# Patient Record
Sex: Male | Born: 1935 | Race: White | Hispanic: No | Marital: Married | Smoking: Never smoker
Health system: Southern US, Community
[De-identification: ages and names within clinical notes are randomized; demographics above are authoritative.]

## PROBLEM LIST (undated history)

## (undated) ENCOUNTER — Inpatient Hospital Stay: Admission: EM | Payer: Self-pay | Source: Home / Self Care

## (undated) DIAGNOSIS — K219 Gastro-esophageal reflux disease without esophagitis: Secondary | ICD-10-CM

## (undated) DIAGNOSIS — H9190 Unspecified hearing loss, unspecified ear: Secondary | ICD-10-CM

## (undated) DIAGNOSIS — E785 Hyperlipidemia, unspecified: Secondary | ICD-10-CM

## (undated) DIAGNOSIS — I499 Cardiac arrhythmia, unspecified: Secondary | ICD-10-CM

## (undated) DIAGNOSIS — G250 Essential tremor: Secondary | ICD-10-CM

## (undated) DIAGNOSIS — M1990 Unspecified osteoarthritis, unspecified site: Secondary | ICD-10-CM

## (undated) DIAGNOSIS — J029 Acute pharyngitis, unspecified: Secondary | ICD-10-CM

## (undated) DIAGNOSIS — I6529 Occlusion and stenosis of unspecified carotid artery: Secondary | ICD-10-CM

## (undated) DIAGNOSIS — N400 Enlarged prostate without lower urinary tract symptoms: Secondary | ICD-10-CM

## (undated) HISTORY — PX: CATARACT EXTRACTION: SUR2

## (undated) HISTORY — DX: Hyperlipidemia, unspecified: E78.5

## (undated) HISTORY — PX: TOTAL KNEE ARTHROPLASTY: SHX125

## (undated) HISTORY — PX: TONSILLECTOMY: SUR1361

## (undated) HISTORY — DX: Unspecified osteoarthritis, unspecified site: M19.90

## (undated) HISTORY — DX: Occlusion and stenosis of unspecified carotid artery: I65.29

## (undated) HISTORY — DX: Acute pharyngitis, unspecified: J02.9

## (undated) HISTORY — DX: Benign prostatic hyperplasia without lower urinary tract symptoms: N40.0

## (undated) HISTORY — DX: Essential tremor: G25.0

## (undated) NOTE — *Deleted (*Deleted)
Stat EKG obtained

---

## 2000-11-26 ENCOUNTER — Encounter: Admission: RE | Admit: 2000-11-26 | Discharge: 2000-11-26 | Payer: Self-pay | Admitting: Internal Medicine

## 2002-11-14 ENCOUNTER — Encounter: Payer: Self-pay | Admitting: *Deleted

## 2003-09-15 ENCOUNTER — Ambulatory Visit (HOSPITAL_COMMUNITY): Admission: RE | Admit: 2003-09-15 | Discharge: 2003-09-15 | Payer: Self-pay | Admitting: Internal Medicine

## 2004-08-23 ENCOUNTER — Ambulatory Visit: Payer: Self-pay | Admitting: Cardiology

## 2007-02-26 ENCOUNTER — Ambulatory Visit: Payer: Self-pay | Admitting: Internal Medicine

## 2007-02-26 ENCOUNTER — Ambulatory Visit (HOSPITAL_COMMUNITY): Admission: RE | Admit: 2007-02-26 | Discharge: 2007-02-26 | Payer: Self-pay | Admitting: Internal Medicine

## 2011-02-21 NOTE — Op Note (Signed)
NAME:  Jeffery Smith, Jeffery Smith NO.:  [id number]   MEDICAL RECORD NO.:  [id number]          PATIENT TYPE:  AMB   LOCATION:  DAY                           FACILITY:  [facility]   PHYSICIAN:  Lionel December, M.D.    DATE OF BIRTH:  04-26-1925   DATE OF PROCEDURE:  02/26/2007  DATE OF DISCHARGE:  02/26/2007                               OPERATIVE REPORT   PROCEDURE:  Colonoscopy.   INDICATIONS:  Jeffery Smith is a 77 year old Caucasian male with a first  surveillance colonoscopy.  He had an exam in December, 2004 with removal  of large tubulovillous adenoma.  Family history is negative for  colorectal carcinoma.  The procedural risks were reviewed with the  patient, and informed consent was obtained.   MEDS FOR CONSCIOUS SEDATION:  Demerol 50 mg IV, Versed 3 mg IV.   FINDINGS:  Procedure performed in endoscopy suite.  Patient's vital  signs and O2 sat were monitored during the procedure and remained  stable.  Patient was placed in the left lateral decubitus position and  rectal examination performed.  No abnormality noted on external or  digital exam.  Pentax videoscope was placed in the rectum and advanced  under direct vision into the sigmoid colon and beyond.  Preparation was  excellent.  The scope was passed at the cecum.  The blind cecum was  carefully examined, the site where previous polypectomy had been  performed.  It was clean and free of any polyp.  Pictures were taken for  the record.  Pictures also taken of the appendiceal orifice and  ileocecal valve.  As the scope was withdrawn, colonic mucosa was  carefully examined and was normal throughout.  Rectal mucosa similarly  was normal.  The scope was retroflexed to examine anorectal junction.  There was some focal thickening to mucosa and the dentate line,  otherwise normal.  The colonic mucosa was carefully examined.  It was  normal throughout.  There were a few small diverticula noted in the  sigmoid colon.  Rectal mucosa was  normal.  The scope was retroflexed to  examine anorectal junction, and he had small hemorrhoids below the  dentate line.  The endoscope was straightened and withdrawn.  Patient  tolerated the procedure well.   FINAL DIAGNOSES:  1. No evidence of recurrent polyps.  2. A few diverticula at the sigmoid colon, small external hemorrhoids.   RECOMMENDATIONS:  Patient may consider next exam in five years from now.      Lionel December, M.D.  Electronically Signed     NR/MEDQ  D:  03/12/2007  T:  03/12/2007  Job:  [id number]

## 2011-02-24 NOTE — Op Note (Signed)
NAME:  Jeffery Smith, Jeffery Smith                    ACCOUNT NO.:  [id number]   MEDICAL RECORD NO.:  [id number]                   PATIENT TYPE:  AMB   LOCATION:  DAY                                  FACILITY:  [facility]   PHYSICIAN:  Lionel December, M.D.                 DATE OF BIRTH:  09-06-1925   DATE OF PROCEDURE:  09/15/2003  DATE OF DISCHARGE:                                 OPERATIVE REPORT   PROCEDURE:  Total colonoscopy with polypectomy.   INDICATIONS FOR PROCEDURE:  Merlyn Albert is a 77 year old Caucasian male who is  undergoing screening colonoscopy.  The procedure and risks were reviewed  with the patient, and informed consent was obtained.   PREOPERATIVE MEDICATIONS:  Demerol 15 mg IV, atropine 0.3 mg IV, and Versed  3 mg IV.   FINDINGS:  The procedure was performed in the endoscopy suite.  The  patient's vital signs and O2 saturations were monitored during the  procedure.  He had intermittent single PVCs.  He had a transient  asymptomatic drop in his blood pressure, and he was given a small dose of  atropine to prevent vasovagal phenomenon.  The patient was placed in the  left lateral recumbent position and rectal examination performed.  No  abnormality noted on external or digital exam.  The Olympus videoscope was  placed into the rectum and advanced into the region of the sigmoid colon.  Some difficulty was encountered in passing the scope from the sigmoid to the  descending colon.  Further intubation was easy.  The preparation was  satisfactory.  A few small diverticula were noted at the sigmoid colon.  The  scope was passed to the cecum which was identified by the ileocecal valve  and appendiceal stump.  There was a sessile polyp at blunt end of the cecum  about 6 x 12 mm.  This was raised with submucosal injection of normal saline  and snared.  There was another polyp above that which was small and was  coagulated using snare tip.  A third polyp was at the ascending colon and  was  coagulated using snare tip.  There was another polyp which appeared to  have two parts.  It was sessile, small, and it was raised with submucosal  injection of normal saline and snared.  The mucosa of the rest of the colon  was normal.  The rectal mucosa similarly was normal.  The scope was  retroflexed to examine the anorectal junction which was unremarkable.  The  endoscope was straightened and withdrawn.  The patient tolerated the  procedure well.   FINAL DIAGNOSES:  1. A few scattered diverticula at sigmoid colon.  2. Four polyps.  The largest one was as the cecum and was about 6 x 12 mm     which was snared after raising with a submucosal injection of normal     saline.  Another one from hepatic flexure was snared in  similar fashion.     Two smaller polyps were coagulated, one at the cecum and one at the     ascending colon.   RECOMMENDATIONS:  1. Standard instructions given.  2. High fiber diet.  3. I will be contacting the patient with the biopsy results and further     recommendations.      ___________________________________________                                            Lionel December, M.D.   NR/MEDQ  D:  09/15/2003  T:  09/15/2003  Job:  [id number]   cc:   Kingsley Callander. Ouida Sills, M.D.  [address]  [place]  [place] [address]  Fax: [phone]

## 2011-07-28 ENCOUNTER — Encounter: Payer: Self-pay | Admitting: Vascular Surgery

## 2011-08-07 ENCOUNTER — Encounter: Payer: Self-pay | Admitting: Vascular Surgery

## 2011-08-08 ENCOUNTER — Other Ambulatory Visit: Payer: Medicare HMO

## 2011-08-08 ENCOUNTER — Encounter: Payer: Self-pay | Admitting: Vascular Surgery

## 2011-08-08 ENCOUNTER — Ambulatory Visit (INDEPENDENT_AMBULATORY_CARE_PROVIDER_SITE_OTHER): Payer: Medicare HMO | Admitting: Vascular Surgery

## 2011-08-08 VITALS — BP 150/69 | HR 45 | Resp 16 | Ht 73.0 in | Wt 180.0 lb

## 2011-08-08 DIAGNOSIS — I6529 Occlusion and stenosis of unspecified carotid artery: Secondary | ICD-10-CM

## 2011-08-08 NOTE — Progress Notes (Signed)
The patient presents today for evaluation of severe asymptomatic right carotid stenosis. This has been followed at the [facility] hospital for several years with progression. I have his studies from the a hospital and this does suggest greater than 80% right internal carotid artery stenosis. The patient has had no symptoms referable to this. Specifically he has had no amaurosis fugax transient ischemic attack or stroke. He does have a history of knee replacement and is contemplating a right knee replacement. He is not have any major medical difficulties specifically no cardiac disease. He does not smoke and has 1 glass of 1 daily.  Past Medical History  Diagnosis Date  . Hypertension   . Arthritis   . Carotid artery occlusion   . Sore throat   . Hyperlipidemia   . Benign head tremor   . Enlarged prostate     History  Substance Use Topics  . Smoking status: Never Smoker   . Smokeless tobacco: Not on file  . Alcohol Use: Yes     1 glass of wine daily    History reviewed. No pertinent family history.  No Known Allergies  Current outpatient prescriptions:aspirin EC 81 MG tablet, Take 325 mg by mouth daily. , Disp: , Rfl: ;  finasteride (PROSCAR) 5 MG tablet, Take 5 mg by mouth daily.  , Disp: , Rfl: ;  GLUCOSAMINE HCL PO, Take by mouth.  , Disp: , Rfl: ;  ibuprofen (ADVIL,MOTRIN) 400 MG tablet, Take 400 mg by mouth 3 (three) times daily.  , Disp: , Rfl: ;  loratadine (CLARITIN) 10 MG tablet, Take 10 mg by mouth daily.  , Disp: , Rfl:  omeprazole (PRILOSEC) 20 MG capsule, Take 20 mg by mouth daily.  , Disp: , Rfl: ;  Simvastatin (ZOCOR PO), Take by mouth daily.  , Disp: , Rfl: ;  Tamsulosin HCl (FLOMAX) 0.4 MG CAPS, Take by mouth daily.  , Disp: , Rfl: ;  Saw Palmetto, Serenoa repens, (SAW PALMETTO PO), Take by mouth.  , Disp: , Rfl:   BP 150/69  Pulse 45  Resp 16  Ht 6\' 1"  (1.854 m)  Wt 180 lb (81.647 kg)  BMI 23.75 kg/m2  Body mass index is 23.75 kg/(m^2).       Review of systems:  Cardiac arrhythmia, occasional dizziness from neurologic standpoint, urinary frequency due to prostate disease, arthritis otherwise review of systems negative  Physical exam: Well-developed well-nourished white male appearing stated age in no acute distress. HEENT normal. Carotid arteries without bruits bilaterally. Heart regular rate and rhythm. Chest clear bilaterally. 2+ radial 2+ femoral and 2+ dorsalis pedis pulses bilaterally. Abdomen soft nontender no masses noted. Neurologically grossly intact. Psychiatric normal affect.  Impression and plan: Severe right internal carotid artery stenosis, asymptomatic. Discuss this at length with the patient and his wife present. I have recommended repeat carotid duplex since his most recent study was in June at the [facility]. I did explain that if indeed he does have a severe stenosis by our duplex I would recommend endarterectomy for reduction of stroke risk. I explained the procedure and 1-2% risk of stroke with surgery. We were unable to and or vascular lab schedule today so we will see him back in several weeks at his convenience for repeat carotid duplex and further discussion.

## 2011-08-14 ENCOUNTER — Encounter: Payer: Self-pay | Admitting: Vascular Surgery

## 2011-08-15 ENCOUNTER — Encounter: Payer: Self-pay | Admitting: Vascular Surgery

## 2011-08-15 ENCOUNTER — Ambulatory Visit (INDEPENDENT_AMBULATORY_CARE_PROVIDER_SITE_OTHER): Payer: Medicare HMO | Admitting: *Deleted

## 2011-08-15 ENCOUNTER — Ambulatory Visit (INDEPENDENT_AMBULATORY_CARE_PROVIDER_SITE_OTHER): Payer: Medicare HMO | Admitting: Vascular Surgery

## 2011-08-15 VITALS — BP 125/49 | HR 64 | Resp 20 | Ht 72.0 in | Wt 180.0 lb

## 2011-08-15 DIAGNOSIS — I6529 Occlusion and stenosis of unspecified carotid artery: Secondary | ICD-10-CM

## 2011-08-15 NOTE — Progress Notes (Signed)
The patient presents today for one week followup carotid duplex imaging. He had been seen for evaluation of asymptomatic right internal carotid artery stenosis. He had been followed with serial duplex examinations at the [facility]. He continues to be asymptomatic. Physical exam past history review of systems are all unchanged from one week ago. His carotid arteries are without bruits bilaterally.  Vascular lab: 80% right internal carotid artery stenosis no significant left internal carotid artery stenosis.  Impression and plan: I had a long discussion with the patient. I have recommended right carotid endarterectomy for reduction of stroke risk 1-2% risk of stroke with surgery. He understands and wishes to proceed. 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 He wishes to defer his surgery until 11/03/2011. I feel this is appropriate unless he develops symptoms in the meantime. He will notify us immediate should this occur.

## 2011-08-23 NOTE — Procedures (Unsigned)
CAROTID DUPLEX EXAM  INDICATION:  Carotid stenosis  HISTORY: Diabetes:  No Cardiac:  No Hypertension:  No Smoking:  No Previous Surgery:  No CV History:  Occasional dizziness Amaurosis Fugax No, Paresthesias No, Hemiparesis No                                      RIGHT             LEFT Brachial systolic pressure:         143               147 Brachial Doppler waveforms:         Normal            Normal Vertebral direction of flow:        Antegrade         Antegrade DUPLEX VELOCITIES (cm/sec) CCA peak systolic                   63                68 ECA peak systolic                   53                57 ICA peak systolic                   312               113 ICA end diastolic                   101               38 PLAQUE MORPHOLOGY:                  Mixed             Mixed PLAQUE AMOUNT:                      Moderate / severe Minimal PLAQUE LOCATION:                    ICA, ECA, bifurcation               ICA, bifurcation  IMPRESSION: 1. Right internal carotid artery velocity suggests 60%-79% stenosis     (high end of range). 2. Left internal carotid artery velocity suggests a 1%-39% stenosis     (high end of range). 3. Antegrade vertebral arteries bilaterally.  ___________________________________________ Larina Earthly, M.D.  EM/MEDQ  D:  08/15/2011  T:  08/15/2011  Job:  [id number]

## 2011-10-12 ENCOUNTER — Other Ambulatory Visit: Payer: Self-pay | Admitting: *Deleted

## 2011-10-12 ENCOUNTER — Encounter: Payer: Self-pay | Admitting: *Deleted

## 2011-10-25 ENCOUNTER — Encounter (HOSPITAL_COMMUNITY): Payer: Self-pay

## 2011-10-25 ENCOUNTER — Ambulatory Visit (HOSPITAL_COMMUNITY)
Admission: RE | Admit: 2011-10-25 | Discharge: 2011-10-25 | Disposition: A | Discharge status: Home / Self Care | Payer: Medicare HMO | Source: Ambulatory Visit | Attending: Anesthesiology | Admitting: Anesthesiology

## 2011-10-25 ENCOUNTER — Encounter (HOSPITAL_COMMUNITY)
Admission: RE | Admit: 2011-10-25 | Discharge: 2011-10-25 | Disposition: A | Discharge status: Home / Self Care | Payer: Medicare HMO | Source: Ambulatory Visit | Attending: Vascular Surgery | Admitting: Vascular Surgery

## 2011-10-25 ENCOUNTER — Ambulatory Visit (HOSPITAL_COMMUNITY): Admission: RE | Admit: 2011-10-25 | Payer: Medicare HMO | Source: Ambulatory Visit

## 2011-10-25 ENCOUNTER — Other Ambulatory Visit: Payer: Self-pay

## 2011-10-25 DIAGNOSIS — Z01810 Encounter for preprocedural cardiovascular examination: Secondary | ICD-10-CM | POA: Insufficient documentation

## 2011-10-25 DIAGNOSIS — Z01812 Encounter for preprocedural laboratory examination: Secondary | ICD-10-CM | POA: Insufficient documentation

## 2011-10-25 DIAGNOSIS — Z01818 Encounter for other preprocedural examination: Secondary | ICD-10-CM | POA: Insufficient documentation

## 2011-10-25 DIAGNOSIS — I517 Cardiomegaly: Secondary | ICD-10-CM | POA: Insufficient documentation

## 2011-10-25 HISTORY — DX: Gastro-esophageal reflux disease without esophagitis: K21.9

## 2011-10-25 LAB — TYPE AND SCREEN: ABO/RH(D): A POS

## 2011-10-25 LAB — DIFFERENTIAL
Basophils Absolute: 0 10*3/uL (ref 0.0–0.1)
Lymphocytes Relative: 22 % (ref 12–46)
Lymphs Abs: 1.6 10*3/uL (ref 0.7–4.0)
Monocytes Absolute: 0.7 10*3/uL (ref 0.1–1.0)
Neutro Abs: 4.7 10*3/uL (ref 1.7–7.7)

## 2011-10-25 LAB — URINALYSIS, ROUTINE W REFLEX MICROSCOPIC
Ketones, ur: NEGATIVE mg/dL
Leukocytes, UA: NEGATIVE
Nitrite: NEGATIVE
Protein, ur: NEGATIVE mg/dL
pH: 5.5 (ref 5.0–8.0)

## 2011-10-25 LAB — COMPREHENSIVE METABOLIC PANEL
ALT: 14 U/L (ref 0–53)
AST: 20 U/L (ref 0–37)
CO2: 25 mEq/L (ref 19–32)
Calcium: 9.2 mg/dL (ref 8.4–10.5)
Creatinine, Ser: 1.26 mg/dL (ref 0.50–1.35)
GFR calc non Af Amer: 51 mL/min — ABNORMAL LOW (ref >90)
Sodium: 140 mEq/L (ref 135–145)
Total Protein: 6.5 g/dL (ref 6.0–8.3)

## 2011-10-25 LAB — CBC
MCH: 31.2 pg (ref 26.0–34.0)
MCHC: 33.9 g/dL (ref 30.0–36.0)
MCV: 92.1 fL (ref 78.0–100.0)
Platelets: 160 10*3/uL (ref 150–400)
RDW: 13.3 % (ref 11.5–15.5)

## 2011-10-25 LAB — PROTIME-INR: Prothrombin Time: 13 seconds (ref 11.6–15.2)

## 2011-10-25 MED ORDER — DEXTROSE 5 % IV SOLN: 1.5000 g | INTRAVENOUS | Status: DC

## 2011-10-25 NOTE — Pre-Procedure Instructions (Signed)
20 Jeffery Smith  10/25/2011   Your procedure is scheduled on:  Nov 03, 2011 (Friday)  Report to [facility] at 0530 AM.  Call this number if you have problems the morning of surgery: [phone]   Remember:   Do not eat food:After Midnight.  May have clear liquids: up to 4 Hours before arrival.(1:30 AM)  Clear liquids include soda, tea, black coffee, apple or grape juice, broth.  Take these medicines the morning of surgery with A SIP OF WATER:proscar,claritin,prilosec, simvistatin,flomax, stop aspirin   Do not wear jewelry, make-up or nail polish.  Do not wear lotions, powders, or perfumes. You may wear deodorant.  Do not shave 48 hours prior to surgery.  Do not bring valuables to the hospital.  Contacts, dentures or bridgework may not be worn into surgery.  Leave suitcase in the car. After surgery it may be brought to your room.  For patients admitted to the hospital, checkout time is 11:00 AM the day of discharge.   Patients discharged the day of surgery will not be allowed to drive home.  Name and phone number of your driver: NA  Special Instructions: CHG Shower Use Special Wash: 1/2 bottle night before surgery and 1/2 bottle morning of surgery.   Please read over the following fact sheets that you were given: Pain Booklet, Blood Transfusion Information and Surgical Site Infection Prevention

## 2011-10-26 NOTE — Consult Note (Addendum)
Anesthesia:  Patient is a 77 year old male scheduled for a right CEA on 11/03/11.  His history includes BPH, HLD, GERD, benign tremor, rheumatic fever.    CXR shows borderline cardiac silhouette enlargement. Mild hyperinflation configuration suggests element of COPD. No pulmonary edema, pneumonia, or other acute abnormality is evident.  Labs acceptable.  EKG shows marked SB with frequent PVCs, right BBB, LAFB, bifascicular block.  I do not see that he currently sees a Cardiologist.  We did receive records from [facility] from 2005 when he was evaluated by [facility] for frequent multi-focal PVCs post-op following a TKR.  An echo was done, he was started on Toprol, and an out patient stress test was recommended.  The echo in 2005 showed good overall LV function with an EF "in the normal range".  There was question of mild right heart enlargement.  His EKG from 2005 also shows a right BBB and LAFB.  I called an spoke with Jeffery Smith.  He is primarily seen at the [facility] by Dr. Alexia Freestone.  He is only rarely seen by Dr. Carylon Perches.  He does not remember if he ever had a stress test performed.  He does not think that he has had another echo since 2005.  He denies CP/SOB/edema.  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  I will contact [facility] tomorrow (office is currently closed) to see if they have any other Cardiac records to send then review above with one of our Anesthesiologists.  Addendum: 10/27/11 1100  [facility] medical records does not have any additional records or test on Jeffery Smith.  I reviewed his cardiac history with Anesthesiologist Dr. Katrinka Blazing.  Since his EKG is stable, he's asymptomatic, and stays fairly active, plan to proceed.

## 2011-11-02 MED ORDER — DEXTROSE 5 % IV SOLN
1.5000 g | INTRAVENOUS | Status: AC
  Administered 2011-11-03: 1.5 g via INTRAVENOUS
  Filled 2011-11-02: qty 1.5

## 2011-11-02 MED ORDER — SODIUM CHLORIDE 0.9 % IV SOLN: INTRAVENOUS | Status: DC

## 2011-11-03 ENCOUNTER — Encounter (HOSPITAL_COMMUNITY)
Admission: RE | Disposition: A | Discharge status: Home / Self Care | Payer: Self-pay | Source: Ambulatory Visit | Attending: Vascular Surgery

## 2011-11-03 ENCOUNTER — Encounter (HOSPITAL_COMMUNITY): Payer: Self-pay | Admitting: Vascular Surgery

## 2011-11-03 ENCOUNTER — Encounter (HOSPITAL_COMMUNITY): Payer: Self-pay | Admitting: *Deleted

## 2011-11-03 ENCOUNTER — Other Ambulatory Visit: Payer: Self-pay | Admitting: Vascular Surgery

## 2011-11-03 ENCOUNTER — Inpatient Hospital Stay (HOSPITAL_COMMUNITY)
Admission: RE | Admit: 2011-11-03 | Discharge: 2011-11-04 | DRG: 039 | Disposition: A | Discharge status: Home / Self Care | Payer: Medicare HMO | Source: Ambulatory Visit | Attending: Vascular Surgery | Admitting: Vascular Surgery

## 2011-11-03 ENCOUNTER — Ambulatory Visit (HOSPITAL_COMMUNITY): Payer: Medicare HMO | Admitting: Vascular Surgery

## 2011-11-03 DIAGNOSIS — Z96659 Presence of unspecified artificial knee joint: Secondary | ICD-10-CM

## 2011-11-03 DIAGNOSIS — G252 Other specified forms of tremor: Secondary | ICD-10-CM | POA: Diagnosis present

## 2011-11-03 DIAGNOSIS — I6529 Occlusion and stenosis of unspecified carotid artery: Secondary | ICD-10-CM

## 2011-11-03 DIAGNOSIS — K219 Gastro-esophageal reflux disease without esophagitis: Secondary | ICD-10-CM | POA: Diagnosis present

## 2011-11-03 DIAGNOSIS — G250 Essential tremor: Secondary | ICD-10-CM | POA: Diagnosis present

## 2011-11-03 DIAGNOSIS — Z888 Allergy status to other drugs, medicaments and biological substances status: Secondary | ICD-10-CM

## 2011-11-03 DIAGNOSIS — I6521 Occlusion and stenosis of right carotid artery: Secondary | ICD-10-CM

## 2011-11-03 DIAGNOSIS — Z7982 Long term (current) use of aspirin: Secondary | ICD-10-CM

## 2011-11-03 DIAGNOSIS — E785 Hyperlipidemia, unspecified: Secondary | ICD-10-CM | POA: Diagnosis present

## 2011-11-03 HISTORY — PX: ENDARTERECTOMY: SHX5162

## 2011-11-03 SURGERY — ENDARTERECTOMY, CAROTID
Anesthesia: General | Site: Neck | Laterality: Right | Wound class: Clean

## 2011-11-03 MED ORDER — ROSUVASTATIN CALCIUM 20 MG PO TABS
20.0000 mg | ORAL_TABLET | Freq: Every day | ORAL | Status: DC
  Administered 2011-11-04: 20 mg via ORAL
  Filled 2011-11-03: qty 1

## 2011-11-03 MED ORDER — LORATADINE 10 MG PO TABS
10.0000 mg | ORAL_TABLET | Freq: Every day | ORAL | Status: DC
  Administered 2011-11-04: 10 mg via ORAL
  Filled 2011-11-03: qty 1

## 2011-11-03 MED ORDER — MORPHINE SULFATE 2 MG/ML IJ SOLN
INTRAMUSCULAR | Status: AC
  Filled 2011-11-03: qty 1

## 2011-11-03 MED ORDER — DOPAMINE-DEXTROSE 3.2-5 MG/ML-% IV SOLN
3.0000 ug/kg/min | INTRAVENOUS | Status: DC
Start: 2011-11-03 — End: 2011-11-03

## 2011-11-03 MED ORDER — EPHEDRINE SULFATE 50 MG/ML IJ SOLN
INTRAMUSCULAR | Status: DC | PRN
  Administered 2011-11-03: 10 mg via INTRAVENOUS

## 2011-11-03 MED ORDER — SENNOSIDES-DOCUSATE SODIUM 8.6-50 MG PO TABS
1.0000 | ORAL_TABLET | Freq: Every evening | ORAL | Status: DC | PRN
  Filled 2011-11-03: qty 1

## 2011-11-03 MED ORDER — LABETALOL HCL 5 MG/ML IV SOLN: 10.0000 mg | INTRAVENOUS | Status: DC | PRN

## 2011-11-03 MED ORDER — GUAIFENESIN-DM 100-10 MG/5ML PO SYRP: 15.0000 mL | ORAL_SOLUTION | ORAL | Status: DC | PRN

## 2011-11-03 MED ORDER — NEOSTIGMINE METHYLSULFATE 1 MG/ML IJ SOLN
INTRAMUSCULAR | Status: DC | PRN
  Administered 2011-11-03: 3 mg via INTRAVENOUS

## 2011-11-03 MED ORDER — OXYCODONE HCL 5 MG PO TABS: 5.0000 mg | ORAL_TABLET | ORAL | Status: DC | PRN

## 2011-11-03 MED ORDER — PANTOPRAZOLE SODIUM 40 MG PO TBEC: 40.0000 mg | DELAYED_RELEASE_TABLET | Freq: Every day | ORAL | Status: DC

## 2011-11-03 MED ORDER — MORPHINE SULFATE 2 MG/ML IJ SOLN: 2.0000 mg | INTRAMUSCULAR | Status: DC | PRN

## 2011-11-03 MED ORDER — HETASTARCH-ELECTROLYTES 6 % IV SOLN
500.0000 mL | Freq: Once | INTRAVENOUS | Status: AC
  Administered 2011-11-03: 500 mL via INTRAVENOUS

## 2011-11-03 MED ORDER — POTASSIUM CHLORIDE CRYS ER 20 MEQ PO TBCR: 20.0000 meq | EXTENDED_RELEASE_TABLET | Freq: Once | ORAL | Status: DC | PRN

## 2011-11-03 MED ORDER — HYDRALAZINE HCL 20 MG/ML IJ SOLN
10.0000 mg | INTRAMUSCULAR | Status: DC | PRN
  Filled 2011-11-03: qty 0.5

## 2011-11-03 MED ORDER — ACETAMINOPHEN 325 MG PO TABS: 325.0000 mg | ORAL_TABLET | ORAL | Status: DC | PRN

## 2011-11-03 MED ORDER — ONDANSETRON HCL 4 MG/2ML IJ SOLN: 4.0000 mg | Freq: Four times a day (QID) | INTRAMUSCULAR | Status: DC | PRN

## 2011-11-03 MED ORDER — FAMOTIDINE IN NACL 20-0.9 MG/50ML-% IV SOLN
20.0000 mg | Freq: Two times a day (BID) | INTRAVENOUS | Status: DC
  Administered 2011-11-03 – 2011-11-04 (×2): 20 mg via INTRAVENOUS
  Filled 2011-11-03 (×3): qty 50

## 2011-11-03 MED ORDER — SODIUM CHLORIDE 0.9 % IV SOLN: 500.0000 mL | Freq: Once | INTRAVENOUS | Status: AC | PRN

## 2011-11-03 MED ORDER — 0.9 % SODIUM CHLORIDE (POUR BTL) OPTIME
TOPICAL | Status: DC | PRN
  Administered 2011-11-03: 1000 mL

## 2011-11-03 MED ORDER — DEXTROSE 5 % IV SOLN: 1.5000 g | Freq: Two times a day (BID) | INTRAVENOUS | Status: DC

## 2011-11-03 MED ORDER — PROPOFOL 10 MG/ML IV EMUL
INTRAVENOUS | Status: DC | PRN
  Administered 2011-11-03: 110 mg via INTRAVENOUS

## 2011-11-03 MED ORDER — METOPROLOL TARTRATE 1 MG/ML IV SOLN: 2.0000 mg | INTRAVENOUS | Status: DC | PRN

## 2011-11-03 MED ORDER — ASPIRIN EC 325 MG PO TBEC
325.0000 mg | DELAYED_RELEASE_TABLET | Freq: Every day | ORAL | Status: DC
  Administered 2011-11-04: 325 mg via ORAL
  Filled 2011-11-03: qty 1

## 2011-11-03 MED ORDER — ROCURONIUM BROMIDE 100 MG/10ML IV SOLN
INTRAVENOUS | Status: DC | PRN
  Administered 2011-11-03: 50 mg via INTRAVENOUS

## 2011-11-03 MED ORDER — OXYCODONE HCL 5 MG PO TABS: 5.0000 mg | ORAL_TABLET | Freq: Four times a day (QID) | ORAL | Status: AC | PRN

## 2011-11-03 MED ORDER — PROTAMINE SULFATE 10 MG/ML IV SOLN
INTRAVENOUS | Status: DC | PRN
  Administered 2011-11-03: 50 mg via INTRAVENOUS

## 2011-11-03 MED ORDER — MAGNESIUM SULFATE 40 MG/ML IJ SOLN
2.0000 g | Freq: Once | INTRAMUSCULAR | Status: DC | PRN
  Filled 2011-11-03: qty 50

## 2011-11-03 MED ORDER — LACTATED RINGERS IV SOLN
INTRAVENOUS | Status: DC | PRN
  Administered 2011-11-03 (×2): via INTRAVENOUS

## 2011-11-03 MED ORDER — FENTANYL CITRATE 0.05 MG/ML IJ SOLN
INTRAMUSCULAR | Status: DC | PRN
  Administered 2011-11-03: 150 ug via INTRAVENOUS

## 2011-11-03 MED ORDER — SODIUM CHLORIDE 0.9 % IR SOLN
Status: DC | PRN
  Administered 2011-11-03: 09:00:00

## 2011-11-03 MED ORDER — SODIUM CHLORIDE 0.9 % IV SOLN: INTRAVENOUS | Status: DC

## 2011-11-03 MED ORDER — LIDOCAINE HCL 4 % MT SOLN
OROMUCOSAL | Status: DC | PRN
  Administered 2011-11-03: 4 mL via TOPICAL

## 2011-11-03 MED ORDER — PROMETHAZINE HCL 25 MG/ML IJ SOLN: 6.2500 mg | INTRAMUSCULAR | Status: DC | PRN

## 2011-11-03 MED ORDER — MEPERIDINE HCL 25 MG/ML IJ SOLN: 6.2500 mg | INTRAMUSCULAR | Status: DC | PRN

## 2011-11-03 MED ORDER — ASPIRIN 325 MG PO TABS: 325.0000 mg | ORAL_TABLET | Freq: Every day | ORAL | Status: DC

## 2011-11-03 MED ORDER — HETASTARCH-ELECTROLYTES 6 % IV SOLN
INTRAVENOUS | Status: AC
  Filled 2011-11-03: qty 500

## 2011-11-03 MED ORDER — ACETAMINOPHEN 325 MG PO TABS
325.0000 mg | ORAL_TABLET | ORAL | Status: DC | PRN
  Administered 2011-11-03: 650 mg via ORAL
  Filled 2011-11-03: qty 2

## 2011-11-03 MED ORDER — DOCUSATE SODIUM 100 MG PO CAPS: 100.0000 mg | ORAL_CAPSULE | Freq: Every day | ORAL | Status: DC

## 2011-11-03 MED ORDER — FLEET ENEMA 7-19 GM/118ML RE ENEM: 1.0000 | ENEMA | Freq: Once | RECTAL | Status: AC | PRN

## 2011-11-03 MED ORDER — BISACODYL 5 MG PO TBEC: 5.0000 mg | DELAYED_RELEASE_TABLET | Freq: Every day | ORAL | Status: DC | PRN

## 2011-11-03 MED ORDER — GLYCOPYRROLATE 0.2 MG/ML IJ SOLN
INTRAMUSCULAR | Status: DC | PRN
  Administered 2011-11-03: .4 mg via INTRAVENOUS

## 2011-11-03 MED ORDER — FAMOTIDINE IN NACL 20-0.9 MG/50ML-% IV SOLN
20.0000 mg | Freq: Two times a day (BID) | INTRAVENOUS | Status: DC
  Filled 2011-11-03: qty 50

## 2011-11-03 MED ORDER — ACETAMINOPHEN 650 MG RE SUPP: 325.0000 mg | RECTAL | Status: DC | PRN

## 2011-11-03 MED ORDER — HEPARIN SODIUM (PORCINE) 1000 UNIT/ML IJ SOLN
INTRAMUSCULAR | Status: DC | PRN
  Administered 2011-11-03: 8000 [IU] via INTRAVENOUS

## 2011-11-03 MED ORDER — DOPAMINE-DEXTROSE 3.2-5 MG/ML-% IV SOLN: 3.0000 ug/kg/min | INTRAVENOUS | Status: DC

## 2011-11-03 MED ORDER — MAGNESIUM SULFATE 40 MG/ML IJ SOLN
2.0000 g | Freq: Once | INTRAMUSCULAR | Status: AC | PRN
  Filled 2011-11-03: qty 50

## 2011-11-03 MED ORDER — DEXTROSE 5 % IV SOLN
1.5000 g | Freq: Two times a day (BID) | INTRAVENOUS | Status: AC
  Administered 2011-11-03 – 2011-11-04 (×2): 1.5 g via INTRAVENOUS
  Filled 2011-11-03 (×2): qty 1.5

## 2011-11-03 MED ORDER — HYDROMORPHONE HCL PF 1 MG/ML IJ SOLN
0.2500 mg | INTRAMUSCULAR | Status: DC | PRN
  Administered 2011-11-03: 0.25 mg via INTRAVENOUS

## 2011-11-03 MED ORDER — SODIUM CHLORIDE 0.9 % IV SOLN: 500.0000 mL | Freq: Once | INTRAVENOUS | Status: DC | PRN

## 2011-11-03 MED ORDER — DOPAMINE-DEXTROSE 3.2-5 MG/ML-% IV SOLN
3.0000 ug/kg/min | INTRAVENOUS | Status: DC
  Administered 2011-11-03: 3 ug/kg/min via INTRAVENOUS
  Filled 2011-11-03: qty 250

## 2011-11-03 MED ORDER — PHENOL 1.4 % MT LIQD: 1.0000 | OROMUCOSAL | Status: DC | PRN

## 2011-11-03 MED ORDER — ONDANSETRON HCL 4 MG/2ML IJ SOLN
INTRAMUSCULAR | Status: DC | PRN
  Administered 2011-11-03: 4 mg via INTRAVENOUS

## 2011-11-03 MED ORDER — POTASSIUM CHLORIDE CRYS ER 20 MEQ PO TBCR: 20.0000 meq | EXTENDED_RELEASE_TABLET | Freq: Once | ORAL | Status: AC | PRN

## 2011-11-03 MED ORDER — TAMSULOSIN HCL 0.4 MG PO CAPS
0.4000 mg | ORAL_CAPSULE | Freq: Every day | ORAL | Status: DC
  Administered 2011-11-04: 0.4 mg via ORAL
  Filled 2011-11-03: qty 1

## 2011-11-03 MED ORDER — FINASTERIDE 5 MG PO TABS
5.0000 mg | ORAL_TABLET | Freq: Every day | ORAL | Status: DC
  Administered 2011-11-04: 5 mg via ORAL
  Filled 2011-11-03: qty 1

## 2011-11-03 MED ORDER — DOCUSATE SODIUM 100 MG PO CAPS
100.0000 mg | ORAL_CAPSULE | Freq: Every day | ORAL | Status: DC
  Administered 2011-11-04: 100 mg via ORAL
  Filled 2011-11-03: qty 1

## 2011-11-03 MED ORDER — SODIUM CHLORIDE 0.9 % IV SOLN
10.0000 mg | INTRAVENOUS | Status: DC | PRN
  Administered 2011-11-03: 5 ug/min via INTRAVENOUS

## 2011-11-03 MED ORDER — ONDANSETRON HCL 4 MG/2ML IJ SOLN
4.0000 mg | Freq: Four times a day (QID) | INTRAMUSCULAR | Status: DC | PRN
  Filled 2011-11-03: qty 2

## 2011-11-03 SURGICAL SUPPLY — 46 items
APL SKNCLS STERI-STRIP NONHPOA (GAUZE/BANDAGES/DRESSINGS) ×1
BENZOIN TINCTURE PRP APPL 2/3 (GAUZE/BANDAGES/DRESSINGS) ×2 IMPLANT
CANISTER SUCTION 2500CC (MISCELLANEOUS) ×2 IMPLANT
CATH ROBINSON RED A/P 18FR (CATHETERS) ×2 IMPLANT
CLIP LIGATING EXTRA MED SLVR (CLIP) ×2 IMPLANT
CLIP LIGATING EXTRA SM BLUE (MISCELLANEOUS) ×2 IMPLANT
CLOTH BEACON ORANGE TIMEOUT ST (SAFETY) ×2 IMPLANT
COVER SURGICAL LIGHT HANDLE (MISCELLANEOUS) ×4 IMPLANT
CRADLE DONUT ADULT HEAD (MISCELLANEOUS) ×2 IMPLANT
DECANTER SPIKE VIAL GLASS SM (MISCELLANEOUS) IMPLANT
DRAIN HEMOVAC 1/8 X 5 (WOUND CARE) IMPLANT
DRAPE WARM FLUID 44X44 (DRAPE) ×2 IMPLANT
DRSG COVADERM 4X6 (GAUZE/BANDAGES/DRESSINGS) ×1 IMPLANT
ELECT REM PT RETURN 9FT ADLT (ELECTROSURGICAL) ×2
ELECTRODE REM PT RTRN 9FT ADLT (ELECTROSURGICAL) ×1 IMPLANT
EVACUATOR SILICONE 100CC (DRAIN) IMPLANT
GEL ULTRASOUND 20GR AQUASONIC (MISCELLANEOUS) IMPLANT
GLOVE BIO SURGEON STRL SZ7 (GLOVE) ×1 IMPLANT
GLOVE BIOGEL PI IND STRL 7.5 (GLOVE) IMPLANT
GLOVE BIOGEL PI INDICATOR 7.5 (GLOVE) ×3
GLOVE SS BIOGEL STRL SZ 7.5 (GLOVE) ×1 IMPLANT
GLOVE SS N UNI LF 7.5 STRL (GLOVE) ×2 IMPLANT
GLOVE SUPERSENSE BIOGEL SZ 7.5 (GLOVE) ×1
GOWN STRL NON-REIN LRG LVL3 (GOWN DISPOSABLE) ×7 IMPLANT
KIT BASIN OR (CUSTOM PROCEDURE TRAY) ×2 IMPLANT
KIT ROOM TURNOVER OR (KITS) ×2 IMPLANT
NEEDLE 22X1 1/2 (OR ONLY) (NEEDLE) IMPLANT
NS IRRIG 1000ML POUR BTL (IV SOLUTION) ×4 IMPLANT
PACK CAROTID (CUSTOM PROCEDURE TRAY) ×2 IMPLANT
PAD ARMBOARD 7.5X6 YLW CONV (MISCELLANEOUS) ×4 IMPLANT
PATCH HEMASHIELD 8X75 (Vascular Products) ×1 IMPLANT
SHUNT CAROTID BYPASS 10 (VASCULAR PRODUCTS) IMPLANT
SHUNT CAROTID BYPASS 12 (VASCULAR PRODUCTS) ×1 IMPLANT
SHUNT CAROTID BYPASS 12FRX15.5 (VASCULAR PRODUCTS) IMPLANT
SPECIMEN JAR SMALL (MISCELLANEOUS) ×2 IMPLANT
STRIP CLOSURE SKIN 1/2X4 (GAUZE/BANDAGES/DRESSINGS) ×2 IMPLANT
SUT ETHILON 3 0 PS 1 (SUTURE) IMPLANT
SUT PROLENE 6 0 CC (SUTURE) ×2 IMPLANT
SUT VIC AB 3-0 SH 27 (SUTURE) ×4
SUT VIC AB 3-0 SH 27X BRD (SUTURE) ×2 IMPLANT
SUT VICRYL 4-0 PS2 18IN ABS (SUTURE) ×2 IMPLANT
SYR CONTROL 10ML LL (SYRINGE) IMPLANT
TOWEL OR 17X24 6PK STRL BLUE (TOWEL DISPOSABLE) ×2 IMPLANT
TOWEL OR 17X26 10 PK STRL BLUE (TOWEL DISPOSABLE) ×2 IMPLANT
TRAY FOLEY CATH 14FRSI W/METER (CATHETERS) ×2 IMPLANT
WATER STERILE IRR 1000ML POUR (IV SOLUTION) ×2 IMPLANT

## 2011-11-03 NOTE — Anesthesia Preprocedure Evaluation (Addendum)
Anesthesia Evaluation  Patient identified by MRN, date of birth, ID band Patient awake    Reviewed: Allergy & Precautions, H&P , NPO status , Patient's Chart, lab work & pertinent test results, reviewed documented beta blocker date and time   Airway Mallampati: II TM Distance: >3 FB Neck ROM: Full    Dental  (+) Teeth Intact   Pulmonary  clear to auscultation        Cardiovascular Regular Normal    Neuro/Psych    GI/Hepatic GERD-  Medicated and Controlled,  Endo/Other    Renal/GU      Musculoskeletal   Abdominal   Peds  Hematology   Anesthesia Other Findings   Reproductive/Obstetrics                          Anesthesia Physical Anesthesia Plan  ASA: III  Anesthesia Plan: General   Post-op Pain Management:    Induction: Intravenous  Airway Management Planned: Oral ETT  Additional Equipment: Arterial line  Intra-op Plan:   Post-operative Plan: Extubation in OR  Informed Consent: I have reviewed the patients History and Physical, chart, labs and discussed the procedure including the risks, benefits and alternatives for the proposed anesthesia with the patient or authorized representative who has indicated his/her understanding and acceptance.     Plan Discussed with: CRNA  Anesthesia Plan Comments:         Anesthesia Quick Evaluation

## 2011-11-03 NOTE — Op Note (Signed)
[facility] [place]  Patient name: Jeffery Smith MRN: [id number] DOB: 06/10/1925 Sex: male  11/03/2011 Pre-operative Diagnosis: Asymptomatic right carotid stenosis Post-operative diagnosis:  Same Surgeon:  Larina Earthly, M.D. Assistants:  Thomasena Edis Procedure:    right carotid Endarterectomy with Dacron patch angioplasty Anesthesia:  General Blood Loss:  See anesthesia record Specimens:  Carotid Plaque to pathology  Indications for surgery:  Asymptomatic carotid stenosis  Procedure in detail:  The patient was taken to the operating and placed in the supine position. The neck was prepped and draped in the usual sterile fashion. An incision was made anterior to the sternocleidomastoid muscle and continued with electrocautery through the platysma muscle. The muscle was retracted posteriorly and the carotid sheath was opened. The facial vein was ligated with 2-0 silk ties and divided. The common carotid artery was encircled with an umbilical tape and Rummel tourniquet. Dissection was continued onto the carotid bifurcation. The superior thyroid artery was controlled with a 2-0 silk Potts tie. The external carotid organ was encircled with a vessel loop and the internal carotid was encircled with umbilical tape and Rummel tourniquet. The hypoglossal and vagus nerves were identified and preserved.  The patient was given systemic heparinization. After adequate circulation time, the internal,external and common carotid arteries were occluded. The common carotid was opened with an 11 blade and the arteriotomy was continued with Potts scissors onto the internal carotid artery. A 10 shunt was passed up the internal carotid artery, allowed to back bleed, and then passed down the common carotid artery. The shunt was secured with Rummel tourniquet. The endarterectomy was begun on the common carotid artery  plaque was divided proximally with Potts scissors. The endarterectomy was continued  onto the carotid bifurcation. The external carotid was endarterectomized by eversion technique and the internal carotid artery was endarterectomized in an open fashion. Remaining debris was removed from the endarterectomy plane. A Dacron patch was brought to the field and sewn as a patch angioplasty. Prior to completing the anastomosis, the shunt was removed and the usual flushing maneuvers were undertaken. The anastomosis was then completed and flow was restored first to the external and then the internal carotid artery. Excellent flow characteristics were noted with hand-held Doppler in the internal and external carotid arteries.  The patient was given protamine to reverse the heparin. Hemostasis was obtained with electrocautery. The wounds were irrigated with saline. The wound was closed by first reapproximating the sternocleidomastoid muscle over the carotid artery with interrupted 3-0 Vicryl sutures. Next, the platysma was closed with a running 3-0 Vicryl suture. The skin was closed with a 4-0 subcuticular Vicryl suture. Benzoin and Steri-Strips were applied to the incision. A sterile dressing was placed over the incision. All sponge and needle counts were correct. The patient was awakened in the operating room, neurologically intact. They were transferred to the PACU in stable condition.   Disposition:  To PACU in stable condition,neurologically intact    Larina Earthly, M.D. [facility] [place] Office: [phone] Pager:  [phone]

## 2011-11-03 NOTE — Preoperative (Signed)
Beta Blockers   Reason not to administer Beta Blockers:Not Applicable 

## 2011-11-03 NOTE — H&P (Signed)
[name]  Description:  78 year old male  08/08/2011 11:00 AM Office Visit Provider:  Larina Earthly, MD  MRN: [id number] Department:  [facility]            Diagnoses  Reason for Visit    Occlusion and stenosis of carotid artery without mention of cerebral infarction - Primary  New Evaluation   433.10  Carotid           Vitals - Last Recorded       BP  Pulse  Resp  Ht  Wt  BMI    150/69  45  16  6\' 1"  (1.854 m)  180 lb (81.647 kg)  23.75 kg/m2             Progress Notes     [name], MD 08/08/2011 5:46 PM Signed  The patient presents today for evaluation of severe asymptomatic right carotid stenosis. This has been followed at the [facility] hospital for several years with progression. I have his studies from the a hospital and this does suggest greater than 80% right internal carotid artery stenosis. The patient has had no symptoms referable to this. Specifically he has had no amaurosis fugax transient ischemic attack or stroke. He does have a history of knee replacement and is contemplating a right knee replacement. He is not have any major medical difficulties specifically no cardiac disease. He does not smoke and has 1 glass of 1 daily.     Past Medical History     Diagnosis  Date     .  Hypertension      .  Arthritis      .  Carotid artery occlusion      .  Sore throat      .  Hyperlipidemia      .  Benign head tremor      .  Enlarged prostate          History     Substance Use Topics     .  Smoking status:  Never Smoker     .  Smokeless tobacco:  Not on file     .  Alcohol Use:  Yes        1 glass of wine daily      History reviewed. No pertinent family history.  No Known Allergies  Current outpatient prescriptions:aspirin EC 81 MG tablet, Take 325 mg by mouth daily. , Disp: , Rfl: ; finasteride (PROSCAR) 5 MG tablet, Take 5 mg by mouth daily. , Disp: , Rfl: ; GLUCOSAMINE HCL PO, Take by mouth. , Disp: , Rfl: ; ibuprofen (ADVIL,MOTRIN)  400 MG tablet, Take 400 mg by mouth 3 (three) times daily. , Disp: , Rfl: ; loratadine (CLARITIN) 10 MG tablet, Take 10 mg by mouth daily. , Disp: , Rfl:  omeprazole (PRILOSEC) 20 MG capsule, Take 20 mg by mouth daily. , Disp: , Rfl: ; Simvastatin (ZOCOR PO), Take by mouth daily. , Disp: , Rfl: ; Tamsulosin HCl (FLOMAX) 0.4 MG CAPS, Take by mouth daily. , Disp: , Rfl: ; Saw Palmetto, Serenoa repens, (SAW PALMETTO PO), Take by mouth. , Disp: , Rfl:  BP 150/69  Pulse 45  Resp 16  Ht 6\' 1"  (1.854 m)  Wt 180 lb (81.647 kg)  BMI 23.75 kg/m2  Body mass index is 23.75 kg/(m^2).  Review of systems: Cardiac arrhythmia, occasional dizziness from neurologic standpoint, urinary frequency due to prostate disease, arthritis otherwise review of  systems negative  Physical exam: Well-developed well-nourished white male appearing stated age in no acute distress. HEENT normal. Carotid arteries without bruits bilaterally. Heart regular rate and rhythm. Chest clear bilaterally. 2+ radial 2+ femoral and 2+ dorsalis pedis pulses bilaterally. Abdomen soft nontender no masses noted. Neurologically grossly intact. Psychiatric normal affect.  Impression and plan: Severe right internal carotid artery stenosis, asymptomatic. Discuss this at length with the patient and his wife present. I have recommended repeat carotid duplex since his most recent study was in June at the [facility]. I did explain that if indeed he does have a severe stenosis by our duplex I would recommend endarterectomy for reduction of stroke risk. I explained the procedure and 1-2% risk of stroke with surgery. We were unable to and or vascular lab schedule today so we will see him back in several weeks at his convenience for repeat carotid duplex and further discussion.      Addendum:  The patient has been re-examined and re-evaluated.  The patient's history and physical has been reviewed and is unchanged.    Jeffery Smith is a 78 y.o. male is  being admitted with Right ICA stenosis. All the risks, benefits and other treatment options have been discussed with the patient. The patient has consented to proceed with Procedure(s): ENDARTERECTOMY CAROTID as a surgical intervention.  [name] 11/03/2011 10:44 AM [facility]

## 2011-11-03 NOTE — OR Nursing (Signed)
8:10 am Preoperative neuro check- handgrips strong and equal bilateral,moves all 4 extremities, tongue midline. Pt. Hard of hearing, states, his hearing aides don't help. Goes by Jeffery Smith.

## 2011-11-03 NOTE — Anesthesia Postprocedure Evaluation (Signed)
  Anesthesia Post-op Note  Patient: Jeffery Smith  Procedure(s) Performed:  ENDARTERECTOMY CAROTID - Right Carotid endarterectomy with patch angiopolasty  Patient Location: PACU  Anesthesia Type: General  Level of Consciousness: awake and alert   Airway and Oxygen Therapy: Patient Spontanous Breathing and Patient connected to nasal cannula oxygen  Post-op Pain: none  Post-op Assessment: Post-op Vital signs reviewed, Patient's Cardiovascular Status Stable, Respiratory Function Stable and Patent Airway  Post-op Vital Signs: Reviewed and stable  Complications: No apparent anesthesia complications

## 2011-11-03 NOTE — OR Nursing (Signed)
1048 postop neuro same as preop.

## 2011-11-03 NOTE — Progress Notes (Signed)
Pt cuff BP 100s sys. A line reading low; may be positional. Admin 500 NS bolus per Dr order to keep sys 100 +. Pt asymptomatic; talking on phone, appropriate.

## 2011-11-03 NOTE — Transfer of Care (Signed)
Immediate Anesthesia Transfer of Care Note  Patient: Jeffery Smith  Procedure(s) Performed:  ENDARTERECTOMY CAROTID - Right Carotid endarterectomy with patch angiopolasty  Patient Location: PACU  Anesthesia Type: General  Level of Consciousness: awake, alert , oriented and patient cooperative  Airway & Oxygen Therapy: Patient Spontanous Breathing and Patient connected to face mask oxygen  Post-op Assessment: Report given to PACU RN, Post -op Vital signs reviewed and stable and Patient moving all extremities  Post vital signs: Reviewed and stable  Complications: No apparent anesthesia complications

## 2011-11-04 LAB — BASIC METABOLIC PANEL
BUN: 15 mg/dL (ref 6–23)
Calcium: 8.4 mg/dL (ref 8.4–10.5)
Creatinine, Ser: 1.12 mg/dL (ref 0.50–1.35)
GFR calc non Af Amer: 58 mL/min — ABNORMAL LOW (ref >90)
Glucose, Bld: 134 mg/dL — ABNORMAL HIGH (ref 70–99)

## 2011-11-04 LAB — CBC
HCT: 33.2 % — ABNORMAL LOW (ref 39.0–52.0)
Hemoglobin: 11.6 g/dL — ABNORMAL LOW (ref 13.0–17.0)
MCH: 31.9 pg (ref 26.0–34.0)
MCHC: 34.9 g/dL (ref 30.0–36.0)
MCV: 91.2 fL (ref 78.0–100.0)

## 2011-11-04 MED ORDER — OXYCODONE HCL 5 MG PO TABS: 5.0000 mg | ORAL_TABLET | ORAL | Status: AC | PRN

## 2011-11-04 NOTE — Progress Notes (Signed)
Subjective: Interval History: none.. Comfortable  Objective: Vital signs in last 24 hours: Temp:  [97.8 F (36.6 C)-98.2 F (36.8 C)] 98.2 F (36.8 C) (01/26 0405) Pulse Rate:  [51-106] 56  (01/26 0700) Resp:  [11-23] 14  (01/26 0700) BP: (93-137)/(47-65) 103/53 mmHg (01/26 0700) SpO2:  [93 %-100 %] 93 % (01/26 0700) Arterial Line BP: (86-133)/(24-55) 105/27 mmHg (01/26 0500) FiO2 (%):  [2 %] 2 % (01/26 0005) Weight:  [186 lb (84.369 kg)] 186 lb (84.369 kg) (01/25 1230)  Intake/Output from previous day: 01/25 0701 - 01/26 0700 In: 3591.2 [I.V.:2991.2; IV Piggyback:100] Out: 1460 [Urine:1360; Blood:100] Intake/Output this shift:    General appearance: alert and no distress Neck: no adenopathy, no carotid bruit, no JVD, supple, symmetrical, trachea midline, thyroid not enlarged, symmetric, no tenderness/mass/nodules and Right neck wd without hematoma Neurologic: Grossly normal  Lab Results:  Basename 11/04/11 0500  WBC 7.7  HGB 11.6*  HCT 33.2*  PLT 121*   BMET  Basename 11/04/11 0500  NA 138  K 3.8  CL 108  CO2 24  GLUCOSE 134*  BUN 15  CREATININE 1.12  CALCIUM 8.4    Studies/Results: Dg Chest 2 View  10/25/2011  *RADIOLOGY REPORT*  Clinical Data: Preoperative evaluation.  CHEST - 2 VIEW  Comparison: None.  Findings: There is borderline enlargement of the cardiac silhouette.  Slight left hilar prominence is felt to be vascular. There is flattening of the diaphragm on lateral view with overall mild hyperinflation configuration.  Slight increased basilar markings may reflect fibrosis.  No pulmonary edema, pneumonia, or pleural effusion is seen.  Osteophytes are present in the spine.  IMPRESSION: Borderline cardiac silhouette enlargement.  Mild hyperinflation configuration suggests element of COPD.  No pulmonary edema, pneumonia, or other acute abnormality is evident.  Original Report Authenticated By: Crawford Givens, M.D.   Anti-infectives: Anti-infectives     Start      Dose/Rate Route Frequency Ordered Stop   11/03/11 2200   cefUROXime (ZINACEF) 1.5 g in dextrose 5 % 50 mL IVPB  Status:  Discontinued        1.5 g 100 mL/hr over 30 Minutes Intravenous Every 12 hours 11/03/11 1658 11/03/11 1711   11/03/11 2200   cefUROXime (ZINACEF) 1.5 g in dextrose 5 % 50 mL IVPB        1.5 g 100 mL/hr over 30 Minutes Intravenous Every 12 hours 11/03/11 1658 11/04/11 2159   11/03/11 0600   cefUROXime (ZINACEF) 1.5 g in dextrose 5 % 50 mL IVPB        1.5 g 100 mL/hr over 30 Minutes Intravenous On call to O.R. 11/02/11 1510 11/03/11 0855          Assessment/Plan: s/p Procedure(s): ENDARTERECTOMY CAROTID POD 1 neuro stable. DC home   LOS: 1 day   Ramsha Lonigro F 11/04/2011, 9:07 AM

## 2011-11-04 NOTE — Progress Notes (Signed)
Newton Pigg PA notified of pt's BPs; pt asymptomatic and family reports pt tends to run a low BP. Plan -- OK to discharge. Renette Butters, Viona Gilmore

## 2011-11-04 NOTE — Progress Notes (Signed)
Discharge instructions given to pt and his daughter -- both verbalized understanding of activity level, home meds, follow-up appts, wound care, s/s of stroke and potential complications and when to call MD. Pt vitals stable and is ambulating independently. Renette Butters, Viona Gilmore

## 2011-11-04 NOTE — Discharge Summary (Signed)
[facility] Discharge Summary  CHUE BERKOVICH 1924-12-19 78 y.o. male  [id number]  Admission Date: 11/03/2011  Discharge Date: 11/04/11  Physician: Larina Earthly, MD  Admission Diagnosis: Right ICA stenosis   HPI:   This is a 78 y.o. male who presents today for evaluation of severe asymptomatic right carotid stenosis. This has been followed at the [facility] hospital for several years with progression. I have his studies from the a hospital and this does suggest greater than 80% right internal carotid artery stenosis. The patient has had no symptoms referable to this. Specifically he has had no amaurosis fugax transient ischemic attack or stroke. He does have a history of knee replacement and is contemplating a right knee replacement. He is not have any major medical difficulties specifically no cardiac disease. He does not smoke and has 1 glass of 1 daily.     Hospital Course:  The patient was admitted to the hospital and taken to the operating room on 11/03/2011 and underwent a right CEA.  The pt tolerated the procedure well and was transported to the PACU in good condition. By POD 1, his neuro exam was in tact and he was doing well.  He did require some dopamine for BP support, but this was weaned successfully.  The remainder of the hospital course consisted of increasing ambulation and increasing intake of solids without difficulty.    Basename 11/04/11 0500  NA 138  K 3.8  CL 108  CO2 24  GLUCOSE 134*  BUN 15  CALCIUM 8.4    Basename 11/04/11 0500  WBC 7.7  HGB 11.6*  HCT 33.2*  PLT 121*   No results found for this basename: INR:2 in the last 72 hours   Discharge Instructions:   The patient is discharged to home with extensive instructions on wound care and progressive ambulation.  They are instructed not to drive or perform any heavy lifting until returning to see the physician in his office.  Discharge Orders    Future Appointments: Provider:  Department: Dept Phone: Center:   11/21/2011 1:00 PM Larina Earthly, MD [facility] [phone] [facility]     Future Orders Please Complete By Expires   Resume previous diet      Driving Restrictions      Comments:   No driving for 2 weeks   Lifting restrictions      Comments:   No lifting for 4 weeks   Call MD for:  temperature >100.5      Call MD for:  redness, tenderness, or signs of infection (pain, swelling, bleeding, redness, odor or green/yellow discharge around incision site)      Call MD for:  severe or increased pain, loss or decreased feeling  in affected limb(s)      Increase activity slowly      Comments:   Walk with assistance use walker or cane as needed   May shower       Comments:   Shower Sunday 11-05-2011.   No dressing needed      Driving Restrictions      Comments:   No driving for 2 weeks   CAROTID Sugery: Call MD for difficulty swallowing or speaking; weakness in arms or legs that is a new symtom; severe headache.  If you have increased swelling in the neck and/or  are having difficulty breathing, CALL 911         Discharge Diagnosis:  Right ICA stenosis  Secondary Diagnosis: There is no problem  list on file for this patient.  Past Medical History  Diagnosis Date  . Arthritis   . Carotid artery occlusion   . Sore throat   . Hyperlipidemia   . Benign head tremor   . Enlarged prostate   . GERD (gastroesophageal reflux disease)        Clair, Alfieri  Home Medication Instructions [id number]   Printed on:11/04/11 0910  Medication Information                    loratadine (CLARITIN) 10 MG tablet Take 10 mg by mouth daily.            finasteride (PROSCAR) 5 MG tablet Take 5 mg by mouth daily.            omeprazole (PRILOSEC) 20 MG capsule Take 20 mg by mouth daily.            Tamsulosin HCl (FLOMAX) 0.4 MG CAPS Take 0.4 mg by mouth daily.            aspirin 325 MG tablet Take 325 mg by mouth daily.           simvastatin (ZOCOR) 80  MG tablet Take 80 mg by mouth at bedtime.           Melatonin 5 MG TABS Take 5 mg by mouth at bedtime.            oxyCODONE (ROXICODONE) 5 MG immediate release tablet Take 1 tablet (5 mg total) by mouth every 6 (six) hours as needed for pain.           oxyCODONE (OXY IR/ROXICODONE) 5 MG immediate release tablet Take 1 tablet (5 mg total) by mouth every 4 (four) hours as needed.             Disposition: home   Patient's condition: is Good  Follow up: 1. Dr. Arbie Cookey in 2 weeks.   Newton Pigg, PA-C [facility] [phone] 11/04/2011  9:10 AM

## 2011-11-06 ENCOUNTER — Encounter (HOSPITAL_COMMUNITY): Payer: Self-pay | Admitting: Vascular Surgery

## 2011-11-20 ENCOUNTER — Encounter: Payer: Self-pay | Admitting: Vascular Surgery

## 2011-11-21 ENCOUNTER — Encounter: Payer: Self-pay | Admitting: Vascular Surgery

## 2011-11-21 ENCOUNTER — Ambulatory Visit (INDEPENDENT_AMBULATORY_CARE_PROVIDER_SITE_OTHER): Payer: Medicare HMO | Admitting: Vascular Surgery

## 2011-11-21 VITALS — BP 116/65 | HR 81 | Resp 16 | Ht 72.0 in | Wt 189.0 lb

## 2011-11-21 DIAGNOSIS — I6529 Occlusion and stenosis of unspecified carotid artery: Secondary | ICD-10-CM

## 2011-11-21 NOTE — Progress Notes (Signed)
The patient presents today for followup of right carotid endarterectomy for severe asymptomatic carotid disease on 11/02/2001. He's had minimal discomfort has not required any pain medication. He is returned to his baseline activities.  Physical exam: Right neck incision well healed. No carotid bruits. Grossly intact neurologically.  Impression and plan: Stable status post right carotid endarterectomy for asymptomatic disease. He wishes to continue his 6 month surveillance carotid duplex at the [facility]. He'll notify should he develop any difficulties.

## 2012-03-12 ENCOUNTER — Encounter (INDEPENDENT_AMBULATORY_CARE_PROVIDER_SITE_OTHER): Payer: Self-pay | Admitting: *Deleted

## 2012-09-10 ENCOUNTER — Other Ambulatory Visit (HOSPITAL_COMMUNITY): Payer: Self-pay | Admitting: Urology

## 2012-09-10 DIAGNOSIS — N400 Enlarged prostate without lower urinary tract symptoms: Secondary | ICD-10-CM

## 2012-09-12 ENCOUNTER — Ambulatory Visit (HOSPITAL_COMMUNITY)
Admission: RE | Admit: 2012-09-12 | Discharge: 2012-09-12 | Disposition: A | Discharge status: Home / Self Care | Payer: Medicare HMO | Source: Ambulatory Visit | Attending: Urology | Admitting: Urology

## 2012-09-12 DIAGNOSIS — N400 Enlarged prostate without lower urinary tract symptoms: Secondary | ICD-10-CM | POA: Insufficient documentation

## 2012-09-12 DIAGNOSIS — N500 Atrophy of testis: Secondary | ICD-10-CM | POA: Insufficient documentation

## 2012-12-06 ENCOUNTER — Other Ambulatory Visit (HOSPITAL_COMMUNITY): Payer: Self-pay | Admitting: Orthopedic Surgery

## 2012-12-06 DIAGNOSIS — M545 Low back pain: Secondary | ICD-10-CM

## 2012-12-10 ENCOUNTER — Ambulatory Visit (HOSPITAL_COMMUNITY)
Admission: RE | Admit: 2012-12-10 | Discharge: 2012-12-10 | Disposition: A | Discharge status: Home / Self Care | Payer: Medicare HMO | Source: Ambulatory Visit | Attending: Orthopedic Surgery | Admitting: Orthopedic Surgery

## 2012-12-10 DIAGNOSIS — M51379 Other intervertebral disc degeneration, lumbosacral region without mention of lumbar back pain or lower extremity pain: Secondary | ICD-10-CM | POA: Insufficient documentation

## 2012-12-10 DIAGNOSIS — M5126 Other intervertebral disc displacement, lumbar region: Secondary | ICD-10-CM | POA: Insufficient documentation

## 2012-12-10 DIAGNOSIS — M5137 Other intervertebral disc degeneration, lumbosacral region: Secondary | ICD-10-CM | POA: Insufficient documentation

## 2012-12-10 DIAGNOSIS — M545 Low back pain, unspecified: Secondary | ICD-10-CM | POA: Insufficient documentation

## 2012-12-10 DIAGNOSIS — M79609 Pain in unspecified limb: Secondary | ICD-10-CM | POA: Insufficient documentation

## 2014-09-01 ENCOUNTER — Ambulatory Visit: Payer: Self-pay | Admitting: Orthopedic Surgery

## 2014-09-01 NOTE — Progress Notes (Signed)
Preoperative surgical orders have been place into the [facility] hospital system for Jeffery PulseFrederick M Mallet on 09/01/2014, 2:09 PM  by Patrica DuelPERKINS, Harold Mattes for surgery on 09-21-2014.  Preop Total Knee orders including Experal, IV Tylenol, and IV Decadron as long as there are no contraindications to the above medications. Avel Peacerew Romey Cohea, PA-C

## 2014-09-10 NOTE — Patient Instructions (Addendum)
Jeffery Smith  09/10/2014   Your procedure is scheduled on:  09/21/14    Come thru the [facility] Entr[facility].    Follow the Signs to Short Stay Center at  0600       am  Call this number if you have problems the morning of surgery: [phone]   Remember:   Do not eat food or drink liquids after midnight.   Take these medicines the morning of surgery with A SIP OF WATER: none    Do not wear jewelry,   Do not wear lotions, powders, or perfumes. deodorant.   Men may shave face and neck.  Do not bring valuables to the hospital.  Contacts, dentures or bridgework may not be worn into surgery.  Leave suitcase in the car. After surgery it may be brought to your room.  For patients admitted to the hospital, checkout time is 11:00 AM the day of  discharge.           Please read over the following fact sheets that you were given: MRSA Information, coughing and deep breathing exercises, leg exercises            [place] - Preparing for Surgery Before surgery, you can play an important role.  Because skin is not sterile, your skin needs to be as free of germs as possible.  You can reduce the number of germs on your skin by washing with CHG (chlorahexidine gluconate) soap before surgery.  CHG is an antiseptic cleaner which kills germs and bonds with the skin to continue killing germs even after washing. Please DO NOT use if you have an allergy to CHG or antibacterial soaps.  If your skin becomes reddened/irritated stop using the CHG and inform your nurse when you arrive at Short Stay. Do not shave (including legs and underarms) for at least 48 hours prior to the first CHG shower.  You may shave your face/neck. Please follow these instructions carefully:  1.  Shower with CHG Soap the night before surgery and the  morning of Surgery.  2.  If you choose to wash your hair, wash your hair first as usual with your  normal  shampoo.  3.  After you shampoo, rinse your hair and body thoroughly to  remove the  shampoo.                           4.  Use CHG as you would any other liquid soap.  You can apply chg directly  to the skin and wash                       Gently with a scrungie or clean washcloth.  5.  Apply the CHG Soap to your body ONLY FROM THE NECK DOWN.   Do not use on face/ open                           Wound or open sores. Avoid contact with eyes, ears mouth and genitals (private parts).                       Wash face,  Genitals (private parts) with your normal soap.             6.  Wash thoroughly, paying special attention to the area where your surgery  will be performed.  7.  Thoroughly  rinse your body with warm water from the neck down.  8.  DO NOT shower/wash with your normal soap after using and rinsing off  the CHG Soap.                9.  Pat yourself dry with a clean towel.            10.  Wear clean pajamas.            11.  Place clean sheets on your bed the night of your first shower and do not  sleep with pets. Day of Surgery : Do not apply any lotions/deodorants the morning of surgery.  Please wear clean clothes to the hospital/surgery center.  FAILURE TO FOLLOW THESE INSTRUCTIONS MAY RESULT IN THE CANCELLATION OF YOUR SURGERY PATIENT SIGNATURE_________________________________  NURSE SIGNATURE__________________________________  ________________________________________________________________________  WHAT IS A BLOOD TRANSFUSION? Blood Transfusion Information  A transfusion is the replacement of blood or some of its parts. Blood is made up of multiple cells which provide different functions.  Red blood cells carry oxygen and are used for blood loss replacement.  White blood cells fight against infection.  Platelets control bleeding.  Plasma helps clot blood.  Other blood products are available for specialized needs, such as hemophilia or other clotting disorders. BEFORE THE TRANSFUSION  Who gives blood for transfusions?   Healthy volunteers who  are fully evaluated to make sure their blood is safe. This is blood bank blood. Transfusion therapy is the safest it has ever been in the practice of medicine. Before blood is taken from a donor, a complete history is taken to make sure that person has no history of diseases nor engages in risky social behavior (examples are intravenous drug use or sexual activity with multiple partners). The donor's travel history is screened to minimize risk of transmitting infections, such as malaria. The donated blood is tested for signs of infectious diseases, such as HIV and hepatitis. The blood is then tested to be sure it is compatible with you in order to minimize the chance of a transfusion reaction. If you or a relative donates blood, this is often done in anticipation of surgery and is not appropriate for emergency situations. It takes many days to process the donated blood. RISKS AND COMPLICATIONS Although transfusion therapy is very safe and saves many lives, the main dangers of transfusion include:  1. Getting an infectious disease. 2. Developing a transfusion reaction. This is an allergic reaction to something in the blood you were given. Every precaution is taken to prevent this. The decision to have a blood transfusion has been considered carefully by your caregiver before blood is given. Blood is not given unless the benefits outweigh the risks. AFTER THE TRANSFUSION  Right after receiving a blood transfusion, you will usually feel much better and more energetic. This is especially true if your red blood cells have gotten low (anemic). The transfusion raises the level of the red blood cells which carry oxygen, and this usually causes an energy increase.  The nurse administering the transfusion will monitor you carefully for complications. HOME CARE INSTRUCTIONS  No special instructions are needed after a transfusion. You may find your energy is better. Speak with your caregiver about any limitations  on activity for underlying diseases you may have. SEEK MEDICAL CARE IF:   Your condition is not improving after your transfusion.  You develop redness or irritation at the intravenous (IV) site. SEEK IMMEDIATE MEDICAL CARE IF:  Any of the  following symptoms occur over the next 12 hours:  Shaking chills.  You have a temperature by mouth above 102 F (38.9 C), not controlled by medicine.  Chest, back, or muscle pain.  People around you feel you are not acting correctly or are confused.  Shortness of breath or difficulty breathing.  Dizziness and fainting.  You get a rash or develop hives.  You have a decrease in urine output.  Your urine turns a dark color or changes to pink, red, or brown. Any of the following symptoms occur over the next 10 days:  You have a temperature by mouth above 102 F (38.9 C), not controlled by medicine.  Shortness of breath.  Weakness after normal activity.  The white part of the eye turns yellow (jaundice).  You have a decrease in the amount of urine or are urinating less often.  Your urine turns a dark color or changes to pink, red, or brown. Document Released: 09/22/2000 Document Revised: 12/18/2011 Document Reviewed: 05/11/2008 ExitCare Patient Information 2014 [place], [place].  _______________________________________________________________________  Incentive Spirometer  An incentive spirometer is a tool that can help keep your lungs clear and active. This tool measures how well you are filling your lungs with each breath. Taking long deep breaths may help reverse or decrease the chance of developing breathing (pulmonary) problems (especially infection) following:  A long period of time when you are unable to move or be active. BEFORE THE PROCEDURE   If the spirometer includes an indicator to show your best effort, your nurse or respiratory therapist will set it to a desired goal.  If possible, sit up straight or lean slightly  forward. Try not to slouch.  Hold the incentive spirometer in an upright position. INSTRUCTIONS FOR USE  3. Sit on the edge of your bed if possible, or sit up as far as you can in bed or on a chair. 4. Hold the incentive spirometer in an upright position. 5. Breathe out normally. 6. Place the mouthpiece in your mouth and seal your lips tightly around it. 7. Breathe in slowly and as deeply as possible, raising the piston or the ball toward the top of the column. 8. Hold your breath for 3-5 seconds or for as long as possible. Allow the piston or ball to fall to the bottom of the column. 9. Remove the mouthpiece from your mouth and breathe out normally. 10. Rest for a few seconds and repeat Steps 1 through 7 at least 10 times every 1-2 hours when you are awake. Take your time and take a few normal breaths between deep breaths. 11. The spirometer may include an indicator to show your best effort. Use the indicator as a goal to work toward during each repetition. 12. After each set of 10 deep breaths, practice coughing to be sure your lungs are clear. If you have an incision (the cut made at the time of surgery), support your incision when coughing by placing a pillow or rolled up towels firmly against it. Once you are able to get out of bed, walk around indoors and cough well. You may stop using the incentive spirometer when instructed by your caregiver.  RISKS AND COMPLICATIONS  Take your time so you do not get dizzy or light-headed.  If you are in pain, you may need to take or ask for pain medication before doing incentive spirometry. It is harder to take a deep breath if you are having pain. AFTER USE  Rest and breathe slowly and easily.  It can be helpful to keep track of a log of your progress. Your caregiver can provide you with a simple table to help with this. If you are using the spirometer at home, follow these instructions: SEEK MEDICAL CARE IF:   You are having difficultly using  the spirometer.  You have trouble using the spirometer as often as instructed.  Your pain medication is not giving enough relief while using the spirometer.  You develop fever of 100.5 F (38.1 C) or higher. SEEK IMMEDIATE MEDICAL CARE IF:   You cough up bloody sputum that had not been present before.  You develop fever of 102 F (38.9 C) or greater.  You develop worsening pain at or near the incision site. MAKE SURE YOU:   Understand these instructions.  Will watch your condition.  Will get help right away if you are not doing well or get worse. Document Released: 02/05/2007 Document Revised: 12/18/2011 Document Reviewed: 04/08/2007 [facility] Patient Information 2014 [place], [place].   ________________________________________________________________________

## 2014-09-14 ENCOUNTER — Encounter (HOSPITAL_COMMUNITY): Payer: Self-pay

## 2014-09-14 ENCOUNTER — Encounter (HOSPITAL_COMMUNITY)
Admission: RE | Admit: 2014-09-14 | Discharge: 2014-09-14 | Disposition: A | Discharge status: Home / Self Care | Payer: Medicare HMO | Source: Ambulatory Visit | Attending: Orthopedic Surgery | Admitting: Orthopedic Surgery

## 2014-09-14 ENCOUNTER — Ambulatory Visit (HOSPITAL_COMMUNITY)
Admission: RE | Admit: 2014-09-14 | Discharge: 2014-09-14 | Disposition: A | Discharge status: Home / Self Care | Payer: Medicare HMO | Source: Ambulatory Visit | Attending: Orthopedic Surgery | Admitting: Orthopedic Surgery

## 2014-09-14 DIAGNOSIS — J449 Chronic obstructive pulmonary disease, unspecified: Secondary | ICD-10-CM | POA: Diagnosis not present

## 2014-09-14 DIAGNOSIS — Z01818 Encounter for other preprocedural examination: Secondary | ICD-10-CM | POA: Diagnosis not present

## 2014-09-14 HISTORY — DX: Cardiac arrhythmia, unspecified: I49.9

## 2014-09-14 HISTORY — DX: Unspecified hearing loss, unspecified ear: H91.90

## 2014-09-14 LAB — COMPREHENSIVE METABOLIC PANEL
ALBUMIN: 3.8 g/dL (ref 3.5–5.2)
ALT: 13 U/L (ref 0–53)
ANION GAP: 11 (ref 5–15)
AST: 18 U/L (ref 0–37)
Alkaline Phosphatase: 63 U/L (ref 39–117)
BUN: 20 mg/dL (ref 6–23)
CALCIUM: 9.7 mg/dL (ref 8.4–10.5)
CO2: 24 mEq/L (ref 19–32)
CREATININE: 1.19 mg/dL (ref 0.50–1.35)
Chloride: 104 mEq/L (ref 96–112)
GFR calc Af Amer: 61 mL/min — ABNORMAL LOW (ref >90)
GFR calc non Af Amer: 53 mL/min — ABNORMAL LOW (ref >90)
Glucose, Bld: 101 mg/dL — ABNORMAL HIGH (ref 70–99)
Potassium: 4.5 mEq/L (ref 3.7–5.3)
Sodium: 139 mEq/L (ref 137–147)
TOTAL PROTEIN: 6.8 g/dL (ref 6.0–8.3)
Total Bilirubin: 0.6 mg/dL (ref 0.3–1.2)

## 2014-09-14 LAB — URINALYSIS, ROUTINE W REFLEX MICROSCOPIC
Bilirubin Urine: NEGATIVE
Glucose, UA: NEGATIVE mg/dL
Hgb urine dipstick: NEGATIVE
Ketones, ur: NEGATIVE mg/dL
LEUKOCYTES UA: NEGATIVE
NITRITE: NEGATIVE
PROTEIN: NEGATIVE mg/dL
Specific Gravity, Urine: 1.019 (ref 1.005–1.030)
UROBILINOGEN UA: 0.2 mg/dL (ref 0.0–1.0)
pH: 5.5 (ref 5.0–8.0)

## 2014-09-14 LAB — SURGICAL PCR SCREEN
MRSA, PCR: NEGATIVE
STAPHYLOCOCCUS AUREUS: NEGATIVE

## 2014-09-14 LAB — PROTIME-INR
INR: 1.02 (ref 0.00–1.49)
PROTHROMBIN TIME: 13.5 s (ref 11.6–15.2)

## 2014-09-14 LAB — CBC
HCT: 38.7 % — ABNORMAL LOW (ref 39.0–52.0)
Hemoglobin: 12.9 g/dL — ABNORMAL LOW (ref 13.0–17.0)
MCH: 31.2 pg (ref 26.0–34.0)
MCHC: 33.3 g/dL (ref 30.0–36.0)
MCV: 93.7 fL (ref 78.0–100.0)
PLATELETS: 180 10*3/uL (ref 150–400)
RBC: 4.13 MIL/uL — AB (ref 4.22–5.81)
RDW: 13.2 % (ref 11.5–15.5)
WBC: 6.7 10*3/uL (ref 4.0–10.5)

## 2014-09-14 LAB — APTT: aPTT: 27 seconds (ref 24–37)

## 2014-09-15 NOTE — Progress Notes (Addendum)
[facility] most recent [facility] and office visit note from Dr Wallace Cullensharles Bethea.  EKG from 2012 and LOV note- 08/25/2014 on chart .

## 2014-09-16 NOTE — Progress Notes (Signed)
Dr Okey Dupreose  ( anesthesia ) aware of confirmed EKG done 09/14/2014  Along with old ekg from 2012.  No new orders given.

## 2014-09-20 ENCOUNTER — Ambulatory Visit: Payer: Self-pay | Admitting: Orthopedic Surgery

## 2014-09-20 NOTE — H&P (Signed)
Jeffery RollsFrederick Smith DOB: 1925-08-17 Widowed / Language: [place] / Race: White Male Date of Admission:  09/21/2014 CC:  Right Knee Pain History of Present Illness  The patient is a 80 year old male who comes in for a preoperative History and Physical. The patient is scheduled for a right total knee arthroplasty to be performed by Dr. Gus RankinFrank V. Aluisio, MD at [facility] on 09-21-2014. The patient is a 80 year old male who presents today for follow up of their knee. The patient is being followed for their right knee pain and osteoarthritis. They are now 1 year(s) (and 6 months) out from a Synvisc series. Symptoms reported today include: pain (especially with stairs), while the patient does not report symptoms of: swelling or stiffness. The patient feels that they are doing poorly and report their pain level to be mild to moderate. The following medication has been used for pain control: Ibuprofen. The patient has reported improvement of their symptoms with: viscosupplementation. he said the knee is getting progressively worse. He is extremely active for his age and still rides a tractor everyday. 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 He feels like the knee is slowing him down otherwise. He is at a stage now where it hurts him all the time and it is limiting what he can and cannot do outside of his work. He is still doing the work but feels it is getting much harder to do so. He has had cortisone and viscosupplements in the past. The cortisone has helped for a very short amount of time. The viscosupplements did not provide a tremendous amount of benefit. They have been treated conservatively in the past for the above stated problem and despite conservative measures, they continue to have progressive pain and severe functional limitations and dysfunction. They have failed  non-operative management including home exercise, medications, and injections. It is felt that they would benefit from undergoing total joint replacement. Risks and benefits of the procedure have been discussed with the patient and they elect to proceed with surgery. There are no active contraindications to surgery such as ongoing infection or rapidly progressive neurological disease.  Problem List/Past Medical  Spinal Stenosis, Lumbar (724.02) Radiculitis, Thoracic or Lumbar (724.4) Osteoarthritis, knee (M17.9) Spondylosis, Lumbosacral (721.3) Primary osteoarthritis of knee, right Sleep Apnea Skin Cancer Melanoma (Back Area) Cardiac Arrhythmia  "Irregular Heart Rate" Prostate Disease BPH Carotid Artery Stenosis Scarlet Fever Age 89 Measles Mumps Impaired Hearing  Allergies  Tetanus Toxoid Adsorbed *TOXOIDS*  Family History Drug / Alcohol Addiction Brother. Cancer Brother, Mother. mother and brother Rheumatoid Arthritis Maternal Grandmother, Mother. mother and sister  Social History  Exercise Exercises daily; does gym / [employer] Current work status retired No history of drug/alcohol rehab Marital status widowed Current drinker 03/03/2014: Currently drinks wine 5-7 times per week Children 3 Not under pain contract Tobacco / smoke exposure 03/03/2014: no, no Number of flights of stairs before winded 2-3 Tobacco use Never smoker. 03/03/2014, never smoker Alcohol use current drinker; drinks wine; less than 5 per week Illicit drug use no Drug/Alcohol Rehab (Currently) no Drug/Alcohol Rehab (Previously) no Pain Contract no Living situation live alone Advance Directives Living Will  Medication History Finasteride (5MG  Tablet, Oral) Active. Omeprazole (20MG  Capsule DR, Oral) Active. Aspirin EC (81MG  Tablet DR, Oral) Active. Ibuprofen (200MG  Tablet, Oral) Active. DiphenhydrAMINE HCl (25MG  Tablet, Oral) Active. Simvastatin (  80MG  Tablet,  Oral) Active.  Past Surgical History Carotid Artery Surgery right Colon Polyp Removal - Colonoscopy Cataract Surgery bilateral Total Knee Replacement left Tonsillectomy  Review of Systems  General Not Present- Chills, Fatigue, Fever, Memory Loss, Night Sweats, Weight Gain and Weight Loss. Skin Not Present- Eczema, Hives, Itching, Lesions and Rash. HEENT Present- Hearing problems. Not Present- Dentures, Double Vision, Headache, Hearing Loss, Tinnitus and Visual Loss. Respiratory Not Present- Allergies, Chronic Cough, Coughing up blood, Shortness of breath at rest and Shortness of breath with exertion. Cardiovascular Not Present- Chest Pain, Difficulty Breathing Lying Down, Murmur, Palpitations, Racing/skipping heartbeats and Swelling. Gastrointestinal Not Present- Abdominal Pain, Bloody Stool, Constipation, Diarrhea, Difficulty Swallowing, Heartburn, Jaundice, Loss of appetitie, Nausea and Vomiting. Male Genitourinary Not Present- Blood in Urine, Discharge, Flank Pain, Incontinence, Painful Urination, Urgency, Urinary frequency, Urinary Retention, Urinating at Night and Weak urinary stream. Musculoskeletal Present- Joint Pain. Not Present- Back Pain, Joint Swelling, Morning Stiffness, Muscle Pain, Muscle Weakness and Spasms. Neurological Not Present- Blackout spells, Difficulty with balance, Dizziness, Paralysis, Tremor and Weakness. Psychiatric Not Present- Insomnia.  Vitals  Weight: 180 lb Height: 71in Weight was reported by patient. Height was reported by patient. Body Surface Area: 2.02 m Body Mass Index: 25.1 kg/m  BP: 158/82 (Sitting, Left Arm, Standard)   Physical Exam General Mental Status -Alert, cooperative and good historian. General Appearance-pleasant, Not in acute distress. Orientation-Oriented X3. Build & Nutrition-Well nourished and Well developed.  Head and Neck Head-normocephalic, atraumatic . Neck Global Assessment - supple, no bruit  auscultated on the right, no bruit auscultated on the left.  Eye Vision-Wears corrective lenses. Pupil - Bilateral-Regular and Round. Motion - Bilateral-EOMI.  Chest and Lung Exam Auscultation Breath sounds - clear at anterior chest wall and clear at posterior chest wall. Adventitious sounds - No Adventitious sounds.  Cardiovascular Auscultation Rhythm - Regular rate and rhythm. Heart Sounds - S1 WNL and S2 WNL. Murmurs & Other Heart Sounds: Murmur 1 - Location - Aortic Area. Timing - Early systolic. Grade - II/VI. Character - Low pitched.  Abdomen Palpation/Percussion Tenderness - Abdomen is non-tender to palpation. Rigidity (guarding) - Abdomen is soft. Auscultation Auscultation of the abdomen reveals - Bowel sounds normal.  Male Genitourinary Note: Not done, not pertinent to present illness   Musculoskeletal Note: On exam, he is alert and oriented in no apparent distress. His right hip shows normal range of motion with no discomfort. His right knee shows no effusion. He has a varus deformity. His range is about 5-120 with marked crepitus on range of motion. Tenderness medial greater than lateral and no instability noted. Pulses are trace palpable. Sensation and motor are intact.  RADIOGRAPHS: AP both knees and lateral show the prosthesis on the left is in good position with no abnormalities. On the right he has severe end stage bone on bone arthritis with some grooving of the tibia and femur in the medial compartment and large osteophyte formation. He also has severe osteophytes at the patellofemoral joint.   Assessment & Plan  Primary osteoarthritis of knee, right  Note:Plan is for a Right Total Knee Replacement by Dr. Lequita HaltAluisio.  Plan is to go to rehab versus home with 24 hour home care.  [facility] - [facility] - Patient has been seen preoperatively and felt to be stable for surgery. [address] [place], [place] [id number] [phone]  Topical TXA  - Carotid Disease  Signed electronically by Lauraine RinneAlexzandrew L Dempsey Ahonen, III PA-C

## 2014-09-21 ENCOUNTER — Encounter (HOSPITAL_COMMUNITY)
Admission: RE | Disposition: A | Discharge status: Skilled Nursing Facility | Payer: Self-pay | Source: Ambulatory Visit | Attending: Orthopedic Surgery

## 2014-09-21 ENCOUNTER — Inpatient Hospital Stay (HOSPITAL_COMMUNITY): Payer: Medicare HMO | Admitting: Anesthesiology

## 2014-09-21 ENCOUNTER — Inpatient Hospital Stay (HOSPITAL_COMMUNITY)
Admission: RE | Admit: 2014-09-21 | Discharge: 2014-09-23 | DRG: 470 | Disposition: A | Discharge status: Skilled Nursing Facility | Payer: Medicare HMO | Source: Ambulatory Visit | Attending: Orthopedic Surgery | Admitting: Orthopedic Surgery

## 2014-09-21 ENCOUNTER — Encounter (HOSPITAL_COMMUNITY): Payer: Self-pay | Admitting: *Deleted

## 2014-09-21 DIAGNOSIS — I6529 Occlusion and stenosis of unspecified carotid artery: Secondary | ICD-10-CM | POA: Diagnosis present

## 2014-09-21 DIAGNOSIS — M1711 Unilateral primary osteoarthritis, right knee: Secondary | ICD-10-CM

## 2014-09-21 DIAGNOSIS — M179 Osteoarthritis of knee, unspecified: Secondary | ICD-10-CM | POA: Diagnosis present

## 2014-09-21 DIAGNOSIS — E785 Hyperlipidemia, unspecified: Secondary | ICD-10-CM | POA: Diagnosis present

## 2014-09-21 DIAGNOSIS — M25561 Pain in right knee: Secondary | ICD-10-CM | POA: Diagnosis present

## 2014-09-21 DIAGNOSIS — N400 Enlarged prostate without lower urinary tract symptoms: Secondary | ICD-10-CM | POA: Diagnosis present

## 2014-09-21 DIAGNOSIS — K219 Gastro-esophageal reflux disease without esophagitis: Secondary | ICD-10-CM | POA: Diagnosis present

## 2014-09-21 DIAGNOSIS — M1710 Unilateral primary osteoarthritis, unspecified knee: Secondary | ICD-10-CM | POA: Diagnosis present

## 2014-09-21 HISTORY — PX: TOTAL KNEE ARTHROPLASTY: SHX125

## 2014-09-21 LAB — TYPE AND SCREEN
ABO/RH(D): A POS
ANTIBODY SCREEN: NEGATIVE

## 2014-09-21 LAB — ABO/RH: ABO/RH(D): A POS

## 2014-09-21 SURGERY — ARTHROPLASTY, KNEE, TOTAL
Anesthesia: Spinal | Site: Knee | Laterality: Right

## 2014-09-21 MED ORDER — PROPOFOL INFUSION 10 MG/ML OPTIME
INTRAVENOUS | Status: DC | PRN
  Administered 2014-09-21: 25 ug/kg/min via INTRAVENOUS

## 2014-09-21 MED ORDER — SODIUM CHLORIDE 0.9 % IV SOLN
INTRAVENOUS | Status: DC
  Administered 2014-09-21 – 2014-09-22 (×2): via INTRAVENOUS

## 2014-09-21 MED ORDER — PHENOL 1.4 % MT LIQD
1.0000 | OROMUCOSAL | Status: DC | PRN
  Filled 2014-09-21: qty 177

## 2014-09-21 MED ORDER — ACETAMINOPHEN 650 MG RE SUPP: 650.0000 mg | Freq: Four times a day (QID) | RECTAL | Status: DC | PRN

## 2014-09-21 MED ORDER — LACTATED RINGERS IV SOLN
INTRAVENOUS | Status: DC | PRN
  Administered 2014-09-21 (×2): via INTRAVENOUS

## 2014-09-21 MED ORDER — SODIUM CHLORIDE 0.9 % IJ SOLN
INTRAMUSCULAR | Status: AC
  Filled 2014-09-21: qty 50

## 2014-09-21 MED ORDER — PROPOFOL 10 MG/ML IV BOLUS
INTRAVENOUS | Status: AC
  Filled 2014-09-21: qty 20

## 2014-09-21 MED ORDER — ONDANSETRON HCL 4 MG/2ML IJ SOLN
INTRAMUSCULAR | Status: AC
  Filled 2014-09-21: qty 2

## 2014-09-21 MED ORDER — SODIUM CHLORIDE 0.9 % IR SOLN
Status: DC | PRN
  Administered 2014-09-21: 1000 mL

## 2014-09-21 MED ORDER — METHOCARBAMOL 500 MG PO TABS: 500.0000 mg | ORAL_TABLET | Freq: Four times a day (QID) | ORAL | Status: DC | PRN

## 2014-09-21 MED ORDER — PROPOFOL 10 MG/ML IV BOLUS
INTRAVENOUS | Status: DC | PRN
  Administered 2014-09-21: 50 mg via INTRAVENOUS

## 2014-09-21 MED ORDER — RIVAROXABAN 10 MG PO TABS
10.0000 mg | ORAL_TABLET | Freq: Every day | ORAL | Status: DC
  Administered 2014-09-22 – 2014-09-23 (×2): 10 mg via ORAL
  Filled 2014-09-21 (×3): qty 1

## 2014-09-21 MED ORDER — ACETAMINOPHEN 325 MG PO TABS
650.0000 mg | ORAL_TABLET | Freq: Four times a day (QID) | ORAL | Status: DC | PRN
  Administered 2014-09-22: 650 mg via ORAL
  Filled 2014-09-21: qty 2

## 2014-09-21 MED ORDER — METOCLOPRAMIDE HCL 10 MG PO TABS: 5.0000 mg | ORAL_TABLET | Freq: Three times a day (TID) | ORAL | Status: DC | PRN

## 2014-09-21 MED ORDER — METOCLOPRAMIDE HCL 5 MG/ML IJ SOLN: 5.0000 mg | Freq: Three times a day (TID) | INTRAMUSCULAR | Status: DC | PRN

## 2014-09-21 MED ORDER — FENTANYL CITRATE 0.05 MG/ML IJ SOLN
INTRAMUSCULAR | Status: DC | PRN
  Administered 2014-09-21: 25 ug via INTRAVENOUS

## 2014-09-21 MED ORDER — CEFAZOLIN SODIUM-DEXTROSE 2-3 GM-% IV SOLR
2.0000 g | INTRAVENOUS | Status: AC
  Administered 2014-09-21: 2 g via INTRAVENOUS

## 2014-09-21 MED ORDER — MENTHOL 3 MG MT LOZG
1.0000 | LOZENGE | OROMUCOSAL | Status: DC | PRN
  Filled 2014-09-21: qty 9

## 2014-09-21 MED ORDER — BUPIVACAINE HCL (PF) 0.25 % IJ SOLN
INTRAMUSCULAR | Status: AC
  Filled 2014-09-21: qty 30

## 2014-09-21 MED ORDER — ONDANSETRON HCL 4 MG/2ML IJ SOLN: 4.0000 mg | Freq: Four times a day (QID) | INTRAMUSCULAR | Status: DC | PRN

## 2014-09-21 MED ORDER — DIPHENHYDRAMINE HCL 12.5 MG/5ML PO ELIX: 12.5000 mg | ORAL_SOLUTION | ORAL | Status: DC | PRN

## 2014-09-21 MED ORDER — ACETAMINOPHEN 10 MG/ML IV SOLN
1000.0000 mg | Freq: Once | INTRAVENOUS | Status: AC
  Administered 2014-09-21: 1000 mg via INTRAVENOUS
  Filled 2014-09-21: qty 100

## 2014-09-21 MED ORDER — CHLORHEXIDINE GLUCONATE 4 % EX LIQD: 60.0000 mL | Freq: Once | CUTANEOUS | Status: DC

## 2014-09-21 MED ORDER — FLEET ENEMA 7-19 GM/118ML RE ENEM: 1.0000 | ENEMA | Freq: Once | RECTAL | Status: AC | PRN

## 2014-09-21 MED ORDER — TRAMADOL HCL 50 MG PO TABS
50.0000 mg | ORAL_TABLET | Freq: Four times a day (QID) | ORAL | Status: DC | PRN
  Administered 2014-09-23: 50 mg via ORAL
  Filled 2014-09-21: qty 1

## 2014-09-21 MED ORDER — BISACODYL 10 MG RE SUPP: 10.0000 mg | Freq: Every day | RECTAL | Status: DC | PRN

## 2014-09-21 MED ORDER — CEFAZOLIN SODIUM-DEXTROSE 2-3 GM-% IV SOLR
2.0000 g | Freq: Four times a day (QID) | INTRAVENOUS | Status: AC
  Administered 2014-09-21 (×2): 2 g via INTRAVENOUS
  Filled 2014-09-21 (×2): qty 50

## 2014-09-21 MED ORDER — FENTANYL CITRATE 0.05 MG/ML IJ SOLN
INTRAMUSCULAR | Status: AC
Start: 2014-09-21 — End: 2014-09-21
  Filled 2014-09-21: qty 2

## 2014-09-21 MED ORDER — CEFAZOLIN SODIUM-DEXTROSE 2-3 GM-% IV SOLR
INTRAVENOUS | Status: AC
  Filled 2014-09-21: qty 50

## 2014-09-21 MED ORDER — DEXAMETHASONE SODIUM PHOSPHATE 10 MG/ML IJ SOLN
10.0000 mg | Freq: Once | INTRAMUSCULAR | Status: AC
  Administered 2014-09-22: 10 mg via INTRAVENOUS
  Filled 2014-09-21: qty 1

## 2014-09-21 MED ORDER — ONDANSETRON HCL 4 MG PO TABS: 4.0000 mg | ORAL_TABLET | Freq: Four times a day (QID) | ORAL | Status: DC | PRN

## 2014-09-21 MED ORDER — ONDANSETRON HCL 4 MG/2ML IJ SOLN
INTRAMUSCULAR | Status: DC | PRN
  Administered 2014-09-21: 4 mg via INTRAVENOUS

## 2014-09-21 MED ORDER — OXYCODONE HCL 5 MG PO TABS
5.0000 mg | ORAL_TABLET | ORAL | Status: DC | PRN
  Administered 2014-09-21 – 2014-09-23 (×12): 10 mg via ORAL
  Filled 2014-09-21 (×12): qty 2

## 2014-09-21 MED ORDER — MORPHINE SULFATE 2 MG/ML IJ SOLN
1.0000 mg | INTRAMUSCULAR | Status: DC | PRN
  Administered 2014-09-21 (×2): 1 mg via INTRAVENOUS
  Filled 2014-09-21 (×2): qty 1

## 2014-09-21 MED ORDER — BUPIVACAINE LIPOSOME 1.3 % IJ SUSP
20.0000 mL | Freq: Once | INTRAMUSCULAR | Status: DC
  Filled 2014-09-21: qty 20

## 2014-09-21 MED ORDER — MEPERIDINE HCL 50 MG/ML IJ SOLN: 6.2500 mg | INTRAMUSCULAR | Status: DC | PRN

## 2014-09-21 MED ORDER — TRANEXAMIC ACID 100 MG/ML IV SOLN
2000.0000 mg | INTRAVENOUS | Status: DC | PRN
  Administered 2014-09-21: 2000 mg via TOPICAL

## 2014-09-21 MED ORDER — KETOROLAC TROMETHAMINE 15 MG/ML IJ SOLN
7.5000 mg | Freq: Four times a day (QID) | INTRAMUSCULAR | Status: AC | PRN
  Administered 2014-09-21: 7.5 mg via INTRAVENOUS
  Filled 2014-09-21: qty 1

## 2014-09-21 MED ORDER — ACETAMINOPHEN 500 MG PO TABS
1000.0000 mg | ORAL_TABLET | Freq: Four times a day (QID) | ORAL | Status: AC
  Administered 2014-09-21 – 2014-09-22 (×4): 1000 mg via ORAL
  Filled 2014-09-21 (×5): qty 2

## 2014-09-21 MED ORDER — FINASTERIDE 5 MG PO TABS
5.0000 mg | ORAL_TABLET | Freq: Every day | ORAL | Status: DC
  Administered 2014-09-21 – 2014-09-22 (×2): 5 mg via ORAL
  Filled 2014-09-21 (×3): qty 1

## 2014-09-21 MED ORDER — METHOCARBAMOL 1000 MG/10ML IJ SOLN
500.0000 mg | Freq: Four times a day (QID) | INTRAMUSCULAR | Status: DC | PRN
  Administered 2014-09-21: 500 mg via INTRAVENOUS
  Filled 2014-09-21 (×2): qty 5

## 2014-09-21 MED ORDER — BUPIVACAINE IN DEXTROSE 0.75-8.25 % IT SOLN
INTRATHECAL | Status: DC | PRN
  Administered 2014-09-21: 1.5 mL via INTRATHECAL

## 2014-09-21 MED ORDER — DEXAMETHASONE SODIUM PHOSPHATE 10 MG/ML IJ SOLN
INTRAMUSCULAR | Status: AC
  Filled 2014-09-21: qty 1

## 2014-09-21 MED ORDER — 0.9 % SODIUM CHLORIDE (POUR BTL) OPTIME
TOPICAL | Status: DC | PRN
  Administered 2014-09-21: 1000 mL

## 2014-09-21 MED ORDER — BUPIVACAINE LIPOSOME 1.3 % IJ SUSP
INTRAMUSCULAR | Status: DC | PRN
  Administered 2014-09-21: 20 mL

## 2014-09-21 MED ORDER — SODIUM CHLORIDE 0.9 % IV SOLN: INTRAVENOUS | Status: DC

## 2014-09-21 MED ORDER — POLYETHYLENE GLYCOL 3350 17 G PO PACK: 17.0000 g | PACK | Freq: Every day | ORAL | Status: DC | PRN

## 2014-09-21 MED ORDER — DOCUSATE SODIUM 100 MG PO CAPS
100.0000 mg | ORAL_CAPSULE | Freq: Two times a day (BID) | ORAL | Status: DC
  Administered 2014-09-21 – 2014-09-23 (×4): 100 mg via ORAL

## 2014-09-21 MED ORDER — SODIUM CHLORIDE 0.9 % IJ SOLN
INTRAMUSCULAR | Status: DC | PRN
  Administered 2014-09-21: 30 mL

## 2014-09-21 MED ORDER — TRANEXAMIC ACID 100 MG/ML IV SOLN
2000.0000 mg | Freq: Once | INTRAVENOUS | Status: DC
  Filled 2014-09-21: qty 20

## 2014-09-21 MED ORDER — FENTANYL CITRATE 0.05 MG/ML IJ SOLN: 25.0000 ug | INTRAMUSCULAR | Status: DC | PRN

## 2014-09-21 MED ORDER — BUPIVACAINE HCL 0.25 % IJ SOLN
INTRAMUSCULAR | Status: DC | PRN
  Administered 2014-09-21: 20 mL

## 2014-09-21 MED ORDER — PANTOPRAZOLE SODIUM 40 MG PO TBEC
40.0000 mg | DELAYED_RELEASE_TABLET | Freq: Every day | ORAL | Status: DC
  Administered 2014-09-21: 40 mg via ORAL
  Filled 2014-09-21 (×2): qty 1

## 2014-09-21 MED ORDER — DEXAMETHASONE SODIUM PHOSPHATE 10 MG/ML IJ SOLN
10.0000 mg | Freq: Once | INTRAMUSCULAR | Status: AC
  Administered 2014-09-21: 10 mg via INTRAVENOUS

## 2014-09-21 MED ORDER — LACTATED RINGERS IV SOLN
INTRAVENOUS | Status: DC
  Administered 2014-09-21: 1000 mL via INTRAVENOUS

## 2014-09-21 MED ORDER — PROMETHAZINE HCL 25 MG/ML IJ SOLN: 6.2500 mg | INTRAMUSCULAR | Status: DC | PRN

## 2014-09-21 SURGICAL SUPPLY — 62 items
BAG SPEC THK2 15X12 ZIP CLS (MISCELLANEOUS) ×1
BAG ZIPLOCK 12X15 (MISCELLANEOUS) ×3 IMPLANT
BANDAGE ELASTIC 6 VELCRO ST LF (GAUZE/BANDAGES/DRESSINGS) ×3 IMPLANT
BANDAGE ESMARK 6X9 LF (GAUZE/BANDAGES/DRESSINGS) ×1 IMPLANT
BLADE SAG 18X100X1.27 (BLADE) ×3 IMPLANT
BLADE SAW SGTL 11.0X1.19X90.0M (BLADE) ×3 IMPLANT
BNDG CMPR 9X6 STRL LF SNTH (GAUZE/BANDAGES/DRESSINGS) ×1
BNDG ESMARK 6X9 LF (GAUZE/BANDAGES/DRESSINGS) ×3
BOWL SMART MIX CTS (DISPOSABLE) ×3 IMPLANT
CAP KNEE TOTAL 3 SIGMA ×2 IMPLANT
CEMENT HV SMART SET (Cement) ×6 IMPLANT
CLOSURE WOUND 1/2 X4 (GAUZE/BANDAGES/DRESSINGS) ×2
CUFF TOURN SGL QUICK 34 (TOURNIQUET CUFF) ×3
CUFF TRNQT CYL 34X4X40X1 (TOURNIQUET CUFF) ×1 IMPLANT
DECANTER SPIKE VIAL GLASS SM (MISCELLANEOUS) ×3 IMPLANT
DRAPE EXTREMITY T 121X128X90 (DRAPE) ×3 IMPLANT
DRAPE POUCH INSTRU U-SHP 10X18 (DRAPES) ×3 IMPLANT
DRAPE U-SHAPE 47X51 STRL (DRAPES) ×3 IMPLANT
DRSG ADAPTIC 3X8 NADH LF (GAUZE/BANDAGES/DRESSINGS) ×3 IMPLANT
DRSG PAD ABDOMINAL 8X10 ST (GAUZE/BANDAGES/DRESSINGS) ×3 IMPLANT
DURAPREP 26ML APPLICATOR (WOUND CARE) ×3 IMPLANT
ELECT REM PT RETURN 9FT ADLT (ELECTROSURGICAL) ×3
ELECTRODE REM PT RTRN 9FT ADLT (ELECTROSURGICAL) ×1 IMPLANT
EVACUATOR 1/8 PVC DRAIN (DRAIN) ×3 IMPLANT
FACESHIELD WRAPAROUND (MASK) ×15 IMPLANT
FACESHIELD WRAPAROUND OR TEAM (MASK) ×5 IMPLANT
GAUZE SPONGE 4X4 12PLY STRL (GAUZE/BANDAGES/DRESSINGS) ×3 IMPLANT
GLOVE BIO SURGEON STRL SZ7.5 (GLOVE) ×2 IMPLANT
GLOVE BIO SURGEON STRL SZ8 (GLOVE) ×5 IMPLANT
GLOVE BIO SURGEON STRL SZ8.5 (GLOVE) ×2 IMPLANT
GLOVE BIOGEL PI IND STRL 6.5 (GLOVE) IMPLANT
GLOVE BIOGEL PI IND STRL 8 (GLOVE) ×1 IMPLANT
GLOVE BIOGEL PI INDICATOR 6.5 (GLOVE) ×4
GLOVE BIOGEL PI INDICATOR 8 (GLOVE) ×4
GOWN STRL REUS W/TWL LRG LVL3 (GOWN DISPOSABLE) ×5 IMPLANT
GOWN STRL REUS W/TWL XL LVL3 (GOWN DISPOSABLE) ×4 IMPLANT
HANDPIECE INTERPULSE COAX TIP (DISPOSABLE) ×3
IMMOBILIZER KNEE 20 (SOFTGOODS) ×3
IMMOBILIZER KNEE 20 THIGH 36 (SOFTGOODS) ×1 IMPLANT
KIT BASIN OR (CUSTOM PROCEDURE TRAY) ×3 IMPLANT
MANIFOLD NEPTUNE II (INSTRUMENTS) ×3 IMPLANT
NDL SAFETY ECLIPSE 18X1.5 (NEEDLE) ×2 IMPLANT
NEEDLE HYPO 18GX1.5 SHARP (NEEDLE) ×6
NS IRRIG 1000ML POUR BTL (IV SOLUTION) ×3 IMPLANT
PACK TOTAL JOINT (CUSTOM PROCEDURE TRAY) ×3 IMPLANT
PADDING CAST COTTON 6X4 STRL (CAST SUPPLIES) ×8 IMPLANT
PIN STEINMAN FIXATION KNEE (PIN) ×2 IMPLANT
POSITIONER SURGICAL ARM (MISCELLANEOUS) ×3 IMPLANT
SET HNDPC FAN SPRY TIP SCT (DISPOSABLE) ×1 IMPLANT
STRIP CLOSURE SKIN 1/2X4 (GAUZE/BANDAGES/DRESSINGS) ×4 IMPLANT
SUCTION FRAZIER 12FR DISP (SUCTIONS) ×3 IMPLANT
SUT MNCRL AB 4-0 PS2 18 (SUTURE) ×3 IMPLANT
SUT VIC AB 2-0 CT1 27 (SUTURE) ×9
SUT VIC AB 2-0 CT1 TAPERPNT 27 (SUTURE) ×3 IMPLANT
SUT VLOC 180 0 24IN GS25 (SUTURE) ×3 IMPLANT
SYR 20CC LL (SYRINGE) ×3 IMPLANT
SYR 50ML LL SCALE MARK (SYRINGE) ×3 IMPLANT
TOWEL OR 17X26 10 PK STRL BLUE (TOWEL DISPOSABLE) ×3 IMPLANT
TOWEL OR NON WOVEN STRL DISP B (DISPOSABLE) IMPLANT
TRAY FOLEY CATH 16FRSI W/METER (SET/KITS/TRAYS/PACK) ×2 IMPLANT
WATER STERILE IRR 1500ML POUR (IV SOLUTION) ×3 IMPLANT
WRAP KNEE MAXI GEL POST OP (GAUZE/BANDAGES/DRESSINGS) ×3 IMPLANT

## 2014-09-21 NOTE — Progress Notes (Signed)
Utilization review completed.  

## 2014-09-21 NOTE — Anesthesia Postprocedure Evaluation (Signed)
  Anesthesia Post-op Note  Patient: Jeffery Smith  Procedure(s) Performed: Procedure(s) (LRB): RIGHT TOTAL KNEE ARTHROPLASTY (Right)  Patient Location: PACU  Anesthesia Type: Spinal  Level of Consciousness: awake and alert   Airway and Oxygen Therapy: Patient Spontanous Breathing  Post-op Pain: mild  Post-op Assessment: Post-op Vital signs reviewed, Patient's Cardiovascular Status Stable, Respiratory Function Stable, Patent Airway and No signs of Nausea or vomiting  Last Vitals:  Filed Vitals:   09/21/14 1115  BP: 138/69  Pulse: 64  Temp: 36.3 C  Resp: 14    Post-op Vital Signs: stable   Complications: No apparent anesthesia complications

## 2014-09-21 NOTE — Transfer of Care (Signed)
Immediate Anesthesia Transfer of Care Note  Patient: Jeffery Smith  Procedure(s) Performed: Procedure(s): RIGHT TOTAL KNEE ARTHROPLASTY (Right)  Patient Location: PACU  Anesthesia Type:Spinal  Level of Consciousness: awake, alert , oriented and patient cooperative  Airway & Oxygen Therapy: Patient Spontanous Breathing and Patient connected to face mask oxygen  Post-op Assessment: Report given to PACU RN and Post -op Vital signs reviewed and stable  Post vital signs: Reviewed and stable  Complications: No apparent anesthesia complications

## 2014-09-21 NOTE — Op Note (Signed)
Pre-operative diagnosis- Osteoarthritis  Right knee(s)  Post-operative diagnosis- Osteoarthritis Right knee(s)  Procedure-  Right  Total Knee Arthroplasty  Surgeon- Gus RankinFrank V. Nakiyah Beverley, MD  Assistant- Avel Peacerew Perkins, PA-C   Anesthesia-  Spinal  EBL-* No blood loss amount entered *   Drains Hemovac  Tourniquet time-  Total Tourniquet Time Documented: Thigh (Right) - 36 minutes Total: Thigh (Right) - 36 minutes     Complications- None  Condition-PACU - hemodynamically stable.   Brief Clinical Note  Juliann PulseFrederick M Difatta is a 80 y.o. year old male with end stage OA of his right knee with progressively worsening pain and dysfunction. He has constant pain, with activity and at rest and significant functional deficits with difficulties even with ADLs. He has had extensive non-op management including analgesics, injections of cortisone and viscosupplements, and home exercise program, but remains in significant pain with significant dysfunction. Radiographs show bone on bone arthritis medial and patellofemoral. He presents now for rightt Total Knee Arthroplasty.    Procedure in detail---   The patient is brought into the operating room and positioned supine on the operating table. After successful administration of  Spinal,   a tourniquet is placed high on the  Right thigh(s) and the lower extremity is prepped and draped in the usual sterile fashion. Time out is performed by the operating team and then the  Right lower extremity is wrapped in Esmarch, knee flexed and the tourniquet inflated to 300 mmHg.       A midline incision is made with a ten blade through the subcutaneous tissue to the level of the extensor mechanism. A fresh blade is used to make a medial parapatellar arthrotomy. Soft tissue over the proximal medial tibia is subperiosteally elevated to the joint line with a knife and into the semimembranosus bursa with a Cobb elevator. Soft tissue over the proximal lateral tibia is elevated  with attention being paid to avoiding the patellar tendon on the tibial tubercle. The patella is everted, knee flexed 90 degrees and the ACL and PCL are removed. Findings are bone  On bone all 3 compartments with massive global osteophytes.      The drill is used to create a starting hole in the distal femur and the canal is thoroughly irrigated with sterile saline to remove the fatty contents. The 5 degree Right  valgus alignment guide is placed into the femoral canal and the distal femoral cutting block is pinned to remove 10 mm off the distal femur. Resection is made with an oscillating saw.      The tibia is subluxed forward and the menisci are removed. The extramedullary alignment guide is placed referencing proximally at the medial aspect of the tibial tubercle and distally along the second metatarsal axis and tibial crest. The block is pinned to remove 2mm off the more deficient medial  side. Resection is made with an oscillating saw. Size 5is the most appropriate size for the tibia and the proximal tibia is prepared with the modular drill and keel punch for that size.      The femoral sizing guide is placed and size 5 is most appropriate. Rotation is marked off the epicondylar axis and confirmed by creating a rectangular flexion gap at 90 degrees. The size 5 cutting block is pinned in this rotation and the anterior, posterior and chamfer cuts are made with the oscillating saw. The intercondylar block is then placed and that cut is made.      Trial size 5 tibial component, trial  size 5 posterior stabilized femur and a 12.5  mm posterior stabilized rotating platform insert trial is placed. Full extension is achieved with excellent varus/valgus and anterior/posterior balance throughout full range of motion. The patella is everted and thickness measured to be 27  mm. Free hand resection is taken to 15 mm, a 41 template is placed, lug holes are drilled, trial patella is placed, and it tracks normally.  Osteophytes are removed off the posterior femur with the trial in place. All trials are removed and the cut bone surfaces prepared with pulsatile lavage. Cement is mixed and once ready for implantation, the size 5 tibial implant, size  5 posterior stabilized femoral component, and the size 41 patella are cemented in place and the patella is held with the clamp. The trial insert is placed and the knee held in full extension. The Exparel (20 ml mixed with 30 ml saline) and .25% Bupivicaine, are injected into the extensor mechanism, posterior capsule, medial and lateral gutters and subcutaneous tissues.  All extruded cement is removed and once the cement is hard the permanent 12.5 mm posterior stabilized rotating platform insert is placed into the tibial tray.      The wound is copiously irrigated with saline solution and the extensor mechanism closed over a hemovac drain with #1 V-loc suture. The tourniquet is released for a total tourniquet time of 36  minutes. Flexion against gravity is 140 degrees and the patella tracks normally. Subcutaneous tissue is closed with 2.0 vicryl and subcuticular with running 4.0 Monocryl. The incision is cleaned and dried and steri-strips and a bulky sterile dressing are applied. The limb is placed into a knee immobilizer and the patient is awakened and transported to recovery in stable condition.      Please note that a surgical assistant was a medical necessity for this procedure in order to perform it in a safe and expeditious manner. Surgical assistant was necessary to retract the ligaments and vital neurovascular structures to prevent injury to them and also necessary for proper positioning of the limb to allow for anatomic placement of the prosthesis.   Gus RankinFrank V. Alixandrea Milleson, MD    09/21/2014, 9:17 AM

## 2014-09-21 NOTE — Anesthesia Preprocedure Evaluation (Signed)
Anesthesia Evaluation  Patient identified by MRN, date of birth, ID band Patient awake    Reviewed: Allergy & Precautions, H&P , NPO status , Patient's Chart, lab work & pertinent test results, reviewed documented beta blocker date and time   Airway Mallampati: II  TM Distance: >3 FB Neck ROM: Full    Dental  (+) Teeth Intact   Pulmonary neg pulmonary ROS,  breath sounds clear to auscultation        Cardiovascular Rhythm:Regular Rate:Normal  EKG- bifasicular block   Neuro/Psych negative neurological ROS  negative psych ROS   GI/Hepatic Neg liver ROS, GERD-  Medicated and Controlled,  Endo/Other  negative endocrine ROS  Renal/GU negative Renal ROS  negative genitourinary   Musculoskeletal negative musculoskeletal ROS (+)   Abdominal   Peds negative pediatric ROS (+)  Hematology negative hematology ROS (+)   Anesthesia Other Findings   Reproductive/Obstetrics negative OB ROS                             Anesthesia Physical  Anesthesia Plan  ASA: III  Anesthesia Plan: Spinal   Post-op Pain Management:    Induction:   Airway Management Planned: Simple Face Mask  Additional Equipment:   Intra-op Plan:   Post-operative Plan:   Informed Consent: I have reviewed the patients History and Physical, chart, labs and discussed the procedure including the risks, benefits and alternatives for the proposed anesthesia with the patient or authorized representative who has indicated his/her understanding and acceptance.     Plan Discussed with: CRNA  Anesthesia Plan Comments:         Anesthesia Quick Evaluation

## 2014-09-21 NOTE — Anesthesia Procedure Notes (Signed)
Spinal Patient location during procedure: OR Staffing Anesthesiologist: Ruthel Martine Performed by: anesthesiologist  Preanesthetic Checklist Completed: patient identified, site marked, surgical consent, pre-op evaluation, timeout performed, IV checked, risks and benefits discussed and monitors and equipment checked Spinal Block Patient position: sitting Prep: Betadine Patient monitoring: heart rate, continuous pulse ox and blood pressure Approach: right paramedian Location: L4-5 Injection technique: single-shot Needle Needle type: Spinocan  Needle gauge: 22 G Needle length: 9 cm Additional Notes Expiration date of kit checked and confirmed. Patient tolerated procedure well, without complications.     

## 2014-09-21 NOTE — H&P (View-Only) (Signed)
Jeffery Smith DOB: 1925-08-17 Widowed / Language: [place] / Race: White Male Date of Admission:  09/21/2014 CC:  Right Knee Pain History of Present Illness  The patient is a 80 year old male who comes in for a preoperative History and Physical. The patient is scheduled for a right total knee arthroplasty to be performed by Dr. Gus RankinFrank V. Aluisio, MD at [facility] on 09-21-2014. The patient is a 80 year old male who presents today for follow up of their knee. The patient is being followed for their right knee pain and osteoarthritis. They are now 1 year(s) (and 6 months) out from a Synvisc series. Symptoms reported today include: pain (especially with stairs), while the patient does not report symptoms of: swelling or stiffness. The patient feels that they are doing poorly and report their pain level to be mild to moderate. The following medication has been used for pain control: Ibuprofen. The patient has reported improvement of their symptoms with: viscosupplementation. he said the knee is getting progressively worse. He is extremely active for his age and still rides a tractor everyday. 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 He feels like the knee is slowing him down otherwise. He is at a stage now where it hurts him all the time and it is limiting what he can and cannot do outside of his work. He is still doing the work but feels it is getting much harder to do so. He has had cortisone and viscosupplements in the past. The cortisone has helped for a very short amount of time. The viscosupplements did not provide a tremendous amount of benefit. They have been treated conservatively in the past for the above stated problem and despite conservative measures, they continue to have progressive pain and severe functional limitations and dysfunction. They have failed  non-operative management including home exercise, medications, and injections. It is felt that they would benefit from undergoing total joint replacement. Risks and benefits of the procedure have been discussed with the patient and they elect to proceed with surgery. There are no active contraindications to surgery such as ongoing infection or rapidly progressive neurological disease.  Problem List/Past Medical  Spinal Stenosis, Lumbar (724.02) Radiculitis, Thoracic or Lumbar (724.4) Osteoarthritis, knee (M17.9) Spondylosis, Lumbosacral (721.3) Primary osteoarthritis of knee, right Sleep Apnea Skin Cancer Melanoma (Back Area) Cardiac Arrhythmia  "Irregular Heart Rate" Prostate Disease BPH Carotid Artery Stenosis Scarlet Fever Age 89 Measles Mumps Impaired Hearing  Allergies  Tetanus Toxoid Adsorbed *TOXOIDS*  Family History Drug / Alcohol Addiction Brother. Cancer Brother, Mother. mother and brother Rheumatoid Arthritis Maternal Grandmother, Mother. mother and sister  Social History  Exercise Exercises daily; does gym / [employer] Current work status retired No history of drug/alcohol rehab Marital status widowed Current drinker 03/03/2014: Currently drinks wine 5-7 times per week Children 3 Not under pain contract Tobacco / smoke exposure 03/03/2014: no, no Number of flights of stairs before winded 2-3 Tobacco use Never smoker. 03/03/2014, never smoker Alcohol use current drinker; drinks wine; less than 5 per week Illicit drug use no Drug/Alcohol Rehab (Currently) no Drug/Alcohol Rehab (Previously) no Pain Contract no Living situation live alone Advance Directives Living Will  Medication History Finasteride (5MG  Tablet, Oral) Active. Omeprazole (20MG  Capsule DR, Oral) Active. Aspirin EC (81MG  Tablet DR, Oral) Active. Ibuprofen (200MG  Tablet, Oral) Active. DiphenhydrAMINE HCl (25MG  Tablet, Oral) Active. Simvastatin (  80MG Tablet,  Oral) Active.  Past Surgical History Carotid Artery Surgery right Colon Polyp Removal - Colonoscopy Cataract Surgery bilateral Total Knee Replacement left Tonsillectomy  Review of Systems  General Not Present- Chills, Fatigue, Fever, Memory Loss, Night Sweats, Weight Gain and Weight Loss. Skin Not Present- Eczema, Hives, Itching, Lesions and Rash. HEENT Present- Hearing problems. Not Present- Dentures, Double Vision, Headache, Hearing Loss, Tinnitus and Visual Loss. Respiratory Not Present- Allergies, Chronic Cough, Coughing up blood, Shortness of breath at rest and Shortness of breath with exertion. Cardiovascular Not Present- Chest Pain, Difficulty Breathing Lying Down, Murmur, Palpitations, Racing/skipping heartbeats and Swelling. Gastrointestinal Not Present- Abdominal Pain, Bloody Stool, Constipation, Diarrhea, Difficulty Swallowing, Heartburn, Jaundice, Loss of appetitie, Nausea and Vomiting. Male Genitourinary Not Present- Blood in Urine, Discharge, Flank Pain, Incontinence, Painful Urination, Urgency, Urinary frequency, Urinary Retention, Urinating at Night and Weak urinary stream. Musculoskeletal Present- Joint Pain. Not Present- Back Pain, Joint Swelling, Morning Stiffness, Muscle Pain, Muscle Weakness and Spasms. Neurological Not Present- Blackout spells, Difficulty with balance, Dizziness, Paralysis, Tremor and Weakness. Psychiatric Not Present- Insomnia.  Vitals  Weight: 180 lb Height: 71in Weight was reported by patient. Height was reported by patient. Body Surface Area: 2.02 m Body Mass Index: 25.1 kg/m  BP: 158/82 (Sitting, Left Arm, Standard)   Physical Exam General Mental Status -Alert, cooperative and good historian. General Appearance-pleasant, Not in acute distress. Orientation-Oriented X3. Build & Nutrition-Well nourished and Well developed.  Head and Neck Head-normocephalic, atraumatic . Neck Global Assessment - supple, no bruit  auscultated on the right, no bruit auscultated on the left.  Eye Vision-Wears corrective lenses. Pupil - Bilateral-Regular and Round. Motion - Bilateral-EOMI.  Chest and Lung Exam Auscultation Breath sounds - clear at anterior chest wall and clear at posterior chest wall. Adventitious sounds - No Adventitious sounds.  Cardiovascular Auscultation Rhythm - Regular rate and rhythm. Heart Sounds - S1 WNL and S2 WNL. Murmurs & Other Heart Sounds: Murmur 1 - Location - Aortic Area. Timing - Early systolic. Grade - II/VI. Character - Low pitched.  Abdomen Palpation/Percussion Tenderness - Abdomen is non-tender to palpation. Rigidity (guarding) - Abdomen is soft. Auscultation Auscultation of the abdomen reveals - Bowel sounds normal.  Male Genitourinary Note: Not done, not pertinent to present illness   Musculoskeletal Note: On exam, he is alert and oriented in no apparent distress. His right hip shows normal range of motion with no discomfort. His right knee shows no effusion. He has a varus deformity. His range is about 5-120 with marked crepitus on range of motion. Tenderness medial greater than lateral and no instability noted. Pulses are trace palpable. Sensation and motor are intact.  RADIOGRAPHS: AP both knees and lateral show the prosthesis on the left is in good position with no abnormalities. On the right he has severe end stage bone on bone arthritis with some grooving of the tibia and femur in the medial compartment and large osteophyte formation. He also has severe osteophytes at the patellofemoral joint.   Assessment & Plan  Primary osteoarthritis of knee, right  Note:Plan is for a Right Total Knee Replacement by Dr. Aluisio.  Plan is to go to rehab versus home with 24 hour home care.  PCP - [facility] - Patient has been seen preoperatively and felt to be stable for surgery. [address] [place] [address] [phone]  Topical TXA  - Carotid Disease  Signed electronically by Almira Phetteplace L Leshawn Houseworth, III PA-C 

## 2014-09-21 NOTE — Interval H&P Note (Signed)
History and Physical Interval Note:  09/21/2014 7:09 AM  Jeffery Smith  has presented today for surgery, with the diagnosis of right knee osteoarthritis  The various methods of treatment have been discussed with the patient and family. After consideration of risks, benefits and other options for treatment, the patient has consented to  Procedure(s): RIGHT TOTAL KNEE ARTHROPLASTY (Right) as a surgical intervention .  The patient's history has been reviewed, patient examined, no change in status, stable for surgery.  I have reviewed the patient's chart and labs.  Questions were answered to the patient's satisfaction.     Loanne DrillingALUISIO,Sury Wentworth V

## 2014-09-21 NOTE — Plan of Care (Signed)
Problem: Consults Goal: Diagnosis- Total Joint Replacement Right total knee     

## 2014-09-21 NOTE — Evaluation (Signed)
Physical Therapy Evaluation Patient Details Name: Jeffery PulseFrederick M Mauriello MRN: [id number] DOB: August 19, 1925 Today's Date: 09/21/2014   History of Present Illness  pt with history of LTKA about 10 years ago, now s/p RTKA  Clinical Impression  Pt with RTKA present with decreased ROM and strength to benefit from PT for mobility and exercies to return home alone eventually safely and MODI.      Follow Up Recommendations SNF (pt plannign on heading to rehab for a few days in order to get independt secondary no one at home to help him. )    Equipment Recommendations  None recommended by PT (pt has equipment from previous knee surgery)    Recommendations for Other Services       Precautions / Restrictions Precautions Precautions: Knee Required Braces or Orthoses: Knee Immobilizer - Right Knee Immobilizer - Right: Discontinue once straight leg raise with < 10 degree lag Restrictions Weight Bearing Restrictions: No (WBAT)      Mobility  Bed Mobility Overal bed mobility: Needs Assistance Bed Mobility: Supine to Sit;Sit to Supine     Supine to sit: Min assist     General bed mobility comments: for assisting RLE   Transfers Overall transfer level: Needs assistance Equipment used: Standard walker Transfers: Sit to/from Stand Sit to Stand: Min assist         General transfer comment: cues for hand placment and RW safety   Ambulation/Gait Ambulation/Gait assistance: Min guard Ambulation Distance (Feet): 20 Feet Assistive device: Standard walker Gait Pattern/deviations: Step-to pattern     General Gait Details: cues for RW sequencing and steps   Stairs            Wheelchair Mobility    Modified Rankin (Stroke Patients Only)       Balance                                             Pertinent Vitals/Pain Pain Assessment: 0-10 Pain Score: 4  Pain Location: R knee Pain Descriptors / Indicators: Aching Pain Intervention(s): Monitored during  session;Ice applied Banker(RN gave some medication after session )    Home Living Family/patient expects to be discharged to:: Skilled nursing facility Living Arrangements: Alone                    Prior Function Level of Independence: Independent         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 No AD PLOF.      Hand Dominance        Extremity/Trunk Assessment               Lower Extremity Assessment: RLE deficits/detail RLE Deficits / Details: grossly 0-40 degrees AAROM for knee flexion in supine , active quad set and minimal assitance on SLR today       Communication   Communication: No difficulties;HOH  Cognition Arousal/Alertness: Awake/alert Behavior During Therapy: WFL for tasks assessed/performed Overall Cognitive Status: Within Functional Limits for tasks assessed                      General Comments      Exercises Total Joint Exercises Ankle Circles/Pumps: AROM;Right;5 reps;Supine Quad Sets: AROM;Right;10 reps;Supine Heel Slides: AAROM;Right;10 reps;Supine Straight Leg Raises: AAROM;Right;10 reps;Supine Goniometric ROM: 0-40 supine      Assessment/Plan  PT Assessment Patient needs continued PT services  PT Diagnosis Difficulty walking   PT Problem List Decreased strength;Decreased range of motion;Decreased activity tolerance;Decreased mobility;Decreased knowledge of use of DME  PT Treatment Interventions DME instruction;Gait training;Functional mobility training;Therapeutic activities;Therapeutic exercise   PT Goals (Current goals can be found in the Care Plan section) Acute Rehab PT Goals Patient Stated Goal: I want to get to walign better with less pain again and be active on the farm with with his dogs PT Goal Formulation: With patient Time For Goal Achievement: 10/05/14    Frequency 7X/week   Barriers to discharge        Co-evaluation                End of Session Equipment Utilized During Treatment: Gait belt Activity Tolerance: Patient tolerated treatment well Patient left: in chair;with call bell/phone within reach Nurse Communication: Mobility status         Time: 1710-1748 PT Time Calculation (min) (ACUTE ONLY): 38 min   Charges:   PT Evaluation $Initial PT Evaluation Tier I: 1 Procedure PT Treatments $Gait Training: 8-22 mins $Therapeutic Activity: 8-22 mins   PT G CodesMarella Bile:          Laityn Bensen 09/21/2014, 6:14 PM  Marella BileSharron Trude Cansler, PT Pager: [phone] 09/21/2014

## 2014-09-22 ENCOUNTER — Encounter (HOSPITAL_COMMUNITY): Payer: Self-pay | Admitting: Orthopedic Surgery

## 2014-09-22 LAB — CBC
HCT: 32.3 % — ABNORMAL LOW (ref 39.0–52.0)
Hemoglobin: 10.7 g/dL — ABNORMAL LOW (ref 13.0–17.0)
MCH: 31.2 pg (ref 26.0–34.0)
MCHC: 33.1 g/dL (ref 30.0–36.0)
MCV: 94.2 fL (ref 78.0–100.0)
PLATELETS: 156 10*3/uL (ref 150–400)
RBC: 3.43 MIL/uL — AB (ref 4.22–5.81)
RDW: 13.4 % (ref 11.5–15.5)
WBC: 13.2 10*3/uL — ABNORMAL HIGH (ref 4.0–10.5)

## 2014-09-22 LAB — BASIC METABOLIC PANEL
ANION GAP: 11 (ref 5–15)
BUN: 20 mg/dL (ref 6–23)
CO2: 22 meq/L (ref 19–32)
Calcium: 8.7 mg/dL (ref 8.4–10.5)
Chloride: 105 mEq/L (ref 96–112)
Creatinine, Ser: 1.12 mg/dL (ref 0.50–1.35)
GFR calc Af Amer: 66 mL/min — ABNORMAL LOW (ref >90)
GFR, EST NON AFRICAN AMERICAN: 57 mL/min — AB (ref >90)
Glucose, Bld: 133 mg/dL — ABNORMAL HIGH (ref 70–99)
POTASSIUM: 4.4 meq/L (ref 3.7–5.3)
Sodium: 138 mEq/L (ref 137–147)

## 2014-09-22 MED ORDER — NON FORMULARY: 20.0000 mg | Freq: Every day | Status: DC

## 2014-09-22 MED ORDER — OMEPRAZOLE 20 MG PO CPDR
20.0000 mg | DELAYED_RELEASE_CAPSULE | Freq: Every day | ORAL | Status: DC
  Administered 2014-09-22: 20 mg via ORAL
  Filled 2014-09-22 (×2): qty 1

## 2014-09-22 NOTE — Evaluation (Signed)
Occupational Therapy Evaluation Patient Details Name: Jeffery Smith MRN: [id number] DOB: October 20, 1924 Today's Date: 09/22/2014    History of Present Illness pt with history of LTKA about 10 years ago, now s/p RTKA   Clinical Impression   Pt practiced with AE for LB self care. He is planning SNF. Will follow to progress ADL independence for next venue.    Follow Up Recommendations  SNF;Supervision/Assistance - 24 hour    Equipment Recommendations  None recommended by OT    Recommendations for Other Services       Precautions / Restrictions Precautions Precautions: Knee Required Braces or Orthoses: Knee Immobilizer - Right Knee Immobilizer - Right: Discontinue once straight leg raise with < 10 degree lag Restrictions Weight Bearing Restrictions: No      Mobility Bed Mobility                  Transfers Overall transfer level: Needs assistance Equipment used: Rolling walker (2 wheeled) Transfers: Sit to/from Stand Sit to Stand: Min assist         General transfer comment: verbal cues for hand placement.    Balance                                            ADL Overall ADL's : Needs assistance/impaired Eating/Feeding: Independent;Sitting   Grooming: Set up;Wash/dry hands;Sitting   Upper Body Bathing: Set up;Sitting   Lower Body Bathing: Moderate assistance;Sit to/from stand   Upper Body Dressing : Set up;Sitting   Lower Body Dressing: Moderate assistance;Sit to/from stand   Toilet Transfer: Minimal Insurance claims handler Details (indicate cue type and reason): sit to stand only Toileting- Clothing Manipulation and Hygiene: Minimal assistance;Sit to/from stand         General ADL Comments: Introduced AE to pt and daughter and he is interested in obtaining the kit. Pt practiced with sock aid and was able to don R sock with min assist. Stood with min assist and simualted clothing management.     Vision                      Perception     Praxis      Pertinent Vitals/Pain Pain Assessment: 0-10 Pain Score: 6  Pain Location: R knee Pain Descriptors / Indicators: Aching Pain Intervention(s): Repositioned;Ice applied     Hand Dominance     Extremity/Trunk Assessment Upper Extremity Assessment Upper Extremity Assessment: Overall WFL for tasks assessed           Communication Communication Communication: No difficulties;HOH   Cognition Arousal/Alertness: Awake/alert Behavior During Therapy: WFL for tasks assessed/performed Overall Cognitive Status: Within Functional Limits for tasks assessed                     General Comments       Exercises       Shoulder Instructions      Home Living Family/patient expects to be discharged to:: Skilled nursing facility Living Arrangements: Alone                                      Prior Functioning/Environment Level of Independence: Independent        Comments: still works his active large acre farm, raises show dogs, indepent with driving and meals,  etc. No AD PLOF.     OT Diagnosis: Generalized weakness   OT Problem List: Decreased strength;Decreased knowledge of use of DME or AE   OT Treatment/Interventions: Self-care/ADL training;Patient/family education;Therapeutic activities;DME and/or AE instruction    OT Goals(Current goals can be found in the care plan section) Acute Rehab OT Goals Patient Stated Goal: return home OT Goal Formulation: With patient/family Time For Goal Achievement: 09/29/14 Potential to Achieve Goals: Good  OT Frequency: Min 2X/week   Barriers to D/C:            Co-evaluation              End of Session Equipment Utilized During Treatment: Rolling walker;Right knee immobilizer CPM Right Knee CPM Right Knee: Off  Activity Tolerance: Patient tolerated treatment well Patient left: in chair;with call bell/phone within reach;with family/visitor present    Time: 1007-1047 OT Time Calculation (min): 40 min Charges:  OT General Charges $OT Visit: 1 Procedure OT Evaluation $Initial OT Evaluation Tier I: 1 Procedure OT Treatments $Self Care/Home Management : 8-22 mins $Therapeutic Activity: 8-22 mins G-Codes:    Jules Schick  [phone] 09/22/2014, 12:08 PM

## 2014-09-22 NOTE — Discharge Instructions (Addendum)
° °Dr. Frank Aluisio °Total Joint Specialist °[facility] °[address] °[place], [place] [address] °[phone] ° °TOTAL KNEE REPLACEMENT POSTOPERATIVE DIRECTIONS ° ° ° °Knee Rehabilitation, Guidelines Following Surgery  °Results after knee surgery are often greatly improved when you follow the exercise, range of motion and muscle strengthening exercises prescribed by your doctor. Safety measures are also important to protect the knee from further injury. Any time any of these exercises cause you to have increased pain or swelling in your knee joint, decrease the amount until you are comfortable again and slowly increase them. If you have problems or questions, call your caregiver or physical therapist for advice.  ° °HOME CARE INSTRUCTIONS  °Remove items at home which could result in a fall. This includes throw rugs or furniture in walking pathways.  °Continue medications as instructed at time of discharge. °You may have some home medications which will be placed on hold until you complete the course of blood thinner medication.  °You may start showering once you are discharged home but do not submerge the incision under water. Just pat the incision dry and apply a dry gauze dressing on daily. °Walk with walker as instructed.  °You may resume a sexual relationship in one month or when given the OK by  your doctor.  °· Use walker as long as suggested by your caregivers. °· Avoid periods of inactivity such as sitting longer than an hour when not asleep. This helps prevent blood clots.  °You may put full weight on your legs and walk as much as is comfortable.  °You may return to work once you are cleared by your doctor.  °Do not drive a car for 6 weeks or until released by you surgeon.  °· Do not drive while taking narcotics.  °Wear the elastic stockings for three weeks following surgery during the day but you may remove then at night. °Make sure you keep all of your appointments after your  operation with all of your doctors and caregivers. You should call the office at the above phone number and make an appointment for approximately two weeks after the date of your surgery. °Change the dressing daily and reapply a dry dressing each time. °Please pick up a stool softener and laxative for home use as long as you are requiring pain medications. °· ICE to the affected knee every three hours for 30 minutes at a time and then as needed for pain and swelling.  Continue to use ice on the knee for pain and swelling from surgery. You may notice swelling that will progress down to the foot and ankle.  This is normal after surgery.  Elevate the leg when you are not up walking on it.   °It is important for you to complete the blood thinner medication as prescribed by your doctor. °· Continue to use the breathing machine which will help keep your temperature down.  It is common for your temperature to cycle up and down following surgery, especially at night when you are not up moving around and exerting yourself.  The breathing machine keeps your lungs expanded and your temperature down. ° °RANGE OF MOTION AND STRENGTHENING EXERCISES  °Rehabilitation of the knee is important following a knee injury or an operation. After just a few days of immobilization, the muscles of the thigh which control the knee become weakened and shrink (atrophy). Knee exercises are designed to build up the tone and strength of the thigh muscles and to improve knee   motion. Often times heat used for twenty to thirty minutes before working out will loosen up your tissues and help with improving the range of motion but do not use heat for the first two weeks following surgery. These exercises can be done on a training (exercise) mat, on the floor, on a table or on a bed. Use what ever works the best and is most comfortable for you Knee exercises include:  °Leg Lifts - While your knee is still immobilized in a splint or cast, you can do  straight leg raises. Lift the leg to 60 degrees, hold for 3 sec, and slowly lower the leg. Repeat 10-20 times 2-3 times daily. Perform this exercise against resistance later as your knee gets better.  °Quad and Hamstring Sets - Tighten up the muscle on the front of the thigh (Quad) and hold for 5-10 sec. Repeat this 10-20 times hourly. Hamstring sets are done by pushing the foot backward against an object and holding for 5-10 sec. Repeat as with quad sets.  °A rehabilitation program following serious knee injuries can speed recovery and prevent re-injury in the future due to weakened muscles. Contact your doctor or a physical therapist for more information on knee rehabilitation.  ° °SKILLED REHAB INSTRUCTIONS: °If the patient is transferred to a skilled rehab facility following release from the hospital, a list of the current medications will be sent to the facility for the patient to continue.  When discharged from the skilled rehab facility, please have the facility set up the patient's Home Health Physical Therapy prior to being released. Also, the skilled facility will be responsible for providing the patient with their medications at time of release from the facility to include their pain medication, the muscle relaxants, and their blood thinner medication. If the patient is still at the rehab facility at time of the two week follow up appointment, the skilled rehab facility will also need to assist the patient in arranging follow up appointment in our office and any transportation needs. ° °MAKE SURE YOU:  °Understand these instructions.  °Will watch your condition.  °Will get help right away if you are not doing well or get worse.  ° ° °Pick up stool softner and laxative for home use following surgery while on pain medications. °Do not submerge incision under water. °Please use good hand washing techniques while changing dressing each day. °May shower starting three days after surgery. °Please use a clean  towel to pat the incision dry following showers. °Continue to use ice for pain and swelling after surgery. °Do not use any lotions or creams on the incision until instructed by your surgeon. ° ° °Take Xarelto for two and a half more weeks, then discontinue Xarelto. °Once the patient has completed the Xarelto, they may resume the 81 mg Aspirin. ° °Postoperative Constipation Protocol ° °Constipation - defined medically as fewer than three stools per week and severe constipation as less than one stool per week. ° °One of the most common issues patients have following surgery is constipation.  Even if you have a regular bowel pattern at home, your normal regimen is likely to be disrupted due to multiple reasons following surgery.  Combination of anesthesia, postoperative narcotics, change in appetite and fluid intake all can affect your bowels.  In order to avoid complications following surgery, here are some recommendations in order to help you during your recovery period. ° °Colace (docusate) - Pick up an over-the-counter form of Colace or another stool softener and   take twice a day as long as you are requiring postoperative pain medications.  Take with a full glass of water daily.  If you experience loose stools or diarrhea, hold the colace until you stool forms back up.  If your symptoms do not get better within 1 week or if they get worse, check with your doctor. ° °Dulcolax (bisacodyl) - Pick up over-the-counter and take as directed by the product packaging as needed to assist with the movement of your bowels.  Take with a full glass of water.  Use this product as needed if not relieved by Colace only.  ° °MiraLax (polyethylene glycol) - Pick up over-the-counter to have on hand.  MiraLax is a solution that will increase the amount of water in your bowels to assist with bowel movements.  Take as directed and can mix with a glass of water, juice, soda, coffee, or tea.  Take if you go more than two days without a  movement. °Do not use MiraLax more than once per day. Call your doctor if you are still constipated or irregular after using this medication for 7 days in a row. ° °If you continue to have problems with postoperative constipation, please contact the office for further assistance and recommendations.  If you experience "the worst abdominal pain ever" or develop nausea or vomiting, please contact the office immediatly for further recommendations for treatment. ° ° °Information on my medicine - XARELTO® (Rivaroxaban) ° °This medication education was reviewed with me or my healthcare representative as part of my discharge preparation.  The pharmacist that spoke with me during my hospital stay was:  Zeigler, Dustin George, RPH ° °Why was Xarelto® prescribed for you? °Xarelto® was prescribed for you to reduce the risk of blood clots forming after orthopedic surgery. The medical term for these abnormal blood clots is venous thromboembolism (VTE). ° °What do you need to know about xarelto® ? °Take your Xarelto® ONCE DAILY at the same time every day. °You may take it either with or without food. ° °If you have difficulty swallowing the tablet whole, you may crush it and mix in applesauce just prior to taking your dose. ° °Take Xarelto® exactly as prescribed by your doctor and DO NOT stop taking Xarelto® without talking to the doctor who prescribed the medication.  Stopping without other VTE prevention medication to take the place of Xarelto® may increase your risk of developing a clot. ° °After discharge, you should have regular check-up appointments with your healthcare provider that is prescribing your Xarelto®.   ° °What do you do if you miss a dose? °If you miss a dose, take it as soon as you remember on the same day then continue your regularly scheduled once daily regimen the next day. Do not take two doses of Xarelto® on the same day.  ° °Important Safety Information °A possible side effect of Xarelto® is bleeding.  You should call your healthcare provider right away if you experience any of the following: °? Bleeding from an injury or your nose that does not stop. °? Unusual colored urine (red or dark brown) or unusual colored stools (red or black). °? Unusual bruising for unknown reasons. °? A serious fall or if you hit your head (even if there is no bleeding). ° °Some medicines may interact with Xarelto® and might increase your risk of bleeding while on Xarelto®. To help avoid this, consult your healthcare provider or pharmacist prior to using any new prescription or non-prescription medications, including herbals,   vitamins, non-steroidal anti-inflammatory drugs (NSAIDs) and supplements. ° °This website has more information on Xarelto®: www.xarelto.com. ° ° ° °

## 2014-09-22 NOTE — Progress Notes (Signed)
Clinical Social Work Department CLINICAL SOCIAL WORK PLACEMENT NOTE 09/22/2014  Patient:  Espinal,Fitzgerald M  Account Number:  [id number] Admit date:  09/21/2014  Clinical Social Worker:  Aryaan Persichetti, LCSW  Date/time:  09/22/2014 12:23 PM  Clinical Social Work is seeking post-discharge placement for this patient at the following level of care:   SKILLED NURSING   (*CSW will update this form in Epic as items are completed)     Patient/family provided with [facility] Department of Clinical Social Work's list of facilities offering this level of care within the geographic area requested by the patient (or if unable, by the patient's family).  09/22/2014  Patient/family informed of their freedom to choose among providers that offer the needed level of care, that participate in Medicare, Medicaid or managed care program needed by the patient, have an available bed and are willing to accept the patient.  09/22/2014  Patient/family informed of [facility]' ownership interest in [facility], as well as of the fact that they are under no obligation to receive care at this facility.  PASARR submitted to EDS on 09/21/2014 PASARR number received on 09/21/2014  FL2 transmitted to all facilities in geographic area requested by pt/family on  09/22/2014 FL2 transmitted to all facilities within larger geographic area on   Patient informed that his/her managed care company has contracts with or will negotiate with  certain facilities, including the following:     Patient/family informed of bed offers received:  09/22/2014 Patient chooses bed at [facility] Physician recommends and patient chooses bed at    Patient to be transferred to  on   Patient to be transferred to facility by  Patient and family notified of transfer on  Name of family member notified:    The following physician request were entered in Epic:   Additional Comments:  Shiro Ellerman LCSW  [phone] 

## 2014-09-22 NOTE — Progress Notes (Signed)
Physical Therapy Treatment Patient Details Name: Jeffery PulseFrederick M Starnes MRN: [id number] DOB: Oct 22, 1924 Today's Date: 09/22/2014    History of Present Illness pt with history of LTKA about 10 years ago, now s/p RTKA    PT Comments    POD # 1 pm session.  Assited OOB to amb in hallway then back to bed. Pt remains unsteady and limited amb distance due to pain 9/10 and MAX c/o fatigue.  Pt will need ST Rehab at SNF prior to going home.   Follow Up Recommendations  SNF     Equipment Recommendations       Recommendations for Other Services       Precautions / Restrictions Precautions Precautions: Knee;Fall Precaution Comments: Instructed pt on KI use for amb Required Braces or Orthoses: Knee Immobilizer - Right Knee Immobilizer - Right: Discontinue once straight leg raise with < 10 degree lag Restrictions Weight Bearing Restrictions: No    Mobility  Bed Mobility Overal bed mobility: Needs Assistance Bed Mobility: Sit to Supine     Supine to sit: Min assist     General bed mobility comments: for assisting RLE and 25% VC's on proper tech.  Assisted back to bed.  Transfers Overall transfer level: Needs assistance Equipment used: Rolling walker (2 wheeled) Transfers: Sit to/from Stand Sit to Stand: Min assist         General transfer comment: 50% VC's on proper tech and hand placement.  Ambulation/Gait Ambulation/Gait assistance: Min assist;Mod assist Ambulation Distance (Feet): 25 Feet Assistive device: Rolling walker (2 wheeled) Gait Pattern/deviations: Step-to pattern;Decreased stance time - right;Trunk flexed;Antalgic Gait velocity: decreased   General Gait Details: 50% VC's on proper sequencing and proper walker to self distance.  Very unsteady gait/impulsive.  LOB x 1 during trun around.   Stairs            Wheelchair Mobility    Modified Rankin (Stroke Patients Only)       Balance                                     Cognition Arousal/Alertness: Awake/alert Behavior During Therapy: WFL for tasks assessed/performed Overall Cognitive Status: Within Functional Limits for tasks assessed                      Exercises      General Comments        Pertinent Vitals/Pain Pain Assessment: 0-10 Pain Score: 5  Pain Location: R knee Pain Descriptors / Indicators: Constant;Aching;Sore Pain Intervention(s): Monitored during session;Premedicated before session;Repositioned;Ice applied    Home Living Family/patient expects to be discharged to:: Skilled nursing facility Living Arrangements: Alone                  Prior Function Level of Independence: Independent      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 No AD PLOF.    PT Goals (current goals can now be found in the care plan section) Acute Rehab PT Goals Patient Stated Goal: return home Progress towards PT goals: Progressing toward goals    Frequency  7X/week    PT Plan      Co-evaluation             End of Session Equipment Utilized During Treatment: Gait belt;Right knee immobilizer Activity Tolerance: Patient limited by fatigue;Patient limited by pain Patient left: in  chair;with call bell/phone within reach     Time: 1410-1425 PT Time Calculation (min) (ACUTE ONLY): 15 min  Charges:  $Gait Training: 8-22 mins                     G Codes:      Felecia ShellingLori Bayyinah Dukeman  PTA [facility]  Acute  Rehab Pager      [phone]

## 2014-09-22 NOTE — Progress Notes (Signed)
Clinical Social Work Department BRIEF PSYCHOSOCIAL ASSESSMENT 09/22/2014  Patient:  Jeffery Smith, Jeffery Smith     Account Number:  [id number]     Admit date:  09/21/2014  Clinical Social Worker:  Lacie Scotts  Date/Time:  09/22/2014 11:56 AM  Referred by:  Physician  Date Referred:  09/22/2014 Referred for  SNF Placement   Other Referral:   Interview type:  Patient Other interview type:    PSYCHOSOCIAL DATA Living Status:  ALONE Admitted from facility:   Level of care:   Primary support name:  Jeffery Smith Primary support relationship to patient:  CHILD, ADULT Degree of support available:   supportive    CURRENT CONCERNS Current Concerns  Post-Acute Placement   Other Concerns:    SOCIAL WORK ASSESSMENT / PLAN Pt is an 80 yr old gentleman living at home, alone, prior to hospitalization. CSW met with pt and daughter  to assist with d/c planning. This is planned admission. Pt has made prior arrangements to have ST Rehab at [facility] following hospital d/c. CSW has contacted SNF and d/c plan has been confirmed. Pt has [employer] which requires prior authorization. SNF has begun authorization process. CSW will follow to assist with d/c planning to SNF.   Assessment/plan status:  Psychosocial Support/Ongoing Assessment of Needs Other assessment/ plan:   Information/referral to community resources:   Insurance coverage for SNF and ambulance transport reviewed.    PATIENT'S/FAMILY'S RESPONSE TO PLAN OF CARE: Pt's mood is bright. He is motivated to work with therapy. Pt is looking forward to having rehab at [facility].    Werner Lean LCSW [phone]

## 2014-09-22 NOTE — Care Management Note (Signed)
    Page 1 of 1   09/22/2014     1:26:53 PM CARE MANAGEMENT NOTE 09/22/2014  Patient:  Juliann PulseSTRAWSON,Tanvir M   Account Number:  [id number]  Date Initiated:  09/22/2014  Documentation initiated by:  [facility] CampusJEFFRIES,Laveah Gloster  Subjective/Objective Assessment:   adm: RIGHT TOTAL KNEE ARTHROPLASTY (Right)     Action/Plan:   discharge planning   Anticipated DC Date:  09/23/2014   Anticipated DC Plan:  SKILLED NURSING FACILITY      DC Planning Services  CM consult      Choice offered to / List presented to:             Status of service:  Completed, signed off Medicare Important Message given?   (If response is "NO", the following Medicare IM given date fields will be blank) Date Medicare IM given:   Medicare IM given by:   Date Additional Medicare IM given:   Additional Medicare IM given by:    Discharge Disposition:  SKILLED NURSING FACILITY  Per UR Regulation:    If discussed at Long Length of Stay Meetings, dates discussed:    Comments:  09/22/14 13:25 CM notes pt to go to SNF for rehab; CSW arranging.  No other CM needs were communicated.  Freddy JakschSarah Julienne Vogler, BSN, CM [phone].

## 2014-09-22 NOTE — Progress Notes (Signed)
Physical Therapy Treatment Patient Details Name: Juliann PulseFrederick M Buelna MRN: [id number] DOB: 03-07-1925 Today's Date: 09/22/2014    History of Present Illness pt with history of LTKA about 10 years ago, now s/p RTKA    PT Comments    POD # 1 am session.  Applied KI and instructed on proper use.  Assited OOB to amb in hallway.  Very unsteady gait.  Impulsive Returned to room then performed TKR TE's followed by ICE.  Follow Up Recommendations  SNF     Equipment Recommendations       Recommendations for Other Services       Precautions / Restrictions Precautions Precautions: Knee;Fall Precaution Comments: Instructed pt on KI use for amb Required Braces or Orthoses: Knee Immobilizer - Right Knee Immobilizer - Right: Discontinue once straight leg raise with < 10 degree lag Restrictions Weight Bearing Restrictions: No    Mobility  Bed Mobility Overal bed mobility: Needs Assistance Bed Mobility: Supine to Sit     Supine to sit: Min assist     General bed mobility comments: for assisting RLE and 25% VC's on proper tech  Transfers Overall transfer level: Needs assistance Equipment used: Rolling walker (2 wheeled) Transfers: Sit to/from Stand Sit to Stand: Min assist         General transfer comment: 50% VC's on proper tech and hand placement.  Ambulation/Gait Ambulation/Gait assistance: Min assist;Mod assist Ambulation Distance (Feet): 22 Feet Assistive device: Rolling walker (2 wheeled) Gait Pattern/deviations: Step-to pattern;Decreased stance time - right;Trunk flexed;Antalgic Gait velocity: decreased   General Gait Details: 50% VC's on proper sequencing and proper walker to self distance.  Very unsteady gait/impulsive.  LOB x 1 during trun around.   Stairs            Wheelchair Mobility    Modified Rankin (Stroke Patients Only)       Balance                                    Cognition Arousal/Alertness: Awake/alert Behavior  During Therapy: WFL for tasks assessed/performed Overall Cognitive Status: Within Functional Limits for tasks assessed                      Exercises   Total Knee Replacement TE's 10 reps B LE ankle pumps 10 reps towel squeezes 10 reps knee presses 10 reps heel slides  10 reps SAQ's 10 reps SLR's 10 reps ABD Followed by ICE'   General Comments        Pertinent Vitals/Pain Pain Assessment: 0-10 Pain Score: 5  Pain Location: R knee Pain Descriptors / Indicators: Constant;Aching;Sore Pain Intervention(s): Monitored during session;Premedicated before session;Repositioned;Ice applied    Home Living Family/patient expects to be discharged to:: Skilled nursing facility Living Arrangements: Alone                  Prior Function Level of Independence: Independent      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 No AD PLOF.    PT Goals (current goals can now be found in the care plan section) Acute Rehab PT Goals Patient Stated Goal: return home Progress towards PT goals: Progressing toward goals    Frequency  7X/week    PT Plan      Co-evaluation             End of  Session Equipment Utilized During Treatment: Gait belt;Right knee immobilizer Activity Tolerance: Patient limited by fatigue;Patient limited by pain Patient left: in chair;with call bell/phone within reach     Time: 0916-0944 PT Time Calculation (min) (ACUTE ONLY): 28 min  Charges:  $Gait Training: 8-22 mins $Therapeutic Exercise: 8-22 mins                    G Codes:      Felecia ShellingLori Virgia Kelner  PTA [facility]  Acute  Rehab Pager      [phone]

## 2014-09-22 NOTE — Progress Notes (Signed)
   Subjective: 1 Day Post-Op Procedure(s) (LRB): RIGHT TOTAL KNEE ARTHROPLASTY (Right) Patient reports pain as mild.   Patient seen in rounds with Dr. Lequita HaltAluisio. Patient is well, but has had some minor complaints of pain in the knee, requiring pain medications We will start therapy today.  Plan is to go Skilled nursing facility after hospital stay.  Objective: Vital signs in last 24 hours: Temp:  [97.4 F (36.3 C)-98.8 F (37.1 C)] 97.7 F (36.5 C) (12/15 1400) Pulse Rate:  [73-85] 85 (12/15 1400) Resp:  [14-16] 16 (12/15 1400) BP: (116-135)/(64-83) 127/66 mmHg (12/15 1400) SpO2:  [92 %-100 %] 92 % (12/15 1400)  Intake/Output from previous day:  Intake/Output Summary (Last 24 hours) at 09/22/14 1905 Last data filed at 09/22/14 1700  Gross per 24 hour  Intake 2063.33 ml  Output   1315 ml  Net 748.33 ml    Intake/Output this shift:    Labs:  Recent Labs  09/22/14 0435  HGB 10.7*    Recent Labs  09/22/14 0435  WBC 13.2*  RBC 3.43*  HCT 32.3*  PLT 156    Recent Labs  09/22/14 0435  NA 138  K 4.4  CL 105  CO2 22  BUN 20  CREATININE 1.12  GLUCOSE 133*  CALCIUM 8.7   No results for input(s): LABPT, INR in the last 72 hours.  EXAM General - Patient is Alert and Appropriate Extremity - Neurovascular intact Sensation intact distally Dorsiflexion/Plantar flexion intact Dressing - dressing C/D/I Motor Function - intact, moving foot and toes well on exam.  Hemovac pulled without difficulty.  Past Medical History  Diagnosis Date  . Arthritis   . Sore throat   . Hyperlipidemia   . Benign head tremor   . Enlarged prostate   . GERD (gastroesophageal reflux disease)   . Dysrhythmia     irregular heart beat per patient- skips a beat   . Carotid artery occlusion     right side   . Hard of hearing     Assessment/Plan: 1 Day Post-Op Procedure(s) (LRB): RIGHT TOTAL KNEE ARTHROPLASTY (Right) Principal Problem:   OA (osteoarthritis) of  knee  Estimated body mass index is 24.41 kg/(m^2) as calculated from the following:   Height as of this encounter: 6\' 1"  (1.854 m).   Weight as of this encounter: 83.915 kg (185 lb). Up with therapy Plan for discharge tomorrow Discharge to SNF  DVT Prophylaxis - Xarelto Weight-Bearing as tolerated to right leg D/C O2 and Pulse OX and try on Room Air  Avel Peacerew Perkins, PA-C Orthopaedic Surgery 09/22/2014, 7:05 PM

## 2014-09-23 LAB — CBC
HCT: 30.9 % — ABNORMAL LOW (ref 39.0–52.0)
Hemoglobin: 10.5 g/dL — ABNORMAL LOW (ref 13.0–17.0)
MCH: 31.7 pg (ref 26.0–34.0)
MCHC: 34 g/dL (ref 30.0–36.0)
MCV: 93.4 fL (ref 78.0–100.0)
PLATELETS: 144 10*3/uL — AB (ref 150–400)
RBC: 3.31 MIL/uL — ABNORMAL LOW (ref 4.22–5.81)
RDW: 13.3 % (ref 11.5–15.5)
WBC: 11.5 10*3/uL — AB (ref 4.0–10.5)

## 2014-09-23 LAB — BASIC METABOLIC PANEL
ANION GAP: 13 (ref 5–15)
BUN: 17 mg/dL (ref 6–23)
CHLORIDE: 104 meq/L (ref 96–112)
CO2: 23 mEq/L (ref 19–32)
Calcium: 8.8 mg/dL (ref 8.4–10.5)
Creatinine, Ser: 1.04 mg/dL (ref 0.50–1.35)
GFR calc non Af Amer: 62 mL/min — ABNORMAL LOW (ref >90)
GFR, EST AFRICAN AMERICAN: 72 mL/min — AB (ref >90)
Glucose, Bld: 112 mg/dL — ABNORMAL HIGH (ref 70–99)
POTASSIUM: 4.3 meq/L (ref 3.7–5.3)
Sodium: 140 mEq/L (ref 137–147)

## 2014-09-23 MED ORDER — ACETAMINOPHEN 325 MG PO TABS: 650.0000 mg | ORAL_TABLET | Freq: Four times a day (QID) | ORAL | Status: DC | PRN

## 2014-09-23 MED ORDER — BISACODYL 10 MG RE SUPP: 10.0000 mg | Freq: Every day | RECTAL | Status: DC | PRN

## 2014-09-23 MED ORDER — POLYETHYLENE GLYCOL 3350 17 G PO PACK: 17.0000 g | PACK | Freq: Every day | ORAL | Status: DC | PRN

## 2014-09-23 MED ORDER — ONDANSETRON HCL 4 MG PO TABS: 4.0000 mg | ORAL_TABLET | Freq: Four times a day (QID) | ORAL | Status: DC | PRN

## 2014-09-23 MED ORDER — TRAMADOL HCL 50 MG PO TABS: 50.0000 mg | ORAL_TABLET | Freq: Four times a day (QID) | ORAL | Status: DC | PRN

## 2014-09-23 MED ORDER — METOCLOPRAMIDE HCL 5 MG PO TABS: 5.0000 mg | ORAL_TABLET | Freq: Three times a day (TID) | ORAL | Status: DC | PRN

## 2014-09-23 MED ORDER — OXYCODONE HCL 5 MG PO TABS: 5.0000 mg | ORAL_TABLET | ORAL | Status: DC | PRN

## 2014-09-23 MED ORDER — DSS 100 MG PO CAPS: 100.0000 mg | ORAL_CAPSULE | Freq: Two times a day (BID) | ORAL | Status: DC

## 2014-09-23 MED ORDER — METHOCARBAMOL 500 MG PO TABS: 500.0000 mg | ORAL_TABLET | Freq: Four times a day (QID) | ORAL | Status: DC | PRN

## 2014-09-23 MED ORDER — RIVAROXABAN 10 MG PO TABS: 10.0000 mg | ORAL_TABLET | Freq: Every day | ORAL | Status: DC

## 2014-09-23 NOTE — Progress Notes (Signed)
   Subjective: 2 Days Post-Op Procedure(s) (LRB): RIGHT TOTAL KNEE ARTHROPLASTY (Right) Patient reports pain as mild.   Patient seen in rounds with Dr. Lequita HaltAluisio. Patient is well, and has had no acute complaints or problems Patient is ready to go to the SNF - [facility]  Objective: Vital signs in last 24 hours: Temp:  [97.7 F (36.5 C)-99 F (37.2 C)] 99 F (37.2 C) (12/16 0400) Pulse Rate:  [82-91] 91 (12/16 0400) Resp:  [16-18] 18 (12/16 0400) BP: (127-135)/(66-77) 135/73 mmHg (12/16 0400) SpO2:  [92 %-98 %] 93 % (12/16 0400)  Intake/Output from previous day:  Intake/Output Summary (Last 24 hours) at 09/23/14 1008 Last data filed at 09/23/14 0530  Gross per 24 hour  Intake   1320 ml  Output   1150 ml  Net    170 ml     Labs:  Recent Labs  09/22/14 0435 09/23/14 0420  HGB 10.7* 10.5*    Recent Labs  09/22/14 0435 09/23/14 0420  WBC 13.2* 11.5*  RBC 3.43* 3.31*  HCT 32.3* 30.9*  PLT 156 144*    Recent Labs  09/22/14 0435 09/23/14 0420  NA 138 140  K 4.4 4.3  CL 105 104  CO2 22 23  BUN 20 17  CREATININE 1.12 1.04  GLUCOSE 133* 112*  CALCIUM 8.7 8.8   No results for input(s): LABPT, INR in the last 72 hours.  EXAM: General - Patient is Alert, Appropriate and Oriented Extremity - Neurovascular intact Sensation intact distally Dorsiflexion/Plantar flexion intact Incision - clean, dry, no drainage, healing Motor Function - intact, moving foot and toes well on exam.   Assessment/Plan: 2 Days Post-Op Procedure(s) (LRB): RIGHT TOTAL KNEE ARTHROPLASTY (Right) Procedure(s) (LRB): RIGHT TOTAL KNEE ARTHROPLASTY (Right) Past Medical History  Diagnosis Date  . Arthritis   . Sore throat   . Hyperlipidemia   . Benign head tremor   . Enlarged prostate   . GERD (gastroesophageal reflux disease)   . Dysrhythmia     irregular heart beat per patient- skips a beat   . Carotid artery occlusion     right side   . Hard of hearing    Principal  Problem:   OA (osteoarthritis) of knee  Estimated body mass index is 24.41 kg/(m^2) as calculated from the following:   Height as of this encounter: 6\' 1"  (1.854 m).   Weight as of this encounter: 83.915 kg (185 lb). Up with therapy Discharge to SNF Diet - Cardiac diet Follow up - in 2 weeks Activity - WBAT Disposition - Skilled nursing facility Condition Upon Discharge - Good D/C Meds - See DC Summary DVT Prophylaxis - Xarelto  Avel Peacerew Perkins, PA-C Orthopaedic Surgery 09/23/2014, 10:08 AM

## 2014-09-23 NOTE — Progress Notes (Signed)
Discharge summary sent to payer through [facility]  

## 2014-09-23 NOTE — Progress Notes (Signed)
Pt [facility]. Pt / daughter in agreement with d/c plan to SNF today. [facility] transport required. NSG reviewed d/c summary, scripts, avs. Scripts included in d/c packet.  Cori RazorJamie Vernell Townley LCSW [phone]

## 2014-09-23 NOTE — Progress Notes (Signed)
Physical Therapy Treatment Patient Details Name: Juliann PulseFrederick M Bomkamp MRN: [id number] DOB: 1925-01-19 Today's Date: 09/23/2014    History of Present Illness pt with history of LTKA about 10 years ago, now s/p RTKA    PT Comments    POD # 2 assisted pt OOB to amb in hallway then perform TKR TE's.  Pt required increased time.  Progressing slowly and plans to D/C to SNF.   Follow Up Recommendations  SNF     Equipment Recommendations       Recommendations for Other Services       Precautions / Restrictions Precautions Precautions: Knee;Fall Precaution Comments: Instructed pt on KI use for amb Required Braces or Orthoses: Knee Immobilizer - Right Knee Immobilizer - Right: Discontinue once straight leg raise with < 10 degree lag Restrictions Weight Bearing Restrictions: No    Mobility  Bed Mobility Overal bed mobility: Needs Assistance Bed Mobility: Supine to Sit     Supine to sit: Min assist     General bed mobility comments: for assisting RLE and 25% VC's on proper tech.  Assisted back to bed.  Transfers Overall transfer level: Needs assistance Equipment used: Rolling walker (2 wheeled) Transfers: Sit to/from Stand Sit to Stand: Min assist         General transfer comment: 50% VC's on proper tech and hand placement.  Ambulation/Gait Ambulation/Gait assistance: Min assist Ambulation Distance (Feet): 72 Feet Assistive device: Rolling walker (2 wheeled) Gait Pattern/deviations: Step-to pattern;Decreased stance time - right;Trunk flexed Gait velocity: decreased   General Gait Details: 50% VC's on proper sequencing and proper walker to self distance.  Very unsteady gait/impulsive.     Stairs            Wheelchair Mobility    Modified Rankin (Stroke Patients Only)       Balance                                    Cognition                            Exercises   Total Knee Replacement TE's 10 reps B LE ankle  pumps 10 reps towel squeezes 10 reps knee presses 10 reps heel slides  10 reps SAQ's 10 reps SLR's 10 reps ABD Followed by ICE     General Comments        Pertinent Vitals/Pain Pain Assessment: 0-10 Pain Score: 5  Pain Location: R knee Pain Descriptors / Indicators: Aching;Constant;Sore Pain Intervention(s): Monitored during session;Premedicated before session;Repositioned;Ice applied    Home Living                      Prior Function            PT Goals (current goals can now be found in the care plan section) Progress towards PT goals: Progressing toward goals    Frequency  7X/week    PT Plan      Co-evaluation             End of Session Equipment Utilized During Treatment: Gait belt;Right knee immobilizer Activity Tolerance: Patient limited by fatigue;Patient limited by pain Patient left: in chair;with call bell/phone within reach     Time: 1132-1211 PT Time Calculation (min) (ACUTE ONLY): 39 min  Charges:  $Gait Training: 8-22 mins $Therapeutic Exercise: 8-22 mins $Therapeutic Activity: 8-22 mins  G Codes:      Rica Koyanagi  PTA [facility]  Acute  Rehab Pager      [phone]

## 2014-09-23 NOTE — Discharge Summary (Signed)
Physician Discharge Summary   Patient ID: Jeffery Smith MRN: [id number] DOB/AGE: [birth year] 80 y.o.  Admit date: 09/21/2014 Discharge date: 09-23-2014  Primary Diagnosis:  Osteoarthritis Right knee(s)  Admission Diagnoses:  Past Medical History  Diagnosis Date  . Arthritis   . Sore throat   . Hyperlipidemia   . Benign head tremor   . Enlarged prostate   . GERD (gastroesophageal reflux disease)   . Dysrhythmia     irregular heart beat per patient- skips a beat   . Carotid artery occlusion     right side   . Hard of hearing    Discharge Diagnoses:   Principal Problem:   OA (osteoarthritis) of knee  Estimated body mass index is 24.41 kg/(m^2) as calculated from the following:   Height as of this encounter: _0  (1.854 m).   Weight as of this encounter: 83.915 kg (185 lb).  Procedure:  Procedure(s) (LRB): RIGHT TOTAL KNEE ARTHROPLASTY (Right)   Consults: None  HPI: Jeffery Smith is a 80 y.o. year old male with end stage OA of his right knee with progressively worsening pain and dysfunction. He has constant pain, with activity and at rest and significant functional deficits with difficulties even with ADLs. He has had extensive non-op management including analgesics, injections of cortisone and viscosupplements, and home exercise program, but remains in significant pain with significant dysfunction. Radiographs show bone on bone arthritis medial and patellofemoral. He presents now for rightt Total Knee Arthroplasty.   Laboratory Data: Admission on 09/21/2014  Component Date Value Ref Range Status  . ABO/RH(D) 09/21/2014 A POS   Final  . Antibody Screen 09/21/2014 NEG   Final  . Sample Expiration 09/21/2014 09/24/2014   Final  . ABO/RH(D) 09/21/2014 A POS   Final  . WBC 09/22/2014 13.2* 4.0 - 10.5 K/uL Final  . RBC 09/22/2014 3.43* 4.22 - 5.81 MIL/uL Final  . Hemoglobin 09/22/2014 10.7* 13.0 - 17.0 g/dL Final  . HCT 09/22/2014 32.3* 39.0 - 52.0 % Final    . MCV 09/22/2014 94.2  78.0 - 100.0 fL Final  . MCH 09/22/2014 31.2  26.0 - 34.0 pg Final  . MCHC 09/22/2014 33.1  30.0 - 36.0 g/dL Final  . RDW 09/22/2014 13.4  11.5 - 15.5 % Final  . Platelets 09/22/2014 156  150 - 400 K/uL Final  . Sodium 09/22/2014 138  137 - 147 mEq/L Final  . Potassium 09/22/2014 4.4  3.7 - 5.3 mEq/L Final  . Chloride 09/22/2014 105  96 - 112 mEq/L Final  . CO2 09/22/2014 22  19 - 32 mEq/L Final  . Glucose, Bld 09/22/2014 133* 70 - 99 mg/dL Final  . BUN 09/22/2014 20  6 - 23 mg/dL Final  . Creatinine, Ser 09/22/2014 1.12  0.50 - 1.35 mg/dL Final  . Calcium 09/22/2014 8.7  8.4 - 10.5 mg/dL Final  . GFR calc non Af Amer 09/22/2014 57* >90 mL/min Final  . GFR calc Af Amer 09/22/2014 66* >90 mL/min Final   Comment: (NOTE) The eGFR has been calculated using the CKD EPI equation. This calculation has not been validated in all clinical situations. eGFR's persistently <90 mL/min signify possible Chronic Kidney Disease.   . Anion gap 09/22/2014 11  5 - 15 Final  . WBC 09/23/2014 11.5* 4.0 - 10.5 K/uL Final  . RBC 09/23/2014 3.31* 4.22 - 5.81 MIL/uL Final  . Hemoglobin 09/23/2014 10.5* 13.0 - 17.0 g/dL Final  . HCT 09/23/2014 30.9* 39.0 - 52.0 % Final  .  MCV 09/23/2014 93.4  78.0 - 100.0 fL Final  . MCH 09/23/2014 31.7  26.0 - 34.0 pg Final  . MCHC 09/23/2014 34.0  30.0 - 36.0 g/dL Final  . RDW 09/23/2014 13.3  11.5 - 15.5 % Final  . Platelets 09/23/2014 144* 150 - 400 K/uL Final  . Sodium 09/23/2014 140  137 - 147 mEq/L Final  . Potassium 09/23/2014 4.3  3.7 - 5.3 mEq/L Final  . Chloride 09/23/2014 104  96 - 112 mEq/L Final  . CO2 09/23/2014 23  19 - 32 mEq/L Final  . Glucose, Bld 09/23/2014 112* 70 - 99 mg/dL Final  . BUN 09/23/2014 17  6 - 23 mg/dL Final  . Creatinine, Ser 09/23/2014 1.04  0.50 - 1.35 mg/dL Final  . Calcium 09/23/2014 8.8  8.4 - 10.5 mg/dL Final  . GFR calc non Af Amer 09/23/2014 62* >90 mL/min Final  . GFR calc Af Amer 09/23/2014 72* >90  mL/min Final   Comment: (NOTE) The eGFR has been calculated using the CKD EPI equation. This calculation has not been validated in all clinical situations. eGFR's persistently <90 mL/min signify possible Chronic Kidney Disease.   Georgiann Hahn gap 09/23/2014 13  5 - 15 Final  Hospital Outpatient Visit on 09/14/2014  Component Date Value Ref Range Status  . aPTT 09/14/2014 27  24 - 37 seconds Final  . WBC 09/14/2014 6.7  4.0 - 10.5 K/uL Final  . RBC 09/14/2014 4.13* 4.22 - 5.81 MIL/uL Final  . Hemoglobin 09/14/2014 12.9* 13.0 - 17.0 g/dL Final  . HCT 09/14/2014 38.7* 39.0 - 52.0 % Final  . MCV 09/14/2014 93.7  78.0 - 100.0 fL Final  . MCH 09/14/2014 31.2  26.0 - 34.0 pg Final  . MCHC 09/14/2014 33.3  30.0 - 36.0 g/dL Final  . RDW 09/14/2014 13.2  11.5 - 15.5 % Final  . Platelets 09/14/2014 180  150 - 400 K/uL Final  . Prothrombin Time 09/14/2014 13.5  11.6 - 15.2 seconds Final  . INR 09/14/2014 1.02  0.00 - 1.49 Final  . Color, Urine 09/14/2014 YELLOW  YELLOW Final  . APPearance 09/14/2014 CLEAR  CLEAR Final  . Specific Gravity, Urine 09/14/2014 1.019  1.005 - 1.030 Final  . pH 09/14/2014 5.5  5.0 - 8.0 Final  . Glucose, UA 09/14/2014 NEGATIVE  NEGATIVE mg/dL Final  . Hgb urine dipstick 09/14/2014 NEGATIVE  NEGATIVE Final  . Bilirubin Urine 09/14/2014 NEGATIVE  NEGATIVE Final  . Ketones, ur 09/14/2014 NEGATIVE  NEGATIVE mg/dL Final  . Protein, ur 09/14/2014 NEGATIVE  NEGATIVE mg/dL Final  . Urobilinogen, UA 09/14/2014 0.2  0.0 - 1.0 mg/dL Final  . Nitrite 09/14/2014 NEGATIVE  NEGATIVE Final  . Leukocytes, UA 09/14/2014 NEGATIVE  NEGATIVE Final   MICROSCOPIC NOT DONE ON URINES WITH NEGATIVE PROTEIN, BLOOD, LEUKOCYTES, NITRITE, OR GLUCOSE <1000 mg/dL.  Marland Kitchen MRSA, PCR 09/14/2014 NEGATIVE  NEGATIVE Final  . Staphylococcus aureus 09/14/2014 NEGATIVE  NEGATIVE Final   Comment:        The Xpert SA Assay (FDA approved for NASAL specimens in patients over 63 years of age), is one component  of a comprehensive surveillance program.  Test performance has been validated by [employer] for patients greater than or equal to 62 year old. It is not intended to diagnose infection nor to guide or monitor treatment.   . Sodium 09/14/2014 139  137 - 147 mEq/L Final  . Potassium 09/14/2014 4.5  3.7 - 5.3 mEq/L Final  . Chloride 09/14/2014 104  96 - 112 mEq/L Final  . CO2 09/14/2014 24  19 - 32 mEq/L Final  . Glucose, Bld 09/14/2014 101* 70 - 99 mg/dL Final  . BUN 09/14/2014 20  6 - 23 mg/dL Final  . Creatinine, Ser 09/14/2014 1.19  0.50 - 1.35 mg/dL Final  . Calcium 09/14/2014 9.7  8.4 - 10.5 mg/dL Final  . Total Protein 09/14/2014 6.8  6.0 - 8.3 g/dL Final  . Albumin 09/14/2014 3.8  3.5 - 5.2 g/dL Final  . AST 09/14/2014 18  0 - 37 U/L Final  . ALT 09/14/2014 13  0 - 53 U/L Final  . Alkaline Phosphatase 09/14/2014 63  39 - 117 U/L Final  . Total Bilirubin 09/14/2014 0.6  0.3 - 1.2 mg/dL Final  . GFR calc non Af Amer 09/14/2014 53* >90 mL/min Final  . GFR calc Af Amer 09/14/2014 61* >90 mL/min Final   Comment: (NOTE) The eGFR has been calculated using the CKD EPI equation. This calculation has not been validated in all clinical situations. eGFR's persistently <90 mL/min signify possible Chronic Kidney Disease.   . Anion gap 09/14/2014 11  5 - 15 Final     X-Rays:Dg Chest 2 View  09/14/2014   CLINICAL DATA:  Preop right knee surgery.  EXAM: CHEST  2 VIEW  COMPARISON:  10/25/2011  FINDINGS: COPD with pulmonary hyperinflation. Negative for heart failure. Negative for infiltrate or mass lesion. Lungs remain clear and unchanged from the prior study.  IMPRESSION: COPD without acute abnormality.   Electronically Signed   By: Franchot Gallo M.D.   On: 09/14/2014 11:33    EKG: Orders placed or performed during the hospital encounter of 09/21/14  . EKG 12-Lead  . EKG 12-Lead     Hospital Course: Jeffery Smith is a 80 y.o. who was admitted to [facility]. They were  brought to the operating room on 09/21/2014 and underwent Procedure(s): RIGHT TOTAL KNEE ARTHROPLASTY.  Patient tolerated the procedure well and was later transferred to the recovery room and then to the orthopaedic floor for postoperative care.  They were given PO and IV analgesics for pain control following their surgery.  They were given 24 hours of postoperative antibiotics of  Anti-infectives    Start     Dose/Rate Route Frequency Ordered Stop   09/21/14 1400  ceFAZolin (ANCEF) IVPB 2 g/50 mL premix     2 g100 mL/hr over 30 Minutes Intravenous Every 6 hours 09/21/14 1121 09/21/14 2107   09/21/14 0635  ceFAZolin (ANCEF) IVPB 2 g/50 mL premix     2 g100 mL/hr over 30 Minutes Intravenous On call to O.R. 09/21/14 8003 09/21/14 0827     and started on DVT prophylaxis in the form of Xarelto.   PT and OT were ordered for total joint protocol. Social worker got involved to assist with placement of the patient.  Discharge planning consulted to help with postop disposition and equipment needs.  Patient had a tough night on the evening of surgery with pain.  They started to get up OOB with therapy on day one. Hemovac drain was pulled without difficulty.  Continued to work with therapy into day two.  Dressing was changed on day two and the incision was healing well.  Patient was seen in rounds and was ready to go home.  Discharge to SNF Diet - Cardiac diet Follow up - in 2 weeks Activity - WBAT Disposition - Blanket Condition Upon Discharge - Good  D/C Meds - See DC Summary DVT Prophylaxis - Xarelto  Discharge Instructions    Call MD / Call 911    Complete by:  As directed   If you experience chest pain or shortness of breath, CALL 911 and be transported to the hospital emergency room.  If you develope a fever above 101 F, pus (white drainage) or increased drainage or redness at the wound, or calf pain, call your surgeon's office.     Change dressing     Complete by:  As directed   Change dressing daily with sterile 4 x 4 inch gauze dressing and apply TED hose. Do not submerge the incision under water.     Constipation Prevention    Complete by:  As directed   Drink plenty of fluids.  Prune juice may be helpful.  You may use a stool softener, such as Colace (over the counter) 100 mg twice a day.  Use MiraLax (over the counter) for constipation as needed.     Diet - low sodium heart healthy    Complete by:  As directed      Discharge instructions    Complete by:  As directed   Pick up stool softner and laxative for home use following surgery while on pain medications. Do not submerge incision under water. Please use good hand washing techniques while changing dressing each day. May shower starting three days after surgery. Please use a clean towel to pat the incision dry following showers. Continue to use ice for pain and swelling after surgery. Do not use any lotions or creams on the incision until instructed by your surgeon.  Take Xarelto for two and a half more weeks, then discontinue Xarelto. Once the patient has completed the Xarelto, they may resume the 81 mg Aspirin.  Postoperative Constipation Protocol  Constipation - defined medically as fewer than three stools per week and severe constipation as less than one stool per week.  One of the most common issues patients have following surgery is constipation.  Even if you have a regular bowel pattern at home, your normal regimen is likely to be disrupted due to multiple reasons following surgery.  Combination of anesthesia, postoperative narcotics, change in appetite and fluid intake all can affect your bowels.  In order to avoid complications following surgery, here are some recommendations in order to help you during your recovery period.  Colace (docusate) - Pick up an over-the-counter form of Colace or another stool softener and take twice a day as long as you are requiring  postoperative pain medications.  Take with a full glass of water daily.  If you experience loose stools or diarrhea, hold the colace until you stool forms back up.  If your symptoms do not get better within 1 week or if they get worse, check with your doctor.  Dulcolax (bisacodyl) - Pick up over-the-counter and take as directed by the product packaging as needed to assist with the movement of your bowels.  Take with a full glass of water.  Use this product as needed if not relieved by Colace only.   MiraLax (polyethylene glycol) - Pick up over-the-counter to have on hand.  MiraLax is a solution that will increase the amount of water in your bowels to assist with bowel movements.  Take as directed and can mix with a glass of water, juice, soda, coffee, or tea.  Take if you go more than two days without a movement. Do not use MiraLax more than once  per day. Call your doctor if you are still constipated or irregular after using this medication for 7 days in a row.  If you continue to have problems with postoperative constipation, please contact the office for further assistance and recommendations.  If you experience "the worst abdominal pain ever" or develop nausea or vomiting, please contact the office immediatly for further recommendations for treatment.       Do not put a pillow under the knee. Place it under the heel.    Complete by:  As directed      Do not sit on low chairs, stoools or toilet seats, as it may be difficult to get up from low surfaces    Complete by:  As directed      Driving restrictions    Complete by:  As directed   No driving until released by the physician.     Increase activity slowly as tolerated    Complete by:  As directed      Lifting restrictions    Complete by:  As directed   No lifting until released by the physician.     Patient may shower    Complete by:  As directed   You may shower without a dressing once there is no drainage.  Do not wash over the wound.   If drainage remains, do not shower until drainage stops.     TED hose    Complete by:  As directed   Use stockings (TED hose) for 3 weeks on both leg(s).  You may remove them at night for sleeping.     Weight bearing as tolerated    Complete by:  As directed             Medication List    STOP taking these medications        aspirin EC 81 MG tablet     ibuprofen 200 MG tablet  Commonly known as:  ADVIL,MOTRIN      TAKE these medications        acetaminophen 325 MG tablet  Commonly known as:  TYLENOL  Take 2 tablets (650 mg total) by mouth every 6 (six) hours as needed for mild pain (or Fever >/= 101).     bisacodyl 10 MG suppository  Commonly known as:  DULCOLAX  Place 1 suppository (10 mg total) rectally daily as needed for moderate constipation.     diphenhydrAMINE 25 MG tablet  Commonly known as:  BENADRYL  Take 50 mg by mouth at bedtime.     DSS 100 MG Caps  Take 100 mg by mouth 2 (two) times daily.     finasteride 5 MG tablet  Commonly known as:  PROSCAR  Take 5 mg by mouth at bedtime.     methocarbamol 500 MG tablet  Commonly known as:  ROBAXIN  Take 1 tablet (500 mg total) by mouth every 6 (six) hours as needed for muscle spasms.     metoCLOPramide 5 MG tablet  Commonly known as:  REGLAN  Take 1 tablet (5 mg total) by mouth every 8 (eight) hours as needed for nausea (if ondansetron (ZOFRAN) ineffective.).     omeprazole 20 MG capsule  Commonly known as:  PRILOSEC  Take 20 mg by mouth at bedtime.     ondansetron 4 MG tablet  Commonly known as:  ZOFRAN  Take 1 tablet (4 mg total) by mouth every 6 (six) hours as needed for nausea.     oxyCODONE 5 MG immediate  release tablet  Commonly known as:  Oxy IR/ROXICODONE  Take 1-2 tablets (5-10 mg total) by mouth every 3 (three) hours as needed for moderate pain, severe pain or breakthrough pain.     polyethylene glycol packet  Commonly known as:  MIRALAX / GLYCOLAX  Take 17 g by mouth daily as needed for  mild constipation.     rivaroxaban 10 MG Tabs tablet  Commonly known as:  XARELTO  - Take 1 tablet (10 mg total) by mouth daily with breakfast. Take Xarelto for two and a half more weeks, then discontinue Xarelto.  - Once the patient has completed the Xarelto, they may resume the 81 mg Aspirin.     simvastatin 80 MG tablet  Commonly known as:  ZOCOR  Take 40 mg by mouth at bedtime.     traMADol 50 MG tablet  Commonly known as:  ULTRAM  Take 1-2 tablets (50-100 mg total) by mouth every 6 (six) hours as needed (mild pain).           Follow-up Information    Follow up with Gearlean Alf, MD. Schedule an appointment as soon as possible for a visit in 2 weeks.   Specialty:  Orthopedic Surgery   Why:  Call office at [phone] to set up appointment the last week of December with Dr. Wynelle Link.   Contact information:   [address] [place] [address] [phone]       Signed: Arlee Muslim, PA-C Orthopaedic Surgery 09/23/2014, 10:19 AM

## 2015-10-05 ENCOUNTER — Emergency Department (HOSPITAL_COMMUNITY): Payer: Medicare PPO

## 2015-10-05 ENCOUNTER — Emergency Department (HOSPITAL_COMMUNITY)
Admission: EM | Admit: 2015-10-05 | Discharge: 2015-10-05 | Disposition: A | Discharge status: Home / Self Care | Payer: Medicare PPO | Attending: Emergency Medicine | Admitting: Emergency Medicine

## 2015-10-05 ENCOUNTER — Encounter (HOSPITAL_COMMUNITY): Payer: Self-pay | Admitting: *Deleted

## 2015-10-05 DIAGNOSIS — H9190 Unspecified hearing loss, unspecified ear: Secondary | ICD-10-CM | POA: Insufficient documentation

## 2015-10-05 DIAGNOSIS — E785 Hyperlipidemia, unspecified: Secondary | ICD-10-CM | POA: Diagnosis not present

## 2015-10-05 DIAGNOSIS — Z8669 Personal history of other diseases of the nervous system and sense organs: Secondary | ICD-10-CM | POA: Diagnosis not present

## 2015-10-05 DIAGNOSIS — Z87438 Personal history of other diseases of male genital organs: Secondary | ICD-10-CM | POA: Diagnosis not present

## 2015-10-05 DIAGNOSIS — S52512A Displaced fracture of left radial styloid process, initial encounter for closed fracture: Secondary | ICD-10-CM

## 2015-10-05 DIAGNOSIS — S52515A Nondisplaced fracture of left radial styloid process, initial encounter for closed fracture: Secondary | ICD-10-CM | POA: Diagnosis not present

## 2015-10-05 DIAGNOSIS — W01198A Fall on same level from slipping, tripping and stumbling with subsequent striking against other object, initial encounter: Secondary | ICD-10-CM | POA: Diagnosis not present

## 2015-10-05 DIAGNOSIS — Y92197 Garden or yard of other specified residential institution as the place of occurrence of the external cause: Secondary | ICD-10-CM | POA: Insufficient documentation

## 2015-10-05 DIAGNOSIS — Z8709 Personal history of other diseases of the respiratory system: Secondary | ICD-10-CM | POA: Diagnosis not present

## 2015-10-05 DIAGNOSIS — Y9389 Activity, other specified: Secondary | ICD-10-CM | POA: Insufficient documentation

## 2015-10-05 DIAGNOSIS — Z8679 Personal history of other diseases of the circulatory system: Secondary | ICD-10-CM | POA: Insufficient documentation

## 2015-10-05 DIAGNOSIS — Y998 Other external cause status: Secondary | ICD-10-CM | POA: Insufficient documentation

## 2015-10-05 DIAGNOSIS — K219 Gastro-esophageal reflux disease without esophagitis: Secondary | ICD-10-CM | POA: Diagnosis not present

## 2015-10-05 DIAGNOSIS — Z7901 Long term (current) use of anticoagulants: Secondary | ICD-10-CM | POA: Insufficient documentation

## 2015-10-05 DIAGNOSIS — W19XXXA Unspecified fall, initial encounter: Secondary | ICD-10-CM

## 2015-10-05 DIAGNOSIS — S6992XA Unspecified injury of left wrist, hand and finger(s), initial encounter: Secondary | ICD-10-CM | POA: Diagnosis present

## 2015-10-05 LAB — BASIC METABOLIC PANEL
Anion gap: 7 (ref 5–15)
BUN: 24 mg/dL — AB (ref 6–20)
CHLORIDE: 105 mmol/L (ref 101–111)
CO2: 26 mmol/L (ref 22–32)
CREATININE: 1.24 mg/dL (ref 0.61–1.24)
Calcium: 9.3 mg/dL (ref 8.9–10.3)
GFR calc Af Amer: 58 mL/min — ABNORMAL LOW (ref >60)
GFR calc non Af Amer: 50 mL/min — ABNORMAL LOW (ref >60)
Glucose, Bld: 132 mg/dL — ABNORMAL HIGH (ref 65–99)
Potassium: 4.1 mmol/L (ref 3.5–5.1)
Sodium: 138 mmol/L (ref 135–145)

## 2015-10-05 LAB — CBC WITH DIFFERENTIAL/PLATELET
BASOS ABS: 0 10*3/uL (ref 0.0–0.1)
BASOS PCT: 0 %
EOS PCT: 1 %
Eosinophils Absolute: 0.1 10*3/uL (ref 0.0–0.7)
HCT: 38.2 % — ABNORMAL LOW (ref 39.0–52.0)
Hemoglobin: 12.9 g/dL — ABNORMAL LOW (ref 13.0–17.0)
LYMPHS ABS: 1 10*3/uL (ref 0.7–4.0)
Lymphocytes Relative: 10 %
MCH: 31.6 pg (ref 26.0–34.0)
MCHC: 33.8 g/dL (ref 30.0–36.0)
MCV: 93.6 fL (ref 78.0–100.0)
MONO ABS: 1 10*3/uL (ref 0.1–1.0)
MONOS PCT: 9 %
NEUTROS PCT: 80 %
Neutro Abs: 8.3 10*3/uL — ABNORMAL HIGH (ref 1.7–7.7)
PLATELETS: 168 10*3/uL (ref 150–400)
RBC: 4.08 MIL/uL — ABNORMAL LOW (ref 4.22–5.81)
RDW: 13.3 % (ref 11.5–15.5)
WBC: 10.4 10*3/uL (ref 4.0–10.5)

## 2015-10-05 LAB — TROPONIN I

## 2015-10-05 MED ORDER — OXYCODONE-ACETAMINOPHEN 5-325 MG PO TABS: 1.0000 | ORAL_TABLET | ORAL | Status: DC | PRN

## 2015-10-05 MED ORDER — MORPHINE SULFATE (PF) 2 MG/ML IV SOLN
2.0000 mg | Freq: Once | INTRAVENOUS | Status: AC
  Administered 2015-10-05: 2 mg via INTRAVENOUS
  Filled 2015-10-05: qty 1

## 2015-10-05 MED ORDER — DOCUSATE SODIUM 100 MG PO CAPS: 100.0000 mg | ORAL_CAPSULE | Freq: Two times a day (BID) | ORAL | Status: DC

## 2015-10-05 MED ORDER — HYDROMORPHONE HCL 1 MG/ML IJ SOLN
0.5000 mg | Freq: Once | INTRAMUSCULAR | Status: AC
  Administered 2015-10-05: 0.5 mg via INTRAVENOUS
  Filled 2015-10-05: qty 1

## 2015-10-05 MED ORDER — MORPHINE SULFATE (PF) 4 MG/ML IV SOLN
4.0000 mg | Freq: Once | INTRAVENOUS | Status: AC
  Administered 2015-10-05: 4 mg via INTRAVENOUS
  Filled 2015-10-05: qty 1

## 2015-10-05 NOTE — ED Provider Notes (Signed)
CSN: [id number]     Arrival date & time 10/05/15  4098 History   First MD Initiated Contact with Patient 10/05/15 0933     No chief complaint on file.    (Consider location/radiation/quality/duration/timing/severity/associated sxs/prior Treatment) HPI   Jeffery Smith is a 81 y.o. male who presents to the Emergency Department complaining of left wrist pain and swelling after a fall. He presents with his daughter who states he fell at his house outside in the yard.  He states that he may have struck his head on the ground and his unsure of LOC.  He denies any pain except the wrist, headache, dizziness, vomiting, visual changes, neck or back pain. He also denies taking blood thinners.     Past Medical History  Diagnosis Date  . Arthritis   . Sore throat   . Hyperlipidemia   . Benign head tremor   . Enlarged prostate   . GERD (gastroesophageal reflux disease)   . Dysrhythmia     irregular heart beat per patient- skips a beat   . Carotid artery occlusion     right side   . Hard of hearing    Past Surgical History  Procedure Laterality Date  . Total knee arthroplasty      left  . Cataract extraction      left  . Tonsillectomy    . Endarterectomy  11/03/2011    Procedure: ENDARTERECTOMY CAROTID;  Surgeon: Larina Earthly, MD;  Location: [facility] OR;  Service: Vascular;  Laterality: Right;  Right Carotid endarterectomy with patch angiopolasty  . Total knee arthroplasty Right 09/21/2014    Procedure: RIGHT TOTAL KNEE ARTHROPLASTY;  Surgeon: Loanne Drilling, MD;  Location: [facility] ORS;  Service: Orthopedics;  Laterality: Right;   Family History  Problem Relation Age of Onset  . Cancer Mother   . Cancer Brother    Social History  Substance Use Topics  . Smoking status: Never Smoker   . Smokeless tobacco: Never Used  . Alcohol Use: Yes     Comment: 1 glass of wine daily    Review of Systems  Constitutional: Negative for fever and chills.  HENT: Negative for trouble swallowing.    Eyes: Negative for visual disturbance.  Gastrointestinal: Negative for nausea, vomiting and abdominal pain.  Genitourinary: Negative for dysuria, hematuria and difficulty urinating.  Musculoskeletal: Positive for joint swelling and arthralgias (left wrist pain and swelling).  Skin: Negative for color change and wound.  Neurological: Negative for dizziness, seizures, syncope, weakness, numbness and headaches.  Psychiatric/Behavioral: Negative for confusion.  All other systems reviewed and are negative.     Allergies  Horse-derived products  Home Medications   Prior to Admission medications   Medication Sig Start Date End Date Taking? Authorizing Provider  acetaminophen (TYLENOL) 325 MG tablet Take 2 tablets (650 mg total) by mouth every 6 (six) hours as needed for mild pain (or Fever >/= 101). 09/23/14   Avel Peace, PA-C  bisacodyl (DULCOLAX) 10 MG suppository Place 1 suppository (10 mg total) rectally daily as needed for moderate constipation. 09/23/14   Avel Peace, PA-C  diphenhydrAMINE (BENADRYL) 25 MG tablet Take 50 mg by mouth at bedtime.    Historical Provider, MD  docusate sodium 100 MG CAPS Take 100 mg by mouth 2 (two) times daily. 09/23/14   Avel Peace, PA-C  finasteride (PROSCAR) 5 MG tablet Take 5 mg by mouth at bedtime.     Historical Provider, MD  methocarbamol (ROBAXIN) 500 MG tablet Take 1 tablet (  500 mg total) by mouth every 6 (six) hours as needed for muscle spasms. 09/23/14   Avel Peace, PA-C  metoCLOPramide (REGLAN) 5 MG tablet Take 1 tablet (5 mg total) by mouth every 8 (eight) hours as needed for nausea (if ondansetron (ZOFRAN) ineffective.). 09/23/14   Avel Peace, PA-C  omeprazole (PRILOSEC) 20 MG capsule Take 20 mg by mouth at bedtime.     Historical Provider, MD  ondansetron (ZOFRAN) 4 MG tablet Take 1 tablet (4 mg total) by mouth every 6 (six) hours as needed for nausea. 09/23/14   Avel Peace, PA-C  oxyCODONE (OXY IR/ROXICODONE) 5 MG immediate release  tablet Take 1-2 tablets (5-10 mg total) by mouth every 3 (three) hours as needed for moderate pain, severe pain or breakthrough pain. 09/23/14   Avel Peace, PA-C  polyethylene glycol [facility] / GLYCOLAX) packet Take 17 g by mouth daily as needed for mild constipation. 09/23/14   Avel Peace, PA-C  rivaroxaban (XARELTO) 10 MG TABS tablet Take 1 tablet (10 mg total) by mouth daily with breakfast. Take Xarelto for two and a half more weeks, then discontinue Xarelto. Once the patient has completed the Xarelto, they may resume the 81 mg Aspirin. 09/23/14   Avel Peace, PA-C  simvastatin (ZOCOR) 80 MG tablet Take 40 mg by mouth at bedtime.     Historical Provider, MD  traMADol (ULTRAM) 50 MG tablet Take 1-2 tablets (50-100 mg total) by mouth every 6 (six) hours as needed (mild pain). 09/23/14   Avel Peace, PA-C   BP 179/89 mmHg  Pulse 96  Temp(Src) 98 F (36.7 C) (Oral)  Resp 20  Ht 6\' 1"  (1.854 m)  Wt 81.647 kg  BMI 23.75 kg/m2  SpO2 97% Physical Exam  Constitutional: He is oriented to person, place, and time. He appears well-developed and well-nourished. No distress.  HENT:  Head: Normocephalic and atraumatic.  Mouth/Throat: Oropharynx is clear and moist.  Neck: Normal range of motion.  Cardiovascular: Normal rate, regular rhythm, normal heart sounds and intact distal pulses.   Pulmonary/Chest: Effort normal and breath sounds normal. No respiratory distress. He exhibits no tenderness.  Abdominal: Soft. He exhibits no distension. There is no tenderness. There is no rebound and no guarding.  Musculoskeletal: He exhibits tenderness. He exhibits no edema.  ttp and moderate edema of the left wrist.  Radial pulse is brisk, distal sensation intact.  CR< 2 sec.  No bruising or bony deformity.  limited ROM of the wrist due to pain.  Compartments are soft.  Neurological: He is alert and oriented to person, place, and time. He exhibits normal muscle tone. Coordination normal.  Skin: Skin is warm  and dry.  Nursing note and vitals reviewed.   ED Course  Procedures (including critical care time) Labs Review Labs Reviewed  BASIC METABOLIC PANEL - Abnormal; Notable for the following:    Glucose, Bld 132 (*)    BUN 24 (*)    GFR calc non Af Amer 50 (*)    GFR calc Af Amer 58 (*)    All other components within normal limits  CBC WITH DIFFERENTIAL/PLATELET - Abnormal; Notable for the following:    RBC 4.08 (*)    Hemoglobin 12.9 (*)    HCT 38.2 (*)    Neutro Abs 8.3 (*)    All other components within normal limits  TROPONIN I    Imaging Review Dg Chest 2 View  10/05/2015  CLINICAL DATA:  Pain following fall; patient tripped over dog EXAM: CHEST  2 VIEW COMPARISON:  September 14, 2014 FINDINGS: There is no edema or consolidation. Heart is upper normal in size with pulmonary vascularity within normal limits. No adenopathy. No pneumothorax. There is degenerative change in the thoracic spine. No acute fracture evident. IMPRESSION: No edema or consolidation. Electronically Signed   By: Bretta BangWilliam  Woodruff III M.D.   On: 10/05/2015 12:26   Dg Wrist Complete Left  10/05/2015  CLINICAL DATA:  81 year old male with left wrist pain after tripping and falling over the dog last night EXAM: LEFT WRIST - COMPLETE 3+ VIEW COMPARISON:  None. FINDINGS: There is no evidence of fracture or dislocation. There is no evidence of arthropathy or other focal bone abnormality. Soft tissues are unremarkable. Mild thumb CMC joint osteoarthritis. IMPRESSION: 1. No evidence of acute fracture or malalignment. 2. Thumb CMC joint osteoarthritis. Electronically Signed   By: Malachy MoanHeath  McCullough M.D.   On: 10/05/2015 10:52   Ct Head Wo Contrast  10/05/2015  CLINICAL DATA:  81 year old male status post fall from tripping over dog EXAM: CT HEAD WITHOUT CONTRAST CT CERVICAL SPINE WITHOUT CONTRAST TECHNIQUE: Multidetector CT imaging of the head and cervical spine was performed following the standard protocol without  intravenous contrast. Multiplanar CT image reconstructions of the cervical spine were also generated. COMPARISON:  None. FINDINGS: CT HEAD FINDINGS Negative for acute intracranial hemorrhage, acute infarction, mass, mass effect, hydrocephalus or midline shift. Gray-white differentiation is preserved throughout. Cerebral cortical atrophy relatively mild for age. Minimal chronic microvascular ischemic white matter disease as evidenced by periventricular decreased attenuation. No focal scalp hematoma or contusion. Globes and orbits are symmetric bilaterally. No focal calvarial abnormality. Normal aeration mastoid air cells and visualized paranasal sinuses. CT CERVICAL SPINE FINDINGS No acute fracture, malalignment or prevertebral soft tissue swelling. Multilevel cervical spondylosis. Posterior disc osteophyte complex at C5-C6 and C4-C5 resulting in at least moderate central stenosis. Unremarkable CT appearance of the thyroid gland. No acute soft tissue abnormality. The lung apices are unremarkable. IMPRESSION: CT HEAD 1. No acute intracranial abnormality. 2. Mild atrophy and chronic microvascular ischemic white matter disease. CT CSPINE 1. No acute fracture or malalignment. 2. Multilevel degenerative cervical spondylosis most severe at C5-C6 were there is at least moderate central stenosis. Electronically Signed   By: Malachy MoanHeath  McCullough M.D.   On: 10/05/2015 11:05   Ct Cervical Spine Wo Contrast  10/05/2015  CLINICAL DATA:  81 year old male status post fall from tripping over dog EXAM: CT HEAD WITHOUT CONTRAST CT CERVICAL SPINE WITHOUT CONTRAST TECHNIQUE: Multidetector CT imaging of the head and cervical spine was performed following the standard protocol without intravenous contrast. Multiplanar CT image reconstructions of the cervical spine were also generated. COMPARISON:  None. FINDINGS: CT HEAD FINDINGS Negative for acute intracranial hemorrhage, acute infarction, mass, mass effect, hydrocephalus or midline  shift. Gray-white differentiation is preserved throughout. Cerebral cortical atrophy relatively mild for age. Minimal chronic microvascular ischemic white matter disease as evidenced by periventricular decreased attenuation. No focal scalp hematoma or contusion. Globes and orbits are symmetric bilaterally. No focal calvarial abnormality. Normal aeration mastoid air cells and visualized paranasal sinuses. CT CERVICAL SPINE FINDINGS No acute fracture, malalignment or prevertebral soft tissue swelling. Multilevel cervical spondylosis. Posterior disc osteophyte complex at C5-C6 and C4-C5 resulting in at least moderate central stenosis. Unremarkable CT appearance of the thyroid gland. No acute soft tissue abnormality. The lung apices are unremarkable. IMPRESSION: CT HEAD 1. No acute intracranial abnormality. 2. Mild atrophy and chronic microvascular ischemic white matter disease. CT CSPINE 1. No acute fracture  or malalignment. 2. Multilevel degenerative cervical spondylosis most severe at C5-C6 were there is at least moderate central stenosis. Electronically Signed   By: Malachy Moan M.D.   On: 10/05/2015 11:05   Ct Wrist Left Wo Contrast  10/05/2015  CLINICAL DATA:  Tripped over dog.  Left wrist pain. EXAM: CT OF THE LEFT WRIST WITHOUT CONTRAST TECHNIQUE: Multidetector CT imaging was performed according to the standard protocol. Multiplanar CT image reconstructions were also generated. COMPARISON:  10/05/2015 radiographs FINDINGS: Acute nondisplaced Chauffeur fracture of the radial styloid. Considerable osteoarthritis of the first carpometacarpal articulation. Chondrocalcinosis of the scapholunate and lunatotriquetral ligaments along with suspected faint synovial calcification in the rest in scattered locations. I do not see an ulnar styloid fracture.  No scapholunate widening. Scattered geodes are present in the carpus, most notably distally in the a capitate. At the level of the ulnar styloid, the extensor  carpi ulnaris tendon is slightly more laterally located than I would expect. This could indicate an ECU subsheath injury. IMPRESSION: 1. Acute nondisplaced Chauffeur fracture of the radial styloid. 2. Chondrocalcinosis, with faint synovial calcification scattered in the wrist. 3. Anterior positioning of the ECU tendon at the level of the ulnar styloid, possibly indicating an ECU subsheath tear. 4. Degenerative findings at the first carpometacarpal articulation. Electronically Signed   By: Gaylyn Rong M.D.   On: 10/05/2015 13:14   Dg Hand Complete Left  10/05/2015  CLINICAL DATA:  Pain following fall after tripping over dog EXAM: LEFT HAND - COMPLETE 3+ VIEW COMPARISON:  None. FINDINGS: Frontal, oblique, and lateral views were obtained. There is soft tissue swelling diffusely. There is no demonstrable fracture or dislocation. There is osteoarthritic change in all PIP and DIP joints as well as in the first, second, and third MCP joints. There is also osteoarthritic change in the first carpal -metacarpal joint and scaphotrapezial joints. No erosive change. There is intra-articular calcification at several sites consistent with arthropathic change. IMPRESSION: Multilevel osteoarthritic change. No acute fracture or dislocation. Soft tissue swelling present. Electronically Signed   By: Bretta Bang III M.D.   On: 10/05/2015 10:54    I have personally reviewed and evaluated these images and lab results as part of my medical decision-making.   EKG Interpretation   Date/Time:  Tuesday October 05 2015 11:29:11 EST Ventricular Rate:  82 PR Interval:  185 QRS Duration: 141 QT Interval:  404 QTC Calculation: 472 R Axis:   -67 Text Interpretation:  Sinus rhythm RBBB and LAFB Probable left ventricular  hypertrophy No significant change was found Confirmed by Manus Gunning  MD,  STEPHEN [phone]) on 10/05/2015 11:50:51 AM      MDM   Final diagnoses:  Fall  Radial styloid fracture, left, closed,  initial encounter   Pain uncontrolled after morphine.  Better control after dilaudid.    Sugar tong splint applied.  Pain improved, remains NV intact. Ambulates in the dept with steady gait.     Pt also evaluated by Dr. Manus Gunning.       Pauline Aus, PA-C 10/09/15 2054  Glynn Octave, MD 10/10/15 [phone]

## 2015-10-05 NOTE — Discharge Instructions (Signed)
Wrist Fracture °A wrist fracture is a break or crack in one of the bones of your wrist. Your wrist is made up of eight small bones at the palm of your hand (carpal bones) and two long bones that make up your forearm (radius and ulna). The goal of treatment is to hold the injured bone in place while it heals. Surgery may or may not be needed to care for your injured wrist.  °HOME CARE °· Keep your injured wrist raised (elevated). Move your fingers as much as you can. °· Do not put pressure on any part of your cast or splint. It may break. °· Use a plastic bag to protect your cast or splint from water while bathing or showering. Do not lower your cast or splint into water. °· Take medicines only as told by your doctor. °· Keep your cast or splint clean and dry. If it gets wet, damaged, or suddenly feels too tight, tell your doctor right away. °· Do not use any tobacco products including cigarettes, chewing tobacco, or electronic cigarettes. Tobacco can slow bone healing. If you need help quitting, ask your doctor. °· Keep all follow-up visits as told by your doctor. This is important. °· Ask your doctor if you should take supplements of calcium and vitamins C and D. °GET HELP IF:  °· Your cast or splint is damaged, breaks, or gets wet. °· You have a fever. °· You have chills. °· You have very bad pain that does not go away. °· You have more swelling (inflammation) than before the cast was put on. °GET HELP RIGHT AWAY IF:  °· Your hand or fingernails on the injured arm turn blue or gray, or feel cold or numb. °· You lose some feeling in the fingers of your injured arm. °MAKE SURE YOU:  °· Understand these instructions. °· Will watch your condition. °· Will get help right away if you are not doing well or get worse. °  °This information is not intended to replace advice given to you by your health care provider. Make sure you discuss any questions you have with your health care provider. °  °Document Released:  03/13/2008 Document Revised: 10/16/2014 Document Reviewed: 04/08/2012 °Elsevier Interactive Patient Education ©2016 [employer] ° °

## 2015-10-05 NOTE — ED Provider Notes (Signed)
Medical screening examination/treatment/procedure(s) were conducted as a shared visit with non-physician practitioner(s) and myself.  I personally evaluated the patient during the encounter.   EKG Interpretation   Date/Time:  Tuesday October 05 2015 11:29:11 EST Ventricular Rate:  82 PR Interval:  185 QRS Duration: 141 QT Interval:  404 QTC Calculation: 472 R Axis:   -67 Text Interpretation:  Sinus rhythm RBBB and LAFB Probable left ventricular  hypertrophy No significant change was found Confirmed by Manus GunningANCOUR  MD,  Basil Blakesley [phone]([id number]) on 10/05/2015 11:50:51 AM        By signing my name below, I, Marica OtterNusrat Rahman, attest that this documentation has been prepared under the direction and in the presence of Glynn OctaveStephen Chrishaun Sasso, MD. Electronically Signed: Marica OtterNusrat Rahman, ED Scribe. 10/05/2015. 10:10 AM.  HPI Comments: Juliann PulseFrederick M Ottaway is a 81 y.o. male, with PMHx of irregular heartbeat who presents to the Emergency Department complaining of left wrist pain onset yesterday at 4PM after pt tripped over his dog, fell forwards and tried to catch himself with his left arm. Associated Sx include 10/10, aching left wrist pain. Pt reports he may have lost consciousness for 5-4610min following the fall, however, is unsure. Pt confirms head trauma resulting from the fall. Pt reports taking oxycodone 3 hours ago for his left wrist pain. Pt denies abd pain, chest pain, neck pain, back pain, left elbow pain, left shoulder pain, decreased ROM of left elbow. Pt denies any current blood thinner use.   PE:  No c-spine tenderness  Tenderness and swelling to left radial wrist, intact radial pulse, decreased ROM secondary to pain FROM L shoulder and elbow  CT head, Xrays.  Neurovascularly intact. No longer on xarelto.    I personally performed the services described in this documentation, which was scribed in my presence. The recorded information has been reviewed and is accurate.    Glynn OctaveStephen Trishna Cwik,  MD 10/05/15 [phone]

## 2015-10-05 NOTE — ED Notes (Addendum)
Pt reports tripping over dog last night and fell, c/o pain to left wrist. Pt states he did hit head on ground and unsure of LOC.  Left radial pulse strong, sensation intact, cap refill WNL.

## 2015-10-05 NOTE — ED Notes (Signed)
Pt has brisk capillary refills in splinted arm

## 2015-10-05 NOTE — ED Notes (Signed)
Pt reports that there has been no change in pain after morphine and wrist splint. PA notified.

## 2015-10-07 ENCOUNTER — Ambulatory Visit (INDEPENDENT_AMBULATORY_CARE_PROVIDER_SITE_OTHER): Payer: Medicare PPO | Admitting: Orthopedic Surgery

## 2015-10-07 VITALS — BP 118/67 | Ht 73.0 in | Wt 180.0 lb

## 2015-10-07 DIAGNOSIS — S52512A Displaced fracture of left radial styloid process, initial encounter for closed fracture: Secondary | ICD-10-CM

## 2015-10-07 MED ORDER — HYDROCODONE-ACETAMINOPHEN 5-325 MG PO TABS: 1.0000 | ORAL_TABLET | Freq: Four times a day (QID) | ORAL | Status: DC | PRN

## 2015-10-07 NOTE — Progress Notes (Signed)
New Fracture   Chief Complaint  Patient presents with  . Wrist Injury    er follow up left wrist fracture, DOI 10/04/15    This 81 year old gentleman fell caring for his dog. Complains of left wrist pain secondary to left wrist show first type fracture diagnosed by x-ray and CAT scan  Location of pain left wrist radial styloid. Quality dull ache. Severity mild. Duration 3 days. Timing constant. Context fall. Modifying factors motion. Associated signs and symptoms stiffness of the fingers  He was splinted in the ER  He has no neurologic symptoms and no constitutional symptoms  Past Medical History  Diagnosis Date  . Arthritis   . Sore throat   . Hyperlipidemia   . Benign head tremor   . Enlarged prostate   . GERD (gastroesophageal reflux disease)   . Dysrhythmia     irregular heart beat per patient- skips a beat   . Carotid artery occlusion     right side   . Hard of hearing     BP 118/67 mmHg  Ht 6\' 1"  (1.854 m)  Wt 180 lb (81.647 kg)  BMI 23.75 kg/m2  He is well-developed well-nourished grooming and hygiene are normal  He has tenderness at the radial styloid of the left wrist with a nontender shoulder and elbow. He has decreased range of motion of all digits of his hand and also the wrist. No atrophy in the hand forearm or upper arm. Shoulder elbow wrist stable. Sensory-wise he has good soft touch and position sense. Pulses are good color is normal.  His mood is normal is oriented to person and place and time.  Epitrochlear lymph nodes are negative axillary lymph nodes are negative  My interpretation of the x-rays that he has a radial styloid fracture which is seen on the CT scan which I also reviewed red and independently interpreted as radial styloid fracture  Diagnosis radial styloid fracture  Plan splint 6 weeks and x-ray left wrist.

## 2015-11-01 ENCOUNTER — Encounter: Payer: Self-pay | Admitting: *Deleted

## 2015-11-18 ENCOUNTER — Ambulatory Visit: Payer: Medicare PPO | Admitting: Orthopedic Surgery

## 2015-11-22 ENCOUNTER — Ambulatory Visit: Payer: Medicare PPO | Admitting: Orthopedic Surgery

## 2015-11-24 ENCOUNTER — Ambulatory Visit (INDEPENDENT_AMBULATORY_CARE_PROVIDER_SITE_OTHER): Payer: Self-pay | Admitting: Orthopedic Surgery

## 2015-11-24 ENCOUNTER — Ambulatory Visit (INDEPENDENT_AMBULATORY_CARE_PROVIDER_SITE_OTHER): Payer: Medicare PPO

## 2015-11-24 ENCOUNTER — Encounter: Payer: Self-pay | Admitting: Orthopedic Surgery

## 2015-11-24 VITALS — BP 143/75 | HR 77 | Ht 73.0 in | Wt 189.6 lb

## 2015-11-24 DIAGNOSIS — S62102D Fracture of unspecified carpal bone, left wrist, subsequent encounter for fracture with routine healing: Secondary | ICD-10-CM | POA: Diagnosis not present

## 2015-11-24 DIAGNOSIS — S52512D Displaced fracture of left radial styloid process, subsequent encounter for closed fracture with routine healing: Secondary | ICD-10-CM

## 2015-11-24 NOTE — Progress Notes (Signed)
Patient ID: Jeffery Smith, male   DOB: 18-Nov-1924, 82 y.o.   MRN: [id number]  Chief Complaint  Patient presents with  . Follow-up    6 week follow up left wrist fracture DOI 10/04/15    BP 143/75 mmHg  Pulse 77  Ht  (1.854 m)  Wt 189 lb 9.6 oz (86.002 kg)  BMI 25.02 kg/m2  Follow-up fracture left wrist best seen on CT scan repeat x-ray shows fracture healing patient has no complaints. Clinical exam is normal  AP lateral and oblique left wrist fracture left wrist  Osteoarthritis is seen at the [facility] joint. Minimal degenerative change at the radiocarpal joint. Fracture has healed and is not visible. Overall alignment normal.  Impression healed fracture left distal radius with CMC joint arthritis.     ASSESSMENT AND PLAN   Released from care

## 2016-11-10 ENCOUNTER — Encounter: Payer: Self-pay | Admitting: Internal Medicine

## 2017-11-08 ENCOUNTER — Emergency Department (HOSPITAL_COMMUNITY)
Admission: EM | Admit: 2017-11-08 | Discharge: 2017-11-08 | Disposition: A | Discharge status: Home / Self Care | Payer: Medicare Other | Attending: Emergency Medicine | Admitting: Emergency Medicine

## 2017-11-08 ENCOUNTER — Emergency Department (HOSPITAL_COMMUNITY): Payer: Medicare Other

## 2017-11-08 ENCOUNTER — Encounter (HOSPITAL_COMMUNITY): Payer: Self-pay

## 2017-11-08 DIAGNOSIS — Z79899 Other long term (current) drug therapy: Secondary | ICD-10-CM | POA: Diagnosis not present

## 2017-11-08 DIAGNOSIS — Y9389 Activity, other specified: Secondary | ICD-10-CM | POA: Diagnosis not present

## 2017-11-08 DIAGNOSIS — S99912A Unspecified injury of left ankle, initial encounter: Secondary | ICD-10-CM | POA: Diagnosis present

## 2017-11-08 DIAGNOSIS — Y92838 Other recreation area as the place of occurrence of the external cause: Secondary | ICD-10-CM | POA: Diagnosis not present

## 2017-11-08 DIAGNOSIS — Z96651 Presence of right artificial knee joint: Secondary | ICD-10-CM | POA: Insufficient documentation

## 2017-11-08 DIAGNOSIS — S93402A Sprain of unspecified ligament of left ankle, initial encounter: Secondary | ICD-10-CM

## 2017-11-08 DIAGNOSIS — W231XXA Caught, crushed, jammed, or pinched between stationary objects, initial encounter: Secondary | ICD-10-CM | POA: Insufficient documentation

## 2017-11-08 DIAGNOSIS — Y998 Other external cause status: Secondary | ICD-10-CM | POA: Insufficient documentation

## 2017-11-08 MED ORDER — TRAMADOL HCL 50 MG PO TABS
50.0000 mg | ORAL_TABLET | Freq: Once | ORAL | Status: AC
  Administered 2017-11-08: 50 mg via ORAL
  Filled 2017-11-08: qty 1
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
## 2017-11-08 NOTE — Discharge Instructions (Signed)
Use Tylenol or ibuprofen as needed for pain.  You may put weight on the ankle as tolerated.  Follow-up with Dr. Romeo AppleHarrison.  Return to the ED if you develop new or worsening symptoms.

## 2017-11-12 ENCOUNTER — Ambulatory Visit: Payer: Medicare Other | Admitting: Orthopedic Surgery

## 2017-12-13 ENCOUNTER — Ambulatory Visit (INDEPENDENT_AMBULATORY_CARE_PROVIDER_SITE_OTHER): Payer: Medicare Other | Admitting: Otolaryngology

## 2017-12-13 DIAGNOSIS — J310 Chronic rhinitis: Secondary | ICD-10-CM | POA: Diagnosis not present

## 2017-12-13 DIAGNOSIS — J343 Hypertrophy of nasal turbinates: Secondary | ICD-10-CM | POA: Diagnosis not present

## 2018-01-28 ENCOUNTER — Ambulatory Visit (INDEPENDENT_AMBULATORY_CARE_PROVIDER_SITE_OTHER): Payer: Medicare Other | Admitting: Otolaryngology

## 2018-01-28 DIAGNOSIS — J342 Deviated nasal septum: Secondary | ICD-10-CM

## 2018-01-28 DIAGNOSIS — J310 Chronic rhinitis: Secondary | ICD-10-CM

## 2018-01-28 DIAGNOSIS — J343 Hypertrophy of nasal turbinates: Secondary | ICD-10-CM | POA: Diagnosis not present

## 2018-05-13 ENCOUNTER — Other Ambulatory Visit: Payer: Self-pay

## 2018-05-13 ENCOUNTER — Emergency Department (HOSPITAL_COMMUNITY)
Admission: EM | Admit: 2018-05-13 | Discharge: 2018-05-13 | Disposition: A | Discharge status: Home / Self Care | Payer: Medicare Other | Attending: Emergency Medicine | Admitting: Emergency Medicine

## 2018-05-13 ENCOUNTER — Encounter (HOSPITAL_COMMUNITY): Payer: Self-pay | Admitting: Emergency Medicine

## 2018-05-13 DIAGNOSIS — S0502XA Injury of conjunctiva and corneal abrasion without foreign body, left eye, initial encounter: Secondary | ICD-10-CM

## 2018-05-13 DIAGNOSIS — Y929 Unspecified place or not applicable: Secondary | ICD-10-CM | POA: Diagnosis not present

## 2018-05-13 DIAGNOSIS — Z79899 Other long term (current) drug therapy: Secondary | ICD-10-CM | POA: Insufficient documentation

## 2018-05-13 DIAGNOSIS — Y93H9 Activity, other involving exterior property and land maintenance, building and construction: Secondary | ICD-10-CM | POA: Diagnosis not present

## 2018-05-13 DIAGNOSIS — Y999 Unspecified external cause status: Secondary | ICD-10-CM | POA: Diagnosis not present

## 2018-05-13 DIAGNOSIS — Z96653 Presence of artificial knee joint, bilateral: Secondary | ICD-10-CM | POA: Diagnosis not present

## 2018-05-13 DIAGNOSIS — W228XXA Striking against or struck by other objects, initial encounter: Secondary | ICD-10-CM | POA: Insufficient documentation

## 2018-05-13 DIAGNOSIS — S0592XA Unspecified injury of left eye and orbit, initial encounter: Secondary | ICD-10-CM | POA: Diagnosis present

## 2018-05-13 DIAGNOSIS — S0081XA Abrasion of other part of head, initial encounter: Secondary | ICD-10-CM

## 2018-05-13 MED ORDER — ERYTHROMYCIN 5 MG/GM OP OINT
TOPICAL_OINTMENT | Freq: Once | OPHTHALMIC | Status: AC
  Administered 2018-05-13: 23:00:00 via OPHTHALMIC
  Filled 2018-05-13: qty 3.5

## 2018-05-13 MED ORDER — TETRACAINE HCL 0.5 % OP SOLN
1.0000 [drp] | Freq: Once | OPHTHALMIC | Status: AC
  Administered 2018-05-13: 1 [drp] via OPHTHALMIC
  Filled 2018-05-13: qty 4

## 2018-05-13 MED ORDER — ERYTHROMYCIN 5 MG/GM OP OINT: TOPICAL_OINTMENT | OPHTHALMIC | 1 refills | Status: DC

## 2018-05-13 MED ORDER — FLUORESCEIN SODIUM 1 MG OP STRP
1.0000 | ORAL_STRIP | Freq: Once | OPHTHALMIC | Status: AC
  Administered 2018-05-13: 1 via OPHTHALMIC
  Filled 2018-05-13: qty 1
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
## 2018-05-14 NOTE — ED Provider Notes (Signed)
[facility] EMERGENCY DEPARTMENT Provider Note   CSN: [id number] Arrival date & time: 05/13/18  2005     History   Chief Complaint Chief Complaint  Patient presents with  . Eye Injury    HPI Jeffery Smith is a 84 y.o. male.  The history is provided by the patient and the spouse.  Eye Injury  This is a new problem. The current episode started 3 to 5 hours ago. The problem occurs constantly. The problem has not changed since onset.Pertinent negatives include no chest pain and no headaches. Nothing aggravates the symptoms. Nothing relieves the symptoms. He has tried nothing for the symptoms.    Past Medical History:  Diagnosis Date  . Arthritis   . Benign head tremor   . Carotid artery occlusion    right side   . Dysrhythmia    irregular heart beat per patient- skips a beat   . Enlarged prostate   . GERD (gastroesophageal reflux disease)   . Hard of hearing   . Hyperlipidemia   . Sore throat     Patient Active Problem List   Diagnosis Date Noted  . OA (osteoarthritis) of knee 09/21/2014  . Occlusion and stenosis of carotid artery without mention of cerebral infarction 11/21/2011    Past Surgical History:  Procedure Laterality Date  . CATARACT EXTRACTION     left  . ENDARTERECTOMY  11/03/2011   Procedure: ENDARTERECTOMY CAROTID;  Surgeon: Larina Earthly, MD;  Location: [facility] OR;  Service: Vascular;  Laterality: Right;  Right Carotid endarterectomy with patch angiopolasty  . TONSILLECTOMY    . TOTAL KNEE ARTHROPLASTY     left  . TOTAL KNEE ARTHROPLASTY Right 09/21/2014   Procedure: RIGHT TOTAL KNEE ARTHROPLASTY;  Surgeon: Loanne Drilling, MD;  Location: [facility] ORS;  Service: Orthopedics;  Laterality: Right;        Home Medications    Prior to Admission medications   Medication Sig Start Date End Date Taking? Authorizing Provider  Apoaequorin (PREVAGEN EXTRA STRENGTH PO) Take 1 tablet by mouth every evening. Chewables   Yes [provider]  finasteride  (PROSCAR) 5 MG tablet Take 5 mg by mouth at bedtime.    Yes [provider]  ipratropium (ATROVENT) 0.06 % nasal spray Place 2 sprays into both nostrils at bedtime.   Yes [provider]  omeprazole (PRILOSEC) 20 MG capsule Take 20 mg by mouth at bedtime.    Yes [provider]  simvastatin (ZOCOR) 80 MG tablet Take 40 mg by mouth at bedtime.    Yes [provider]  tamsulosin (FLOMAX) 0.4 MG CAPS capsule Take 0.8 mg by mouth every evening.   Yes [provider]  erythromycin ophthalmic ointment Place a 1/2 inch ribbon of ointment into the lower eyelid twice daily for 7 days. 05/13/18   Uchenna Seufert, Barbara Cower, MD    Family History Family History  Problem Relation Age of Onset  . Cancer Mother   . Cancer Brother     Social History Social History   Tobacco Use  . Smoking status: Never Smoker  . Smokeless tobacco: Never Used  Substance Use Topics  . Alcohol use: Yes    Comment: 1 glass of wine daily  . Drug use: No     Allergies   Horse-derived products   Review of Systems Review of Systems  Cardiovascular: Negative for chest pain.  Neurological: Negative for headaches.  All other systems reviewed and are negative.    Physical Exam Updated Vital  Signs BP 140/76   Pulse 73   Temp (!) 97.5 F (36.4 C)   Resp 18   Ht 6\' 1"  (1.854 m)   Wt 79.4 kg (175 lb)   SpO2 98%   BMI 23.09 kg/m   Physical Exam  Constitutional: He appears well-developed and well-nourished.  HENT:  Head: Normocephalic and atraumatic.  Small abrasions around left eye  Eyes: Pupils are equal, round, and reactive to light. EOM and lids are normal. Right eye exhibits no discharge. Left eye exhibits discharge. Right conjunctiva is not injected. Right conjunctiva has no hemorrhage. Left conjunctiva is injected. Left conjunctiva has no hemorrhage. Right eye exhibits normal extraocular motion. Left eye exhibits normal extraocular motion.  Left eye with lateral linear  uptake on wood's lamp  Neck: Normal range of motion.  Cardiovascular: Normal rate.  Pulmonary/Chest: Effort normal. No respiratory distress.  Abdominal: He exhibits no distension.  Musculoskeletal: Normal range of motion.  Neurological: He is alert.  Nursing note and vitals reviewed.    ED Treatments / Results  Labs (all labs ordered are listed, but only abnormal results are displayed) Labs Reviewed - No data to display  EKG None  Radiology No results found.  Procedures Procedures (including critical care time)  Medications Ordered in ED Medications  tetracaine (PONTOCAINE) 0.5 % ophthalmic solution 1 drop (1 drop Left Eye Given by Other 05/13/18 2139)  fluorescein ophthalmic strip 1 strip (1 strip Left Eye Given by Other 05/13/18 2139)  erythromycin ophthalmic ointment ( Left Eye Given 05/13/18 2303)     Initial Impression / Assessment and Plan / ED Course  I have reviewed the triage vital signs and the nursing notes.  Pertinent labs & imaging results that were available during my care of the patient were reviewed by me and considered in my medical decision making (see chart for details).     Likely corneal abrasion. Vision intact if not teared up. No e/o open globe. Started on erythromycin ointment for eye and abrasion care. Will call for ophtho follow up.  Final Clinical Impressions(s) / ED Diagnoses   Final diagnoses:  Abrasion of left cornea, initial encounter  Abrasion of face, initial encounter    ED Discharge Orders        Ordered    erythromycin ophthalmic ointment     05/13/18 2317       Krina Mraz, Barbara CowerJason, MD 05/14/18 0009

## 2018-07-29 ENCOUNTER — Other Ambulatory Visit (HOSPITAL_COMMUNITY): Payer: Self-pay | Admitting: Specialist

## 2018-07-29 DIAGNOSIS — R42 Dizziness and giddiness: Secondary | ICD-10-CM

## 2018-08-12 ENCOUNTER — Ambulatory Visit (HOSPITAL_COMMUNITY)
Admission: RE | Admit: 2018-08-12 | Discharge: 2018-08-12 | Disposition: A | Discharge status: Home / Self Care | Payer: Medicare Other | Source: Ambulatory Visit | Attending: Specialist | Admitting: Specialist

## 2018-08-12 DIAGNOSIS — I6523 Occlusion and stenosis of bilateral carotid arteries: Secondary | ICD-10-CM | POA: Diagnosis not present

## 2018-08-12 DIAGNOSIS — R42 Dizziness and giddiness: Secondary | ICD-10-CM | POA: Insufficient documentation

## 2018-12-04 ENCOUNTER — Other Ambulatory Visit: Payer: Self-pay

## 2018-12-04 ENCOUNTER — Emergency Department
Admission: EM | Admit: 2018-12-04 | Discharge: 2018-12-04 | Disposition: A | Discharge status: Home / Self Care | Payer: Medicare Other | Attending: Emergency Medicine | Admitting: Emergency Medicine

## 2018-12-04 DIAGNOSIS — Z79899 Other long term (current) drug therapy: Secondary | ICD-10-CM | POA: Diagnosis not present

## 2018-12-04 DIAGNOSIS — Z96651 Presence of right artificial knee joint: Secondary | ICD-10-CM | POA: Insufficient documentation

## 2018-12-04 DIAGNOSIS — Z96652 Presence of left artificial knee joint: Secondary | ICD-10-CM | POA: Insufficient documentation

## 2018-12-04 DIAGNOSIS — R55 Syncope and collapse: Secondary | ICD-10-CM

## 2018-12-04 DIAGNOSIS — R42 Dizziness and giddiness: Secondary | ICD-10-CM | POA: Diagnosis not present

## 2018-12-04 LAB — CBC
HCT: 38.7 % — ABNORMAL LOW (ref 39.0–52.0)
Hemoglobin: 12.8 g/dL — ABNORMAL LOW (ref 13.0–17.0)
MCH: 31.4 pg (ref 26.0–34.0)
MCHC: 33.1 g/dL (ref 30.0–36.0)
MCV: 95.1 fL (ref 80.0–100.0)
Platelets: 163 10*3/uL (ref 150–400)
RBC: 4.07 MIL/uL — ABNORMAL LOW (ref 4.22–5.81)
RDW: 13.6 % (ref 11.5–15.5)
WBC: 6.6 10*3/uL (ref 4.0–10.5)
nRBC: 0 % (ref 0.0–0.2)

## 2018-12-04 LAB — TROPONIN I
Troponin I: <0.03 ng/mL (ref <0.03)
Troponin I: <0.03 ng/mL (ref <0.03)

## 2018-12-04 LAB — BASIC METABOLIC PANEL
Anion gap: 7 (ref 5–15)
BUN: 16 mg/dL (ref 8–23)
CO2: 25 mmol/L (ref 22–32)
Calcium: 9.4 mg/dL (ref 8.9–10.3)
Chloride: 108 mmol/L (ref 98–111)
Creatinine, Ser: 1.13 mg/dL (ref 0.61–1.24)
GFR calc Af Amer: >60 mL/min (ref >60)
GFR, EST NON AFRICAN AMERICAN: 57 mL/min — AB (ref >60)
Glucose, Bld: 107 mg/dL — ABNORMAL HIGH (ref 70–99)
Potassium: 4.2 mmol/L (ref 3.5–5.1)
Sodium: 140 mmol/L (ref 135–145)

## 2018-12-04 LAB — GLUCOSE, CAPILLARY: Glucose-Capillary: 87 mg/dL (ref 70–99)

## 2018-12-04 NOTE — ED Triage Notes (Signed)
Pt here with wife. States that he was eating breakfast and had near syncopal episode that last approx 3 minutes. Denies confusion, slurred speech, weakness.  Wife reports pt is at complete baseline. Pt alert and oriented X 4.

## 2018-12-04 NOTE — ED Notes (Signed)
Troponin drawn and sent . Patient currently denies any discomfort. Family at bedside.

## 2018-12-04 NOTE — Discharge Instructions (Addendum)
Please seek medical attention for any high fevers, chest pain, shortness of breath, change in behavior, persistent vomiting, bloody stool or any other new or concerning symptoms.  

## 2018-12-04 NOTE — ED Provider Notes (Signed)
[facility] Emergency Department Provider Note  ____________________________________________   I have reviewed the triage vital signs and the nursing notes.   HISTORY  Chief Complaint Near Syncope   History limited by: Not Limited   HPI Jeffery Smith is a 85 y.o. male who presents to the emergency department today because of an episode of near syncope.  The patient was sitting down having breakfast when occurred.  All of a sudden he started feeling dizzy.  He was looking for objects to hold onto.  He did not lose consciousness.  He denies any chest pain or palpitations at the time.  Denies any headache.  The whole episode lasted roughly 3 minutes.  Since then he has felt fine.  No recent illnesses.  No recent fevers.  His appetite has been normal.   Per medical record review patient has a history of GERD, carotid artery occlusion  Past Medical History:  Diagnosis Date  . Arthritis   . Benign head tremor   . Carotid artery occlusion    right side   . Dysrhythmia    irregular heart beat per patient- skips a beat   . Enlarged prostate   . GERD (gastroesophageal reflux disease)   . Hard of hearing   . Hyperlipidemia   . Sore throat     Patient Active Problem List   Diagnosis Date Noted  . OA (osteoarthritis) of knee 09/21/2014  . Occlusion and stenosis of carotid artery without mention of cerebral infarction 11/21/2011    Past Surgical History:  Procedure Laterality Date  . CATARACT EXTRACTION     left  . ENDARTERECTOMY  11/03/2011   Procedure: ENDARTERECTOMY CAROTID;  Surgeon: Larina Earthly, MD;  Location: [facility] OR;  Service: Vascular;  Laterality: Right;  Right Carotid endarterectomy with patch angiopolasty  . TONSILLECTOMY    . TOTAL KNEE ARTHROPLASTY     left  . TOTAL KNEE ARTHROPLASTY Right 09/21/2014   Procedure: RIGHT TOTAL KNEE ARTHROPLASTY;  Surgeon: Loanne Drilling, MD;  Location: [facility] ORS;  Service: Orthopedics;  Laterality: Right;     Prior to Admission medications   Medication Sig Start Date End Date Taking? Authorizing Provider  Apoaequorin (PREVAGEN EXTRA STRENGTH PO) Take 1 tablet by mouth every evening. Chewables    [provider]  erythromycin ophthalmic ointment Place a 1/2 inch ribbon of ointment into the lower eyelid twice daily for 7 days. 05/13/18   Mesner, Barbara Cower, MD  finasteride (PROSCAR) 5 MG tablet Take 5 mg by mouth at bedtime.     [provider]  ipratropium (ATROVENT) 0.06 % nasal spray Place 2 sprays into both nostrils at bedtime.    [provider]  omeprazole (PRILOSEC) 20 MG capsule Take 20 mg by mouth at bedtime.     [provider]  simvastatin (ZOCOR) 80 MG tablet Take 40 mg by mouth at bedtime.     [provider]  tamsulosin (FLOMAX) 0.4 MG CAPS capsule Take 0.8 mg by mouth every evening.    [provider]    Allergies Horse-derived products  Family History  Problem Relation Age of Onset  . Cancer Mother   . Cancer Brother     Social History Social History   Tobacco Use  . Smoking status: Never Smoker  . Smokeless tobacco: Never Used  Substance Use Topics  . Alcohol use: Yes    Comment: 1 glass of wine daily  . Drug use: No    Review of Systems Constitutional:  No fever/chills Eyes: No visual changes. ENT: No sore throat. Cardiovascular: Denies chest pain. Respiratory: Denies shortness of breath. Gastrointestinal: No abdominal pain.  No nausea, no vomiting.  No diarrhea.   Genitourinary: Negative for dysuria. Musculoskeletal: Negative for back pain. Skin: Negative for rash. Neurological: Positive for dizziness. ____________________________________________   PHYSICAL EXAM:  VITAL SIGNS: ED Triage Vitals  Enc Vitals Group     BP 12/04/18 1059 115/60     Pulse Rate 12/04/18 1059 68     Resp 12/04/18 1059 18     Temp 12/04/18 1059 97.8 F (36.6 C)     Temp Source 12/04/18 1059 Oral     SpO2 12/04/18 1059 97 %      Weight 12/04/18 1100 180 lb (81.6 kg)     Height 12/04/18 1100 6' (1.829 m)     Head Circumference --      Peak Flow --      Pain Score 12/04/18 1059 0   Constitutional: Alert and oriented.  Eyes: Conjunctivae are normal.  ENT      Head: Normocephalic and atraumatic.      Nose: No congestion/rhinnorhea.      Mouth/Throat: Mucous membranes are moist.      Neck: No stridor. Hematological/Lymphatic/Immunilogical: No cervical lymphadenopathy. Cardiovascular: Normal rate, regular rhythm.  Positive for systolic murmur Respiratory: Normal respiratory effort without tachypnea nor retractions. Breath sounds are clear and equal bilaterally. No wheezes/rales/rhonchi. Gastrointestinal: Soft and non tender. No rebound. No guarding.  Genitourinary: Deferred Musculoskeletal: Normal range of motion in all extremities. No lower extremity edema. Neurologic:  Normal speech and language. No gross focal neurologic deficits are appreciated.  Skin:  Skin is warm, dry and intact. No rash noted. Psychiatric: Mood and affect are normal. Speech and behavior are normal. Patient exhibits appropriate insight and judgment.  ____________________________________________    LABS (pertinent positives/negatives)  Trop <0.03 x 2 CBC wbc 6.6, hgb 12.8, plt 163 BMP wnl except glu 107, GFR 57  ____________________________________________   EKG  I, Phineas Semen, attending physician, personally viewed and interpreted this EKG  EKG Time: 1105 Rate: 68 Rhythm: normal sinus rhythm Axis: left axis deviation Intervals: qtc 463 QRS: RBBB, LAFB ST changes: no st elevation Impression: abnormal ekg  ____________________________________________    RADIOLOGY  None  ____________________________________________   PROCEDURES  Procedures  ____________________________________________   INITIAL IMPRESSION / ASSESSMENT AND PLAN / ED COURSE  Pertinent labs & imaging results that were available during  my care of the patient were reviewed by me and considered in my medical decision making (see chart for details).   Patient presented to the emergency department today because of concerns for a brief episode of dizziness.  Patient's work-up excluding 2 troponins negative here.  Patient is not terribly anemic.  No concerning electrolyte abnormalities.  Patient without any further symptoms.  Did discuss with patient that we would like him to follow-up with primary care.  Discussed return precautions.   ____________________________________________   FINAL CLINICAL IMPRESSION(S) / ED DIAGNOSES  Final diagnoses:  Near syncope     Note: This dictation was prepared with Dragon dictation. Any transcriptional errors that result from this process are unintentional     Phineas Semen, MD 12/04/18 [phone]

## 2019-06-18 ENCOUNTER — Other Ambulatory Visit: Payer: Self-pay

## 2019-06-18 ENCOUNTER — Emergency Department (HOSPITAL_COMMUNITY)
Admission: EM | Admit: 2019-06-18 | Discharge: 2019-06-18 | Disposition: A | Discharge status: Home / Self Care | Payer: Medicare Other | Attending: Emergency Medicine | Admitting: Emergency Medicine

## 2019-06-18 ENCOUNTER — Emergency Department (HOSPITAL_COMMUNITY): Payer: Medicare Other

## 2019-06-18 ENCOUNTER — Encounter (HOSPITAL_COMMUNITY): Payer: Self-pay | Admitting: Emergency Medicine

## 2019-06-18 DIAGNOSIS — R0789 Other chest pain: Secondary | ICD-10-CM | POA: Diagnosis not present

## 2019-06-18 DIAGNOSIS — Z96653 Presence of artificial knee joint, bilateral: Secondary | ICD-10-CM | POA: Diagnosis not present

## 2019-06-18 DIAGNOSIS — R079 Chest pain, unspecified: Secondary | ICD-10-CM

## 2019-06-18 DIAGNOSIS — Z79899 Other long term (current) drug therapy: Secondary | ICD-10-CM | POA: Diagnosis not present

## 2019-06-18 DIAGNOSIS — R110 Nausea: Secondary | ICD-10-CM | POA: Diagnosis not present

## 2019-06-18 LAB — CBC WITH DIFFERENTIAL/PLATELET
Abs Immature Granulocytes: 0.01 10*3/uL (ref 0.00–0.07)
Basophils Absolute: 0 10*3/uL (ref 0.0–0.1)
Basophils Relative: 1 %
Eosinophils Absolute: 0.3 10*3/uL (ref 0.0–0.5)
Eosinophils Relative: 5 %
HCT: 37.7 % — ABNORMAL LOW (ref 39.0–52.0)
Hemoglobin: 12.2 g/dL — ABNORMAL LOW (ref 13.0–17.0)
Immature Granulocytes: 0 %
Lymphocytes Relative: 23 %
Lymphs Abs: 1.2 10*3/uL (ref 0.7–4.0)
MCH: 31.9 pg (ref 26.0–34.0)
MCHC: 32.4 g/dL (ref 30.0–36.0)
MCV: 98.4 fL (ref 80.0–100.0)
Monocytes Absolute: 0.6 10*3/uL (ref 0.1–1.0)
Monocytes Relative: 12 %
Neutro Abs: 3.1 10*3/uL (ref 1.7–7.7)
Neutrophils Relative %: 59 %
Platelets: 176 10*3/uL (ref 150–400)
RBC: 3.83 MIL/uL — ABNORMAL LOW (ref 4.22–5.81)
RDW: 13.5 % (ref 11.5–15.5)
WBC: 5.2 10*3/uL (ref 4.0–10.5)
nRBC: 0 % (ref 0.0–0.2)

## 2019-06-18 LAB — COMPREHENSIVE METABOLIC PANEL
ALT: 14 U/L (ref 0–44)
AST: 20 U/L (ref 15–41)
Albumin: 3.6 g/dL (ref 3.5–5.0)
Alkaline Phosphatase: 54 U/L (ref 38–126)
Anion gap: 6 (ref 5–15)
BUN: 20 mg/dL (ref 8–23)
CO2: 23 mmol/L (ref 22–32)
Calcium: 8.8 mg/dL — ABNORMAL LOW (ref 8.9–10.3)
Chloride: 107 mmol/L (ref 98–111)
Creatinine, Ser: 1.24 mg/dL (ref 0.61–1.24)
GFR calc Af Amer: 58 mL/min — ABNORMAL LOW (ref >60)
GFR calc non Af Amer: 50 mL/min — ABNORMAL LOW (ref >60)
Glucose, Bld: 105 mg/dL — ABNORMAL HIGH (ref 70–99)
Potassium: 3.9 mmol/L (ref 3.5–5.1)
Sodium: 136 mmol/L (ref 135–145)
Total Bilirubin: 0.5 mg/dL (ref 0.3–1.2)
Total Protein: 5.9 g/dL — ABNORMAL LOW (ref 6.5–8.1)

## 2019-06-18 LAB — TROPONIN I (HIGH SENSITIVITY)
Troponin I (High Sensitivity): 7 ng/L (ref <18)
Troponin I (High Sensitivity): 6 ng/L (ref <18)

## 2019-06-18 LAB — LIPASE, BLOOD: Lipase: 27 U/L (ref 11–51)

## 2019-06-18 MED ORDER — ASPIRIN 81 MG PO CHEW
324.0000 mg | CHEWABLE_TABLET | Freq: Once | ORAL | Status: AC
  Administered 2019-06-18: 06:00:00 324 mg via ORAL
  Filled 2019-06-18: qty 4

## 2019-06-18 NOTE — Discharge Instructions (Addendum)
You were seen in the emergency department today with chest discomfort.  Your lab test today are normal but I would like you to follow with a cardiologist very closely.  I have placed a referral to be seen in their clinic.  Please call today to schedule that appointment.  Return to the emergency department immediately with any new or worsening symptoms.

## 2019-06-18 NOTE — ED Triage Notes (Signed)
Pt C/O chest pain that woke him up from sleep. Pt states at the same time he felt nauseas. Pt states the pain lasted for 15 minutes. Denies pain at this time.

## 2019-06-18 NOTE — ED Provider Notes (Signed)
[facility] EMERGENCY DEPARTMENT Provider Note   CSN: [id number] Arrival date & time: 06/18/19  1410     History   Chief Complaint Chief Complaint  Patient presents with  . Chest Pain    HPI Jeffery Smith is a 85 y.o. male.     Patient presents from home with episode of left-sided chest pain that woke him from sleep about 1 hour ago.  States the pain felt like a dull ache and pressure on the left side of his chest.  It was associate with some nausea.  There is no radiation of the pain to his arm, back or neck.  There is no shortness of breath, cough, fever, diaphoresis or syncope.  The pain is now resolved.  He states it lasted about 15 minutes.  He has never had this kind of pain before.  He denies any cardiac history.  He gets most of his care at the [place] and does not think he is ever had a stress test.  He denies any history of hypertension, hyperlipidemia, diabetes.  He is not a smoker.  He denies any history of ulcers or acid reflux though that is listed in his history. He denies any abdominal pain, leg pain or back pain. He has no pain at this time and feels well.  The history is provided by the patient.    Past Medical History:  Diagnosis Date  . Arthritis   . Benign head tremor   . Carotid artery occlusion    right side   . Dysrhythmia    irregular heart beat per patient- skips a beat   . Enlarged prostate   . GERD (gastroesophageal reflux disease)   . Hard of hearing   . Hyperlipidemia   . Sore throat     Patient Active Problem List   Diagnosis Date Noted  . OA (osteoarthritis) of knee 09/21/2014  . Occlusion and stenosis of carotid artery without mention of cerebral infarction 11/21/2011    Past Surgical History:  Procedure Laterality Date  . CATARACT EXTRACTION     left  . ENDARTERECTOMY  11/03/2011   Procedure: ENDARTERECTOMY CAROTID;  Surgeon: Larina Earthly, MD;  Location: [facility] OR;  Service: Vascular;  Laterality: Right;  Right Carotid endarterectomy  with patch angiopolasty  . TONSILLECTOMY    . TOTAL KNEE ARTHROPLASTY     left  . TOTAL KNEE ARTHROPLASTY Right 09/21/2014   Procedure: RIGHT TOTAL KNEE ARTHROPLASTY;  Surgeon: Loanne Drilling, MD;  Location: [facility] ORS;  Service: Orthopedics;  Laterality: Right;        Home Medications    Prior to Admission medications   Medication Sig Start Date End Date Taking? Authorizing Provider  Apoaequorin (PREVAGEN EXTRA STRENGTH PO) Take 1 tablet by mouth every evening. Chewables    [provider]  erythromycin ophthalmic ointment Place a 1/2 inch ribbon of ointment into the lower eyelid twice daily for 7 days. 05/13/18   Mesner, Barbara Cower, MD  finasteride (PROSCAR) 5 MG tablet Take 5 mg by mouth at bedtime.     [provider]  ipratropium (ATROVENT) 0.06 % nasal spray Place 2 sprays into both nostrils at bedtime.    [provider]  omeprazole (PRILOSEC) 20 MG capsule Take 20 mg by mouth at bedtime.     [provider]  simvastatin (ZOCOR) 80 MG tablet Take 40 mg by mouth at bedtime.     [provider]  tamsulosin (FLOMAX) 0.4 MG CAPS capsule Take 0.8 mg  by mouth every evening.    [provider]    Family History Family History  Problem Relation Age of Onset  . Cancer Mother   . Cancer Brother     Social History Social History   Tobacco Use  . Smoking status: Never Smoker  . Smokeless tobacco: Never Used  Substance Use Topics  . Alcohol use: Yes    Comment: 1 glass of wine daily  . Drug use: No     Allergies   Horse-derived products   Review of Systems Review of Systems  Constitutional: Negative for activity change, appetite change and fever.  HENT: Negative for rhinorrhea.   Eyes: Negative for visual disturbance.  Respiratory: Positive for chest tightness. Negative for cough and shortness of breath.   Cardiovascular: Positive for chest pain.  Gastrointestinal: Positive for nausea. Negative for vomiting.  Genitourinary:  Negative for dysuria and hematuria.  Musculoskeletal: Negative for arthralgias and myalgias.  Skin: Negative for rash.  Neurological: Negative for dizziness, weakness and headaches.    all other systems are negative except as noted in the HPI and PMH.    Physical Exam Updated Vital Signs BP 127/76   Pulse 70   Temp (!) 97.5 F (36.4 C)   Resp 15   Wt 86 kg   SpO2 95%   BMI 25.71 kg/m   Physical Exam Vitals signs and nursing note reviewed.  Constitutional:      General: He is not in acute distress.    Appearance: He is well-developed.  HENT:     Head: Normocephalic and atraumatic.     Mouth/Throat:     Pharynx: No oropharyngeal exudate.  Eyes:     Conjunctiva/sclera: Conjunctivae normal.     Pupils: Pupils are equal, round, and reactive to light.  Neck:     Musculoskeletal: Normal range of motion and neck supple.     Comments: No meningismus. Cardiovascular:     Rate and Rhythm: Normal rate and regular rhythm.     Heart sounds: Murmur present.     Comments: 3/6 systolic murmur present Equal radial pulses and grip strength Pulmonary:     Effort: Pulmonary effort is normal. No respiratory distress.     Breath sounds: Normal breath sounds.  Abdominal:     Palpations: Abdomen is soft.     Tenderness: There is no abdominal tenderness. There is no guarding or rebound.  Musculoskeletal: Normal range of motion.        General: No tenderness.  Skin:    General: Skin is warm.     Capillary Refill: Capillary refill takes less than 2 seconds.  Neurological:     General: No focal deficit present.     Mental Status: He is alert and oriented to person, place, and time. Mental status is at baseline.     Cranial Nerves: No cranial nerve deficit.     Motor: No abnormal muscle tone.     Coordination: Coordination normal.     Comments: No ataxia on finger to nose bilaterally. No pronator drift. 5/5 strength throughout. CN 2-12 intact.Equal grip strength. Sensation intact.    Psychiatric:        Behavior: Behavior normal.      ED Treatments / Results  Labs (all labs ordered are listed, but only abnormal results are displayed) Labs Reviewed  CBC WITH DIFFERENTIAL/PLATELET - Abnormal; Notable for the following components:      Result Value   RBC 3.83 (*)    Hemoglobin 12.2 (*)  HCT 37.7 (*)    All other components within normal limits  COMPREHENSIVE METABOLIC PANEL - Abnormal; Notable for the following components:   Glucose, Bld 105 (*)    Calcium 8.8 (*)    Total Protein 5.9 (*)    GFR calc non Af Amer 50 (*)    GFR calc Af Amer 58 (*)    All other components within normal limits  LIPASE, BLOOD  TROPONIN I (HIGH SENSITIVITY)    EKG EKG Interpretation  Date/Time:  Wednesday June 18 2019 05:45:46 EDT Ventricular Rate:  71 PR Interval:    QRS Duration: 147 QT Interval:  462 QTC Calculation: 503 R Axis:   -62 Text Interpretation:  Normal sinus rhythm RBBB and LAFB Probable left ventricular hypertrophy Anterior Q waves, possibly due to LVH No significant change was found Confirmed by Ezequiel Essex [phone]) on 06/18/2019 5:55:55 AM   Radiology No results found.  Procedures Procedures (including critical care time)  Medications Ordered in ED Medications  aspirin chewable tablet 324 mg (has no administration in time range)     Initial Impression / Assessment and Plan / ED Course  I have reviewed the triage vital signs and the nursing notes.  Pertinent labs & imaging results that were available during my care of the patient were reviewed by me and considered in my medical decision making (see chart for details).       Episode of chest pain associate with nausea, now resolved after about 15 minutes.  EKG shows unchanged right bundle branch block and left anterior fascicular block.  No acute ST changes.  Patient given aspirin. Labs and chest x-ray will be obtained.  Heart score is at least 4.   Labs reassuring, troponin  negative.  Patient remains chest pain-free.  Observation admission discussed with patient given his chest pain and risk factors.  He is adamant that he wants to go home and does not want to stay in the hospital.  He is agreeable to a second troponin.  Discussed with patient and his wife that he could be at risk for heart attack which can be life-threatening.  He is agreeable to a second troponin but still states he wants to go home.  Care will be transferred at shift change.    Final Clinical Impressions(s) / ED Diagnoses   Final diagnoses:  None    ED Discharge Orders    None       Nalee Lightle, Annie Main, MD 06/18/19 [phone]

## 2019-06-18 NOTE — ED Notes (Signed)
2nd troponin drawn

## 2019-06-18 NOTE — ED Provider Notes (Signed)
Blood pressure 127/76, pulse 70, temperature (!) 97.5 F (36.4 C), resp. rate 15, weight 86 kg, SpO2 95 %.  Assuming care from Dr. Wyvonnia Dusky.  In short, Jeffery Smith is a 85 y.o. male with a chief complaint of Chest Pain .  Refer to the original H&P for additional details.  The current plan of care is to follow up on second troponin and reassess.  08:50 AM  Repeat troponin is not significantly changed.  Discussed results with the patient.  Plan for follow up with Cardiology. ED return precautions discussed in detail.    Margette Fast, MD 06/18/19 [phone]

## 2019-07-02 ENCOUNTER — Ambulatory Visit (INDEPENDENT_AMBULATORY_CARE_PROVIDER_SITE_OTHER): Payer: Medicare Other | Admitting: Cardiovascular Disease

## 2019-07-02 ENCOUNTER — Encounter: Payer: Self-pay | Admitting: Cardiovascular Disease

## 2019-07-02 ENCOUNTER — Other Ambulatory Visit: Payer: Self-pay

## 2019-07-02 VITALS — BP 103/63 | HR 82 | Temp 97.1°F | Ht 73.0 in | Wt 189.0 lb

## 2019-07-02 DIAGNOSIS — Z9889 Other specified postprocedural states: Secondary | ICD-10-CM

## 2019-07-02 DIAGNOSIS — R079 Chest pain, unspecified: Secondary | ICD-10-CM

## 2019-07-02 DIAGNOSIS — E785 Hyperlipidemia, unspecified: Secondary | ICD-10-CM | POA: Diagnosis not present

## 2019-07-02 DIAGNOSIS — R011 Cardiac murmur, unspecified: Secondary | ICD-10-CM | POA: Diagnosis not present

## 2019-07-02 NOTE — Patient Instructions (Signed)
Medication Instructions:  Your physician recommends that you continue on your current medications as directed. Please refer to the Current Medication list given to you today.   Labwork: NONE  Testing/Procedures: Your physician has requested that you have an echocardiogram. Echocardiography is a painless test that uses sound waves to create images of your heart. It provides your doctor with information about the size and shape of your heart and how well your heart's chambers and valves are working. This procedure takes approximately one hour. There are no restrictions for this procedure.  Your physician has requested that you have a lexiscan myoview. For further information please visit [email or web address]. Please follow instruction sheet, as given.    Follow-Up: Your physician recommends that you schedule a follow-up appointment in: 3 MONTHS    Any Other Special Instructions Will Be Listed Below (If Applicable).     If you need a refill on your cardiac medications before your next appointment, please call your pharmacy.

## 2019-07-02 NOTE — Progress Notes (Signed)
CARDIOLOGY CONSULT NOTE  Patient ID: Jeffery Smith MRN: [id number] DOB/AGE: Apr 24, 1925 85 y.o.  Admit date: (Not on file) Primary Physician: Myrla Halsted, MD Referring Physician: Dr. Nanda Quinton  Reason for Consultation: Chest pain  HPI: Jeffery Smith is a 85 y.o. male who is being seen today for the evaluation of chest pain at the request of Long, Wonda Olds, MD.   He was evaluated in the ED for chest pain on 06/18/2019.  I personally reviewed all of and documentation, labs, and studies.  High-sensitivity troponins were normal.  Hemoglobin was mildly low at 12.2.  Comprehensive metabolic panel was unremarkable.  Chest x-ray showed chronic bronchitic markings and cardiomegaly.  Carotid Doppler showed less than 50% stenosis in bilateral internal carotid arteries on 08/12/2018.  I personally reviewed ECG performed on 06/18/2019 which demonstrated sinus rhythm with right bundle branch block, left anterior fascicular block, and LVH.  He is here with his wife.  He told me that he had about 30 minutes of left-sided chest pain which he described as dull.  It resolved before he came to the ED.  He denies any prior episodes of chest pain.  He denies exertional dyspnea.  He also denies orthopnea, palpitations, leg swelling, syncope, and paroxysmal nocturnal dyspnea.  He stays active on a farm.  Social history: He was previously married for over 3 years.  His wife was previously married for over 70 years.  They met at a senior choir.  Allergies  Allergen Reactions  . Horse-Derived Products Anaphylaxis    Current Outpatient Medications  Medication Sig Dispense Refill  . finasteride (PROSCAR) 5 MG tablet Take 5 mg by mouth at bedtime.     Marland Kitchen ipratropium (ATROVENT) 0.06 % nasal spray Place 2 sprays into both nostrils at bedtime.    Marland Kitchen omeprazole (PRILOSEC) 20 MG capsule Take 20 mg by mouth at bedtime.     . simvastatin (ZOCOR) 80 MG tablet Take 40 mg by mouth at  bedtime.     . tamsulosin (FLOMAX) 0.4 MG CAPS capsule Take 0.8 mg by mouth every evening.    Marland Kitchen Apoaequorin (PREVAGEN EXTRA STRENGTH PO) Take 1 tablet by mouth every evening. Chewables     No current facility-administered medications for this visit.     Past Medical History:  Diagnosis Date  . Arthritis   . Benign head tremor   . Carotid artery occlusion    right side   . Dysrhythmia    irregular heart beat per patient- skips a beat   . Enlarged prostate   . GERD (gastroesophageal reflux disease)   . Hard of hearing   . Hyperlipidemia   . Sore throat     Past Surgical History:  Procedure Laterality Date  . CATARACT EXTRACTION     left  . ENDARTERECTOMY  11/03/2011   Procedure: ENDARTERECTOMY CAROTID;  Surgeon: Rosetta Posner, MD;  Location: [facility] OR;  Service: Vascular;  Laterality: Right;  Right Carotid endarterectomy with patch angiopolasty  . TONSILLECTOMY    . TOTAL KNEE ARTHROPLASTY     left  . TOTAL KNEE ARTHROPLASTY Right 09/21/2014   Procedure: RIGHT TOTAL KNEE ARTHROPLASTY;  Surgeon: Gearlean Alf, MD;  Location: [facility] ORS;  Service: Orthopedics;  Laterality: Right;    Social History   Socioeconomic History  . Marital status: Widowed    Spouse name: Not on file  . Number of children: Not on file  . Years of education: Not on file  .  Highest education level: Not on file  Occupational History  . Not on file  Social Needs  . Financial resource strain: Not on file  . Food insecurity    Worry: Not on file    Inability: Not on file  . Transportation needs    Medical: Not on file    Non-medical: Not on file  Tobacco Use  . Smoking status: Never Smoker  . Smokeless tobacco: Never Used  Substance and Sexual Activity  . Alcohol use: Yes    Comment: 1 glass of wine daily  . Drug use: No  . Sexual activity: Not on file  Lifestyle  . Physical activity    Days per week: Not on file    Minutes per session: Not on file  . Stress: Not on file  Relationships  .  Social Herbalist on phone: Not on file    Gets together: Not on file    Attends religious service: Not on file    Active member of club or organization: Not on file    Attends meetings of clubs or organizations: Not on file    Relationship status: Not on file  . Intimate partner violence    Fear of current or ex partner: Not on file    Emotionally abused: Not on file    Physically abused: Not on file    Forced sexual activity: Not on file  Other Topics Concern  . Not on file  Social History Narrative  . Not on file     No family history of premature CAD in 1st degree relatives.  Current Meds  Medication Sig  . finasteride (PROSCAR) 5 MG tablet Take 5 mg by mouth at bedtime.   Marland Kitchen ipratropium (ATROVENT) 0.06 % nasal spray Place 2 sprays into both nostrils at bedtime.  Marland Kitchen omeprazole (PRILOSEC) 20 MG capsule Take 20 mg by mouth at bedtime.   . simvastatin (ZOCOR) 80 MG tablet Take 40 mg by mouth at bedtime.   . tamsulosin (FLOMAX) 0.4 MG CAPS capsule Take 0.8 mg by mouth every evening.      Review of systems complete and found to be negative unless listed above in HPI    Physical exam Blood pressure 103/63, pulse 82, temperature (!) 97.1 F (36.2 C), height 6' 1"  (1.854 m), weight 189 lb (85.7 kg). General: NAD Neck: No JVD, no thyromegaly or thyroid nodule.  Lungs: Clear to auscultation bilaterally with normal respiratory effort. CV: Nondisplaced PMI. Regular rate and rhythm, normal S1/S2, no S3/S4, harsh 3/6 ejection systolic murmur loudest over right upper sternal border.  No peripheral edema.      Abdomen: Soft, nontender, no distention.  Skin: Intact without lesions or rashes.  Neurologic: Alert and oriented x 3.  Psych: Normal affect. Extremities: No clubbing or cyanosis.  HEENT: Normal.   ECG: Most recent ECG reviewed.   Labs: Lab Results  Component Value Date/Time   K 3.9 06/18/2019 06:01 AM   BUN 20 06/18/2019 06:01 AM   CREATININE 1.24  06/18/2019 06:01 AM   ALT 14 06/18/2019 06:01 AM   HGB 12.2 (L) 06/18/2019 06:01 AM     Lipids: No results found for: LDLCALC, LDLDIRECT, CHOL, TRIG, HDL      ASSESSMENT AND PLAN:   1.  Chest pain: Isolated episode.  Risk factors include age and hyperlipidemia.  I will proceed with a nuclear myocardial perfusion imaging study to evaluate for ischemic heart disease (Lexiscan Myoview).  2.  Hyperlipidemia: On simvastatin  40 mg.  3.  Cardiac murmur: He likely has some degree of aortic stenosis. I will order a 2-D echocardiogram with Doppler to evaluate cardiac structure, function, and regional wall motion.  4.  History of right carotid endarterectomy: Carotid Dopplers November 2019 showed less than 50% bilateral internal carotid artery stenosis.  He is on simvastatin 40 mg.   Disposition: Follow up in 3 months  Signed: Kate Sable, M.D., F.A.C.C.  07/02/2019, 1:25 PM

## 2019-07-15 ENCOUNTER — Ambulatory Visit: Payer: Medicare Other | Admitting: Cardiovascular Disease

## 2019-07-16 ENCOUNTER — Telehealth: Payer: Self-pay | Admitting: *Deleted

## 2019-07-16 ENCOUNTER — Other Ambulatory Visit: Payer: Self-pay

## 2019-07-16 ENCOUNTER — Encounter (HOSPITAL_BASED_OUTPATIENT_CLINIC_OR_DEPARTMENT_OTHER)
Admission: RE | Admit: 2019-07-16 | Discharge: 2019-07-16 | Disposition: A | Discharge status: Home / Self Care | Payer: Medicare Other | Source: Ambulatory Visit | Attending: Cardiovascular Disease | Admitting: Cardiovascular Disease

## 2019-07-16 ENCOUNTER — Ambulatory Visit (HOSPITAL_BASED_OUTPATIENT_CLINIC_OR_DEPARTMENT_OTHER)
Admission: RE | Admit: 2019-07-16 | Discharge: 2019-07-16 | Disposition: A | Discharge status: Home / Self Care | Payer: Medicare Other | Source: Ambulatory Visit | Attending: Cardiovascular Disease | Admitting: Cardiovascular Disease

## 2019-07-16 ENCOUNTER — Ambulatory Visit (HOSPITAL_COMMUNITY)
Admission: RE | Admit: 2019-07-16 | Discharge: 2019-07-16 | Disposition: A | Discharge status: Home / Self Care | Payer: Medicare Other | Source: Ambulatory Visit | Attending: Cardiovascular Disease | Admitting: Cardiovascular Disease

## 2019-07-16 DIAGNOSIS — Q219 Congenital malformation of cardiac septum, unspecified: Secondary | ICD-10-CM | POA: Diagnosis not present

## 2019-07-16 DIAGNOSIS — R079 Chest pain, unspecified: Secondary | ICD-10-CM | POA: Insufficient documentation

## 2019-07-16 DIAGNOSIS — R011 Cardiac murmur, unspecified: Secondary | ICD-10-CM

## 2019-07-16 DIAGNOSIS — E785 Hyperlipidemia, unspecified: Secondary | ICD-10-CM | POA: Insufficient documentation

## 2019-07-16 DIAGNOSIS — I083 Combined rheumatic disorders of mitral, aortic and tricuspid valves: Secondary | ICD-10-CM | POA: Diagnosis not present

## 2019-07-16 LAB — NM MYOCAR MULTI W/SPECT W/WALL MOTION / EF
LV dias vol: 93 mL (ref 62–150)
LV sys vol: 45 mL
Peak HR: 88 {beats}/min
RATE: 0.34
Rest HR: 62 {beats}/min
SDS: 0
SRS: 4
SSS: 4
TID: 1.06

## 2019-07-16 MED ORDER — TECHNETIUM TC 99M TETROFOSMIN IV KIT
10.0000 | PACK | Freq: Once | INTRAVENOUS | Status: AC | PRN
  Administered 2019-07-16: 10.47 via INTRAVENOUS

## 2019-07-16 MED ORDER — TECHNETIUM TC 99M TETROFOSMIN IV KIT
30.0000 | PACK | Freq: Once | INTRAVENOUS | Status: AC | PRN
  Administered 2019-07-16: 33 via INTRAVENOUS

## 2019-07-16 MED ORDER — SODIUM CHLORIDE 0.9% FLUSH
INTRAVENOUS | Status: AC
  Administered 2019-07-16: 10 mL via INTRAVENOUS
  Filled 2019-07-16: qty 10

## 2019-07-16 MED ORDER — REGADENOSON 0.4 MG/5ML IV SOLN
INTRAVENOUS | Status: AC
  Administered 2019-07-16: 0.4 mg via INTRAVENOUS
  Filled 2019-07-16: qty 5

## 2019-07-16 NOTE — Telephone Encounter (Signed)
-----   Message from Herminio Commons, MD sent at 07/16/2019 11:20 AM EDT ----- Normal pumping function.  Aortic valve is significantly calcified and has moderate to severe narrowing.  I will continue to monitor symptoms and reevaluate at his next office visit.

## 2019-07-16 NOTE — Progress Notes (Signed)
  Echocardiogram 2D Echocardiogram has been performed.  Jeffery Smith 07/16/2019, 9:12 AM

## 2019-07-16 NOTE — Telephone Encounter (Signed)
Called patient with test results. No answer. Left message to call back.  

## 2019-10-07 ENCOUNTER — Telehealth: Payer: Self-pay | Admitting: Student

## 2019-10-07 ENCOUNTER — Other Ambulatory Visit: Payer: Self-pay

## 2019-10-07 ENCOUNTER — Encounter: Payer: Self-pay | Admitting: Student

## 2019-10-07 ENCOUNTER — Ambulatory Visit (INDEPENDENT_AMBULATORY_CARE_PROVIDER_SITE_OTHER): Payer: Medicare Other | Admitting: Student

## 2019-10-07 VITALS — BP 122/72 | HR 79 | Ht 73.0 in | Wt 185.0 lb

## 2019-10-07 DIAGNOSIS — I6523 Occlusion and stenosis of bilateral carotid arteries: Secondary | ICD-10-CM | POA: Diagnosis not present

## 2019-10-07 DIAGNOSIS — I350 Nonrheumatic aortic (valve) stenosis: Secondary | ICD-10-CM

## 2019-10-07 DIAGNOSIS — E785 Hyperlipidemia, unspecified: Secondary | ICD-10-CM | POA: Diagnosis not present

## 2019-10-07 DIAGNOSIS — R079 Chest pain, unspecified: Secondary | ICD-10-CM

## 2019-10-07 MED ORDER — ASPIRIN EC 81 MG PO TBEC: 81.0000 mg | DELAYED_RELEASE_TABLET | Freq: Every day | ORAL | Status: AC

## 2019-10-07 NOTE — Telephone Encounter (Signed)
  Precert needed for: Echo   Location: [facility]    Date: April 06, 2020

## 2019-10-07 NOTE — Patient Instructions (Signed)
Medication Instructions:  Your physician recommends that you continue on your current medications as directed. Please refer to the Current Medication list given to you today.  *If you need a refill on your cardiac medications before your next appointment, please call your pharmacy*  Lab Work: None today If you have labs (blood work) drawn today and your tests are completely normal, you will receive your results only by: Marland Kitchen MyChart Message (if you have MyChart) OR . A paper copy in the mail If you have any lab test that is abnormal or we need to change your treatment, we will call you to review the results.  Testing/Procedures: Your physician has requested that you have an echocardiogram in 6 months. Echocardiography is a painless test that uses sound waves to create images of your heart. It provides your doctor with information about the size and shape of your heart and how well your heart's chambers and valves are working. This procedure takes approximately one hour. There are no restrictions for this procedure.    Follow-Up: At [facility], you and your health needs are our priority.  As part of our continuing mission to provide you with exceptional heart care, we have created designated Provider Care Teams.  These Care Teams include your primary Cardiologist (physician) and Advanced Practice Providers (APPs -  Physician Assistants and Nurse Practitioners) who all work together to provide you with the care you need, when you need it.  Your next appointment:   7 month(s)  The format for your next appointment:   In Person  Provider:   Kate Sable, MD  Other Instructions None     Thank you for choosing [facility] !

## 2019-10-07 NOTE — Progress Notes (Signed)
Cardiology Office Note    Date:  10/07/2019   ID:  [name], [name] [birth year], MRN [id number]  PCP:  Wallace Cullens, MD  Cardiologist: Prentice Docker, MD    Chief Complaint  Patient presents with   Follow-up    3 month visit    History of Present Illness:    Jeffery Smith is a 85 y.o. male with past medical history of HLD, GERD and carotid artery disease (s/p R CEA and followed by Vascular Surgery) who presents to the office today for 30-month follow-up.   He was last examined by Dr. Purvis Sheffield in 06/2019 for follow-up from a recent Emergency Department Visit for chest pain during which troponin values were negative and EKG showed NSR with RBBB and LAFB. He denied any recurrent episodes of chest pain since his visit and had been staying active [place]. It was recommended he have a Lexiscan Myoview for ischemic evaluation along with an echocardiogram given his cardiac murmur on examination. NST showed a medium defect thought to be consistent with soft tissue attenuation with scarring not ruled out. No large ischemic territories were noted and it was overall a low-risk study. Echocardiogram showed a preserved EF of 60-65% with moderate MR and moderate to severe aortic stenosis with mean gradient of 21.0, peak gradient of 36.4 mmHg and AVA 0.86cm^2.   In talking with the patient and his wife today, he reports overall doing well since his last office visit. He denies any recurrent episodes of chest pain. No recent dyspnea on exertion, orthopnea, PND or lower extremity edema. He denies any palpitations or presyncope. Does have occasional dizziness with positional changes and his wife is concerned he does not consume enough fluids.  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 He has continued to experience worsening hip pain and was evaluated by an Orthopedist in the past ans  told he was not a candidate for hip replacement given his advanced age. He is hoping to seek a second opinion in regards to this.   Past Medical History:  Diagnosis Date   Arthritis    Benign head tremor    Carotid artery occlusion    right side    Dysrhythmia    irregular heart beat per patient- skips a beat    Enlarged prostate    GERD (gastroesophageal reflux disease)    Hard of hearing    Hyperlipidemia    Sore throat     Past Surgical History:  Procedure Laterality Date   CATARACT EXTRACTION     left   ENDARTERECTOMY  11/03/2011   Procedure: ENDARTERECTOMY CAROTID;  Surgeon: Larina Earthly, MD;  Location: [facility] OR;  Service: Vascular;  Laterality: Right;  Right Carotid endarterectomy with patch angiopolasty   TONSILLECTOMY     TOTAL KNEE ARTHROPLASTY     left   TOTAL KNEE ARTHROPLASTY Right 09/21/2014   Procedure: RIGHT TOTAL KNEE ARTHROPLASTY;  Surgeon: Loanne Drilling, MD;  Location: [facility] ORS;  Service: Orthopedics;  Laterality: Right;    Current Medications: Outpatient Medications Prior to Visit  Medication Sig Dispense Refill   Apoaequorin (PREVAGEN EXTRA STRENGTH PO) Take 1 tablet by mouth every evening. Chewables     finasteride (PROSCAR) 5 MG tablet Take 5 mg by mouth at bedtime.      ipratropium (ATROVENT) 0.06 % nasal spray Place 2 sprays into both nostrils at bedtime.  omeprazole (PRILOSEC) 20 MG capsule Take 20 mg by mouth at bedtime.      simvastatin (ZOCOR) 80 MG tablet Take 40 mg by mouth at bedtime.      tamsulosin (FLOMAX) 0.4 MG CAPS capsule Take 0.8 mg by mouth every evening.     aspirin EC 81 MG tablet Take by mouth.     No facility-administered medications prior to visit.     Allergies:   Horse-derived products   Social History   Socioeconomic History   Marital status: Widowed    Spouse name: Not on file   Number of children: Not on file   Years of education: Not on file   Highest education level: Not on file    Occupational History   Not on file  Tobacco Use   Smoking status: Never Smoker   Smokeless tobacco: Never Used  Substance and Sexual Activity   Alcohol use: Yes    Comment: 1 glass of wine daily   Drug use: No   Sexual activity: Not on file  Other Topics Concern   Not on file  Social History Narrative   Not on file   Social Determinants of Health   Financial Resource Strain:    Difficulty of Paying Living Expenses: Not on file  Food Insecurity:    Worried About Running Out of Food in the Last Year: Not on file   Ran Out of Food in the Last Year: Not on file  Transportation Needs:    Lack of Transportation (Medical): Not on file   Lack of Transportation (Non-Medical): Not on file  Physical Activity:    Days of Exercise per Week: Not on file   Minutes of Exercise per Session: Not on file  Stress:    Feeling of Stress : Not on file  Social Connections:    Frequency of Communication with Friends and Family: Not on file   Frequency of Social Gatherings with Friends and Family: Not on file   Attends Religious Services: Not on file   Active Member of Clubs or Organizations: Not on file   Attends Banker Meetings: Not on file   Marital Status: Not on file     Family History:  The patient's family history includes Cancer in his brother and mother.   Review of Systems:   Please see the history of present illness.     General:  No chills, fever, night sweats or weight changes.  Cardiovascular:  No chest pain, dyspnea on exertion, edema, orthopnea, palpitations, paroxysmal nocturnal dyspnea. Dermatological: No rash, lesions/masses Respiratory: No cough, dyspnea MSK: Positive for joint pain.  Urologic: No hematuria, dysuria Abdominal:   No nausea, vomiting, diarrhea, bright red blood per rectum, melena, or hematemesis Neurologic:  No visual changes, wkns, changes in mental status.   All other systems reviewed and are otherwise negative  except as noted above.   Physical Exam:    VS:  BP 122/72    Pulse 79    Ht  (1.854 m)    Wt 185 lb (83.9 kg)    SpO2 95%    BMI 24.41 kg/m    General: Well developed, well nourished,male appearing in no acute distress. Head: Normocephalic, atraumatic, sclera non-icteric, no xanthomas, nares are without discharge.  Neck: No carotid bruits. JVD not elevated.  Lungs: Respirations regular and unlabored, without wheezes or rales.  Heart: Regular rate and rhythm. No S3 or S4.  No rubs or gallops appreciated. 2/6 SEM along RUSB.  Abdomen:  Soft, non-tender, non-distended with normoactive bowel sounds. No hepatomegaly. No rebound/guarding. No obvious abdominal masses. Msk:  Strength and tone appear normal for age. No joint deformities or effusions. Extremities: No clubbing or cyanosis. No lower extremity edema.  Distal pedal pulses are 2+ bilaterally. Neuro: Alert and oriented X 3. Moves all extremities spontaneously. No focal deficits noted. Psych:  Responds to questions appropriately with a normal affect. Skin: No rashes or lesions noted  Wt Readings from Last 3 Encounters:  10/07/19 185 lb (83.9 kg)  07/02/19 189 lb (85.7 kg)  06/18/19 189 lb 9.5 oz (86 kg)     Studies/Labs Reviewed:   EKG:  EKG is not ordered today.   Recent Labs: 06/18/2019: ALT 14; BUN 20; Creatinine, Ser 1.24; Hemoglobin 12.2; Platelets 176; Potassium 3.9; Sodium 136   Lipid Panel No results found for: CHOL, TRIG, HDL, CHOLHDL, VLDL, LDLCALC, LDLDIRECT  Additional studies/ records that were reviewed today include:   NST: 07/2019  There was no ST segment deviation noted during stress.  Defect 1: There is a medium defect of moderate severity present in the mid inferoseptal, mid inferior, mid inferolateral, apical inferior and apical lateral location. There is significant soft tissue attenuation. There may be a degree of myocardial scar but there are no large ischemic territories.  This is a low risk  study.  Nuclear stress EF: 52%.  Echocardiogram: 07/2019 IMPRESSIONS    1. Left ventricular ejection fraction, by visual estimation, is 60 to 65%. The left ventricle has normal function. There is mildly increased left ventricular hypertrophy.  2. Left ventricular diastolic Doppler parameters are consistent with impaired relaxation pattern of LV diastolic filling.  3. Global right ventricle has low normal systolic function.The right ventricular size is normal. No increase in right ventricular wall thickness.  4. Left atrial size was mildly dilated.  5. Right atrial size was mildly dilated.  6. Mild mitral annular calcification.  7. Moderate aortic valve annular calcification.  8. The mitral valve is degenerative. Moderate mitral valve regurgitation.  9. The tricuspid valve is grossly normal. Tricuspid valve regurgitation is mild. 10. The aortic valve is tricuspid Aortic valve regurgitation is mild by color flow Doppler. Moderate to severe aortic valve stenosis. 11. The pulmonic valve was not well visualized. Pulmonic valve regurgitation is not visualized by color flow Doppler. 12. Mildly elevated pulmonary artery systolic pressure. 13. The inferior vena cava is dilated in size with >50% respiratory variability, suggesting right atrial pressure of 8 mmHg.  Assessment:    1. Aortic valve stenosis, etiology of cardiac valve disease unspecified   2. Chest pain, unspecified type   3. Bilateral carotid artery stenosis   4. Hyperlipidemia LDL goal <70      Plan:   In order of problems listed above:  1. Aortic Stenosis - He was never told he had a cardiac murmur until his most recent visit in 06/2019.  Follow-up echocardiogram showed moderate to severe aortic stenosis with mean gradient of 21.0, peak gradient of 36.4 mmHg and AVA 0.86cm^2.  - I reviewed the progression of aortic stenosis with the patient and his wife today. Overall, he is asymptomatic at this time. Will plan to obtain a  repeat echocardiogram in 6 months for reassessment.  We did briefly discuss TAVR but this is not indicated at this time. Will follow AS with serial echocardiograms and refer to the Structural Heart Team if this progresses as he is very active at baseline.  - He is interested in having hip replacement  but has been declined by one orthopedist given his advanced age. He is planning to seek a second opinion. While he would be at moderate risk given his advanced age, I would not anticipate that his aortic stenosis would be prohibitive given that this is in a moderate to severe range but should be performed at a facility where the Cardiology service is available if needed. Would defer official clearance to his Primary Cardiologist.  2. Chest Pain - This occurred in 06/2019 with an ED evaluation showing no acute abnormalities. Recent NST showed a medium defect thought to be most consistent with soft tissue attenuation and no large ischemic territories were identified, overall being a low risk study. - He denies any recurrent episodes of chest pain over the past several months. Continue with risk factor modification. He remains on ASA and statin therapy.  3. Carotid Artery Stenosis - he is s/p R CEA and is followed by Vascular Surgery. Most recent carotid dopplers in 08/2018 showed less than 50% stenosis bilaterally. He remains on ASA and statin therapy.  4. HLD - Followed by PCP. Goal LDL is less than 70 in the setting of known carotid artery stenosis. He remains on Simvastatin 40 mg daily.  Medication Adjustments/Labs and Tests Ordered: Current medicines are reviewed at length with the patient today.  Concerns regarding medicines are outlined above.  Medication changes, Labs and Tests ordered today are listed in the Patient Instructions below. Patient Instructions  Medication Instructions:  Your physician recommends that you continue on your current medications as directed. Please refer to the Current  Medication list given to you today.  *If you need a refill on your cardiac medications before your next appointment, please call your pharmacy*  Lab Work: None today If you have labs (blood work) drawn today and your tests are completely normal, you will receive your results only by:  MyChart Message (if you have MyChart) OR  A paper copy in the mail If you have any lab test that is abnormal or we need to change your treatment, we will call you to review the results.  Testing/Procedures: Your physician has requested that you have an echocardiogram in 6 months. Echocardiography is a painless test that uses sound waves to create images of your heart. It provides your doctor with information about the size and shape of your heart and how well your hearts chambers and valves are working. This procedure takes approximately one hour. There are no restrictions for this procedure. Follow-Up: At [facility], you and your health needs are our priority.  As part of our continuing mission to provide you with exceptional heart care, we have created designated Provider Care Teams.  These Care Teams include your primary Cardiologist (physician) and Advanced Practice Providers (APPs -  Physician Assistants and Nurse Practitioners) who all work together to provide you with the care you need, when you need it.  Your next appointment:   7 month(s)  The format for your next appointment:   In Person  Provider:   Prentice DockerSuresh Koneswaran, MD  Other Instructions None   Thank you for choosing [facility] !          Signed, Ellsworth LennoxBrittany M Bhavesh Vazquez, PA-C  10/07/2019 5:53 PM    [facility] [address] [place], [place] [address] Phone: [phone] Fax: [phone]

## 2019-10-15 ENCOUNTER — Ambulatory Visit (INDEPENDENT_AMBULATORY_CARE_PROVIDER_SITE_OTHER): Payer: Medicare Other | Admitting: Orthopaedic Surgery

## 2019-10-15 ENCOUNTER — Ambulatory Visit (INDEPENDENT_AMBULATORY_CARE_PROVIDER_SITE_OTHER): Payer: Medicare Other

## 2019-10-15 ENCOUNTER — Encounter: Payer: Self-pay | Admitting: Orthopaedic Surgery

## 2019-10-15 ENCOUNTER — Other Ambulatory Visit: Payer: Self-pay

## 2019-10-15 VITALS — Ht 73.0 in | Wt 185.0 lb

## 2019-10-15 DIAGNOSIS — M7061 Trochanteric bursitis, right hip: Secondary | ICD-10-CM | POA: Diagnosis not present

## 2019-10-15 DIAGNOSIS — M25551 Pain in right hip: Secondary | ICD-10-CM

## 2019-10-15 MED ORDER — METHYLPREDNISOLONE ACETATE 40 MG/ML IJ SUSP
40.0000 mg | INTRAMUSCULAR | Status: AC | PRN
  Administered 2019-10-15: 40 mg via INTRA_ARTICULAR

## 2019-10-15 MED ORDER — LIDOCAINE HCL 1 % IJ SOLN
3.0000 mL | INTRAMUSCULAR | Status: AC | PRN
  Administered 2019-10-15: 3 mL

## 2019-10-15 NOTE — Progress Notes (Signed)
Office Visit Note   Patient: Jeffery Smith           Date of Birth: August 27, 1925           MRN: [id number] Visit Date: 10/15/2019              Requested by: Myrla Halsted, MD [facility] [place] [address] PCP: Myrla Halsted, MD   Assessment & Plan: Visit Diagnoses:  1. Pain in right hip   2. Trochanteric bursitis, right hip     Plan: His clinical exam as well as signs and symptoms is consistent with trochanteric bursitis and IT band syndrome.  I have recommended a steroid injection of this area and he agrees with this.  I explained the risk and benefits of injections and he tolerated it well.  Also showed him stretching exercises to try which he demonstrated back to me.  I will have him pick up Voltaren gel as a topical anti-inflammatory to rub on his hips twice daily over the trochanteric area.  All question concerns were answered and addressed.  We can always repeat injections at 3 months if needed.  Follow-up as otherwise as needed.  Follow-Up Instructions: Return if symptoms worsen or fail to improve.   Orders:  Orders Placed This Encounter  Procedures  . Large Joint Inj  . XR HIP UNILAT W OR W/O PELVIS 1V RIGHT   No orders of the defined types were placed in this encounter.     Procedures: Large Joint Inj: R greater trochanter on 10/15/2019 9:48 AM Indications: pain and diagnostic evaluation Details: 22 G 1.5 in needle, lateral approach  Arthrogram: No  Medications: 3 mL lidocaine 1 %; 40 mg methylPREDNISolone acetate 40 MG/ML Outcome: tolerated well, no immediate complications Procedure, treatment alternatives, risks and benefits explained, specific risks discussed. Consent was given by the patient. Immediately prior to procedure a time out was called to verify the correct patient, procedure, equipment, support staff and site/side marked as required. Patient was prepped and draped in the usual sterile fashion.       Clinical Data: No  additional findings.   Subjective: No chief complaint on file. Patient is a very pleasant 86 year old gentleman who comes in with right hip pain is been going on for several years.  He denies any groin pain but he is having difficulty walking.  He is a frail 86 year old gentleman.  He points to the lateral aspect of his right hip as a source of his pain.  His wife was on the speaker phone while we talked because he is hard of hearing.  He says is more of an annoying type of pain.  He is never injured this before and has not had any falls.  Again he denies groin pain.  He has had no other acute medical issues right now.  He does have a history of coronary disease in the past.  HPI  Review of Systems He currently denies any headache, chest pain, shortness of breath, fever, chills, nausea, vomiting  Objective: Vital Signs: Ht 6\' 1"  (1.854 m)   Wt 185 lb (83.9 kg)   BMI 24.41 kg/m   Physical Exam He is alert and orient x3 and in no acute distress Ortho Exam Examination of his hips show that they both move smoothly and fluidly with full internal and external rotation.  His pain is only to palpation of the trochanteric area of both hips more on the right than the left. Specialty Comments:  No specialty comments available.  Imaging: XR HIP UNILAT W OR W/O PELVIS 1V RIGHT  Result Date: 10/15/2019 An AP pelvis lateral right hip shows mild arthritic findings of the joint itself.  There is chronic changes around the trochanteric area more on the right than the left.    PMFS History: Patient Active Problem List   Diagnosis Date Noted  . OA (osteoarthritis) of knee 09/21/2014  . Occlusion and stenosis of carotid artery without mention of cerebral infarction 11/21/2011   Past Medical History:  Diagnosis Date  . Arthritis   . Benign head tremor   . Carotid artery occlusion    right side   . Dysrhythmia    irregular heart beat per patient- skips a beat   . Enlarged prostate   . GERD  (gastroesophageal reflux disease)   . Hard of hearing   . Hyperlipidemia   . Sore throat     Family History  Problem Relation Age of Onset  . Cancer Mother   . Cancer Brother     Past Surgical History:  Procedure Laterality Date  . CATARACT EXTRACTION     left  . ENDARTERECTOMY  11/03/2011   Procedure: ENDARTERECTOMY CAROTID;  Surgeon: Larina Earthly, MD;  Location: [facility] OR;  Service: Vascular;  Laterality: Right;  Right Carotid endarterectomy with patch angiopolasty  . TONSILLECTOMY    . TOTAL KNEE ARTHROPLASTY     left  . TOTAL KNEE ARTHROPLASTY Right 09/21/2014   Procedure: RIGHT TOTAL KNEE ARTHROPLASTY;  Surgeon: Loanne Drilling, MD;  Location: [facility];  Service: Orthopedics;  Laterality: Right;   Social History   Occupational History  . Not on file  Tobacco Use  . Smoking status: Never Smoker  . Smokeless tobacco: Never Used  Substance and Sexual Activity  . Alcohol use: Yes    Comment: 1 glass of wine daily  . Drug use: No  . Sexual activity: Not on file

## 2019-11-04 ENCOUNTER — Encounter: Payer: Self-pay | Admitting: Urology

## 2019-11-04 ENCOUNTER — Other Ambulatory Visit: Payer: Self-pay

## 2019-11-04 ENCOUNTER — Ambulatory Visit (INDEPENDENT_AMBULATORY_CARE_PROVIDER_SITE_OTHER): Payer: Medicare Other | Admitting: Urology

## 2019-11-04 VITALS — BP 113/64 | HR 40 | Temp 96.4°F | Ht 72.0 in | Wt 180.0 lb

## 2019-11-04 DIAGNOSIS — R972 Elevated prostate specific antigen [PSA]: Secondary | ICD-10-CM | POA: Diagnosis not present

## 2019-11-04 DIAGNOSIS — N400 Enlarged prostate without lower urinary tract symptoms: Secondary | ICD-10-CM | POA: Diagnosis not present

## 2019-11-04 LAB — POCT URINALYSIS DIPSTICK
Bilirubin, UA: NEGATIVE
Blood, UA: NEGATIVE
Glucose, UA: NEGATIVE
Ketones, UA: NEGATIVE
Leukocytes, UA: NEGATIVE
Nitrite, UA: NEGATIVE
Protein, UA: NEGATIVE
Spec Grav, UA: 1.02 (ref 1.010–1.025)
Urobilinogen, UA: NEGATIVE E.U./dL — AB
pH, UA: 5 (ref 5.0–8.0)

## 2019-11-04 LAB — BLADDER SCAN AMB NON-IMAGING: Scan Result: 30.8

## 2019-11-04 MED ORDER — SILDENAFIL CITRATE 100 MG PO TABS: 100.0000 mg | ORAL_TABLET | Freq: Every day | ORAL | 11 refills | Status: DC | PRN

## 2019-11-04 NOTE — Progress Notes (Addendum)
H&P  Chief Complaint: Prostate Check and ED  History of Present Illness:   1.26.2021: Jeffery Smith is a 86 yo male presenting today for prostate check. We do not currently have access to any records on him relevant to this referral, and he fairly significant issues reporting his hx as he is severely hard of hearing and has some cognitive difficulties with memory. He states that he is here today in part because he is worried he has "prostate problem" as he is now having 1-2x nocturia which is atypical for him (only developed in the last year). His only other urinary issue is that he feels as though he has to double void where he could used to be able to empty his bladder fully upon first void. He has never been seen by a urologist, but has been on 2x tamsulosin daily (both in morning now) per [place] [facility] -- he cannot recall the name of his physician at the [place] but knows that it is a male doctor who is the head of group 1. He does note some issues with dizziness. Overall, he is tolerating his urinary sx's very well.   Additionally, he is complaining of ED which has only been an issue in the last 2-3 years. He did admit towards the end of the visit that his ED is the primary reason for his visit. He has never tried medications for this issue, and even states he has never heard of viagra. He is still sexually active/interested in remaining sexually active so this is a significant issue for him that he would like to try medication for.  IPSS Questionnaire (AUA-7): Over the past month.   1)  How often have you had a sensation of not emptying your bladder completely after you finish urinating?  2 - Less than half the time  2)  How often have you had to urinate again less than two hours after you finished urinating? 2 - Less than half the time  3)  How often have you found you stopped and started again several times when you urinated?  0 - Not at all  4) How difficult have you found it to postpone  urination?  0 - Not at all  5) How often have you had a weak urinary stream?  1 - Less than 1 time in 5  6) How often have you had to push or strain to begin urination?  0 - Not at all  7) How many times did you most typically get up to urinate from the time you went to bed until the time you got up in the morning?  2 - 2 times  Total score:  0-7 mildly symptomatic   8-19 moderately symptomatic   20-35 severely symptomatic   IPSS Score: 7 QoL Score: 2 (mostly satisfied)  Past Medical History:  Diagnosis Date  . Arthritis   . Benign head tremor   . Carotid artery occlusion    right side   . Dysrhythmia    irregular heart beat per patient- skips a beat   . Enlarged prostate   . GERD (gastroesophageal reflux disease)   . Hard of hearing   . Hyperlipidemia   . Sore throat     Past Surgical History:  Procedure Laterality Date  . CATARACT EXTRACTION     left  . ENDARTERECTOMY  11/03/2011   Procedure: ENDARTERECTOMY CAROTID;  Surgeon: Larina Earthly, MD;  Location: [facility] OR;  Service: Vascular;  Laterality: Right;  Right Carotid  endarterectomy with patch angiopolasty  . TONSILLECTOMY    . TOTAL KNEE ARTHROPLASTY     left  . TOTAL KNEE ARTHROPLASTY Right 09/21/2014   Procedure: RIGHT TOTAL KNEE ARTHROPLASTY;  Surgeon: Loanne Drilling, MD;  Location: [facility] ORS;  Service: Orthopedics;  Laterality: Right;    Home Medications:  Allergies as of 11/04/2019       Reactions   Horse-derived Products Anaphylaxis        Medication List        Accurate as of November 04, 2019  9:24 AM. If you have any questions, ask your nurse or doctor.          aspirin EC 81 MG tablet Take 1 tablet (81 mg total) by mouth daily.   finasteride 5 MG tablet Commonly known as: PROSCAR Take 5 mg by mouth at bedtime.   ipratropium 0.06 % nasal spray Commonly known as: ATROVENT Place 2 sprays into both nostrils at bedtime.   omeprazole 20 MG capsule Commonly known as: PRILOSEC Take 20 mg by mouth at  bedtime.   PREVAGEN EXTRA STRENGTH PO Take 1 tablet by mouth every evening. Chewables   simvastatin 80 MG tablet Commonly known as: ZOCOR Take 40 mg by mouth at bedtime.   tamsulosin 0.4 MG Caps capsule Commonly known as: FLOMAX Take 0.8 mg by mouth every evening.        Allergies:  Allergies  Allergen Reactions  . Horse-Derived Products Anaphylaxis    Family History  Problem Relation Age of Onset  . Cancer Mother   . Cancer Brother     Social History:  reports that he has never smoked. He has never used smokeless tobacco. He reports current alcohol use. He reports that he does not use drugs.  ROS: A complete review of systems was performed.  All systems are negative except for pertinent findings as noted.  Physical Exam:  Vital signs in last 24 hours: BP 113/64   Pulse (!) 40   Temp (!) 96.4 F (35.8 C)   Ht 6' (1.829 m)   Wt 180 lb (81.6 kg)   BMI 24.41 kg/m  Constitutional:  Alert and oriented, No acute distress Cardiovascular: Regular rate  Respiratory: Normal respiratory effort GI: Abdomen is soft, nontender, nondistended, no abdominal masses. No CVAT.  Genitourinary: Normal male phallus (uncircumcised), testes are descended bilaterally and non-tender and without masses and atrophic, scrotum is normal in appearance without lesions or masses, perineum is normal on inspection. Prostate feels around 40 grams in size with no abnormalities.  Lymphatic: No lymphadenopathy Neurologic: Grossly intact, no focal deficits Psychiatric: Normal mood and affect  Laboratory Data:  No results for input(s): WBC, HGB, HCT, PLT in the last 72 hours.  No results for input(s): NA, K, CL, GLUCOSE, BUN, CALCIUM, CREATININE in the last 72 hours.  Invalid input(s): CO3   Results for orders placed or performed in visit on 11/04/19 (from the past 24 hour(s))  POCT urinalysis dipstick     Status: Abnormal   Collection Time: 11/04/19  9:03 AM  Result Value Ref Range   Color,  UA yellow    Clarity, UA     Glucose, UA Negative Negative   Bilirubin, UA neg    Ketones, UA neg    Spec Grav, UA 1.020 1.010 - 1.025   Blood, UA neg    pH, UA 5.0 5.0 - 8.0   Protein, UA Negative Negative   Urobilinogen, UA negative (A) 0.2 or 1.0 E.U./dL   Nitrite,  UA neg    Leukocytes, UA Negative Negative   Appearance clear    Odor     No results found for this or any previous visit (from the past 240 hour(s)).  Renal Function: No results for input(s): CREATININE in the last 168 hours. CrCl cannot be calculated (Patient's most recent lab result is older than the maximum 21 days allowed.).  Radiologic Imaging: No results found.  Impression/Assessment:  He is interested in starting on viagra after being informed about its use. I think it should be relatively safe for him as he has no issues with chest pain and does not regularly take nitro.  He is already on 2x tamsulosin that he is taking each morning -- his urinary sx's are very mild and are seemingly quite tolerable for him. Given he is having issues with dizziness and balance, he should try tapering down to just one tamsulosin daily.   Plan:  1. Advised to taper down to taking tamsulosin just 1x daily.   2. Rx given for sildenafil. SE's disccussed  3. Return PRN

## 2019-12-25 ENCOUNTER — Emergency Department (HOSPITAL_COMMUNITY): Payer: No Typology Code available for payment source

## 2019-12-25 ENCOUNTER — Emergency Department (HOSPITAL_COMMUNITY)
Admission: EM | Admit: 2019-12-25 | Discharge: 2019-12-25 | Disposition: A | Discharge status: Home / Self Care | Payer: No Typology Code available for payment source | Attending: Emergency Medicine | Admitting: Emergency Medicine

## 2019-12-25 ENCOUNTER — Other Ambulatory Visit: Payer: Self-pay

## 2019-12-25 ENCOUNTER — Encounter (HOSPITAL_COMMUNITY): Payer: Self-pay | Admitting: Emergency Medicine

## 2019-12-25 DIAGNOSIS — Z96653 Presence of artificial knee joint, bilateral: Secondary | ICD-10-CM | POA: Insufficient documentation

## 2019-12-25 DIAGNOSIS — M25472 Effusion, left ankle: Secondary | ICD-10-CM | POA: Diagnosis not present

## 2019-12-25 DIAGNOSIS — M25572 Pain in left ankle and joints of left foot: Secondary | ICD-10-CM | POA: Insufficient documentation

## 2019-12-25 DIAGNOSIS — Z79899 Other long term (current) drug therapy: Secondary | ICD-10-CM | POA: Insufficient documentation

## 2019-12-25 DIAGNOSIS — Z7982 Long term (current) use of aspirin: Secondary | ICD-10-CM | POA: Diagnosis not present

## 2019-12-25 DIAGNOSIS — R2242 Localized swelling, mass and lump, left lower limb: Secondary | ICD-10-CM | POA: Diagnosis present

## 2019-12-25 NOTE — ED Provider Notes (Signed)
[facility] EMERGENCY DEPARTMENT Provider Note   CSN: [id number] Arrival date & time: 12/25/19  1159     History Chief Complaint  Patient presents with  . Leg Pain    Jeffery Smith is a 86 y.o. male history of GERD, hyperlipidemia, enlarged prostate, arthritis.  Patient presents today from urgent care following concern of possible DVT of the left lower extremity.  Patient reports that he has noticed mild left leg swelling for approximately 1 year, and has not worsened in that time per patient.  He reports that when he woke up today he developed medial left ankle pain radiating down to his foot, a moderate intensity aching sensation constant worsened with ambulation improved with rest.  He reports that pain has since resolved.  He denies any injury or trauma of the area reports that he noticed some bruising along his medial ankle today when he woke up as well.  He reports that urgent care was concerned for a blood clot and sent him here for an ultrasound.  Denies fever/chills, fall/injury, numbness/tingling, weakness, chest pain/shortness of breath or any additional concerns.  HPI     Past Medical History:  Diagnosis Date  . Arthritis   . Benign head tremor   . Carotid artery occlusion    right side   . Dysrhythmia    irregular heart beat per patient- skips a beat   . Enlarged prostate   . GERD (gastroesophageal reflux disease)   . Hard of hearing   . Hyperlipidemia   . Sore throat     Patient Active Problem List   Diagnosis Date Noted  . OA (osteoarthritis) of knee 09/21/2014  . Occlusion and stenosis of carotid artery without mention of cerebral infarction 11/21/2011    Past Surgical History:  Procedure Laterality Date  . CATARACT EXTRACTION     left  . ENDARTERECTOMY  11/03/2011   Procedure: ENDARTERECTOMY CAROTID;  Surgeon: Rosetta Posner, MD;  Location: [facility] OR;  Service: Vascular;  Laterality: Right;  Right Carotid endarterectomy with patch angiopolasty  .  TONSILLECTOMY    . TOTAL KNEE ARTHROPLASTY     left  . TOTAL KNEE ARTHROPLASTY Right 09/21/2014   Procedure: RIGHT TOTAL KNEE ARTHROPLASTY;  Surgeon: Gearlean Alf, MD;  Location: [facility] ORS;  Service: Orthopedics;  Laterality: Right;       Family History  Problem Relation Age of Onset  . Cancer Mother   . Cancer Brother     Social History   Tobacco Use  . Smoking status: Never Smoker  . Smokeless tobacco: Never Used  Substance Use Topics  . Alcohol use: Yes    Comment: 1 glass of wine daily  . Drug use: No    Home Medications Prior to Admission medications   Medication Sig Start Date End Date Taking? Authorizing Provider  Apoaequorin (PREVAGEN EXTRA STRENGTH PO) Take 1 tablet by mouth every evening. Chewables    [provider]  aspirin EC 81 MG tablet Take 1 tablet (81 mg total) by mouth daily. 10/07/19   Strader, Fransisco Hertz, PA-C  finasteride (PROSCAR) 5 MG tablet Take 5 mg by mouth at bedtime.     [provider]  ipratropium (ATROVENT) 0.06 % nasal spray Place 2 sprays into both nostrils at bedtime.    [provider]  omeprazole (PRILOSEC) 20 MG capsule Take 20 mg by mouth at bedtime.     [provider]  sildenafil (VIAGRA) 100 MG tablet Take 1 tablet (100 mg total)  by mouth daily as needed for erectile dysfunction. 11/04/19   Marcine Matar, MD  simvastatin (ZOCOR) 80 MG tablet Take 40 mg by mouth at bedtime.     [provider]  tamsulosin (FLOMAX) 0.4 MG CAPS capsule Take 0.8 mg by mouth every evening.    [provider]    Allergies    Horse-derived products  Review of Systems   Review of Systems Ten systems are reviewed and are negative for acute change except as noted in the HPI  Physical Exam Updated Vital Signs BP (!) 138/99 (BP Location: Right Arm)   Pulse 74   Temp 98.2 F (36.8 C) (Oral)   Resp 17   Ht 6' (1.829 m)   Wt 77.1 kg   SpO2 99%   BMI 23.06 kg/m   Physical Exam Constitutional:       General: He is not in acute distress.    Appearance: Normal appearance. He is well-developed. He is not ill-appearing or diaphoretic.  HENT:     Head: Normocephalic and atraumatic.     Right Ear: External ear normal.     Left Ear: External ear normal.     Nose: Nose normal.  Eyes:     General: Vision grossly intact. Gaze aligned appropriately.     Pupils: Pupils are equal, round, and reactive to light.  Neck:     Trachea: Trachea and phonation normal. No tracheal deviation.  Cardiovascular:     Rate and Rhythm: Normal rate and regular rhythm.     Pulses: Normal pulses.  Pulmonary:     Effort: Pulmonary effort is normal. No respiratory distress.  Abdominal:     General: There is no distension.     Palpations: Abdomen is soft.     Tenderness: There is no abdominal tenderness. There is no guarding or rebound.  Musculoskeletal:        General: Normal range of motion.     Cervical back: Normal range of motion.     Comments: Moderate swelling along the medial left ankle, tenderness at the medial malleolus without deformity or skin break.  Multiple bruises present.  Capillary refill and sensation intact to all toes, pedal pulses present and equal.  Compartments soft.  Range of motion at the knee and hip intact without pain.  Skin:    General: Skin is warm and dry.  Neurological:     Mental Status: He is alert.     GCS: GCS eye subscore is 4. GCS verbal subscore is 5. GCS motor subscore is 6.     Comments: Speech is clear and goal oriented, follows commands Major Cranial nerves without deficit, no facial droop Moves extremities without ataxia, coordination intact  Psychiatric:        Behavior: Behavior normal.     ED Results / Procedures / Treatments   Labs (all labs ordered are listed, but only abnormal results are displayed) Labs Reviewed - No data to display  EKG None  Radiology DG Ankle Complete Left  Result Date: 12/25/2019 CLINICAL DATA:  Pain and swelling EXAM:  LEFT ANKLE COMPLETE - 3+ VIEW COMPARISON:  November 08, 2017 FINDINGS: Frontal, oblique, and lateral views were obtained. There is soft tissue swelling. No evident fracture or joint effusion. There is mild narrowing medially and laterally in the ankle joint region. No erosive change. There is an inferior calcaneal spur. The ankle mortise appears intact. IMPRESSION: Soft tissue swelling. Osteoarthritic change medially and laterally. Inferior calcaneal spur. No evident fracture. Ankle mortise  appears intact. Electronically Signed   By: Bretta Bang III M.D.   On: 12/25/2019 13:03   US Venous Img Lower  Left (DVT Study)  Result Date: 12/25/2019 CLINICAL DATA:  Left lower extremity pain and swelling. EXAM: LEFT LOWER EXTREMITY VENOUS DOPPLER ULTRASOUND TECHNIQUE: Gray-scale sonography with compression, as well as color and duplex ultrasound, were performed to evaluate the deep venous system(s) from the level of the common femoral vein through the popliteal and proximal calf veins. COMPARISON:  None. FINDINGS: VENOUS Normal compressibility of the common femoral, superficial femoral, and popliteal veins, as well as the visualized calf veins. Visualized portions of profunda femoral vein and great saphenous vein unremarkable. No filling defects to suggest DVT on grayscale or color Doppler imaging. Doppler waveforms show normal direction of venous flow, normal respiratory phasicity and response to augmentation. Limited views of the contralateral common femoral vein are unremarkable. OTHER Soft tissue edema of the left calf extending to the ankle. Limitations: none IMPRESSION: No femoropopliteal DVT nor evidence of DVT within the visualized calf veins. If clinical symptoms are inconsistent or if there are persistent or worsening symptoms, further imaging (possibly involving the iliac veins) may be warranted. Electronically Signed   By: Amie Portland M.D.   On: 12/25/2019 13:44   DG Foot Complete Left  Result Date:  12/25/2019 CLINICAL DATA:  Pain and swelling EXAM: LEFT FOOT - COMPLETE 3+ VIEW COMPARISON:  None. FINDINGS: Frontal, oblique, and lateral views were obtained. Bones appear osteoporotic. There is no appreciable fracture or dislocation. There is marked osteoarthritic change in the first MTP joint with severe joint space narrowing and diffuse bony overgrowth. There is much milder osteoarthritic change in the second PIP joint and first IP joint regions. No erosive changes. There is an inferior calcaneal spur. IMPRESSION: Bones osteoporotic. No fracture or dislocation. Marked osteoarthritic change at the first MTP joint with severe narrowing and diffuse bony overgrowth. Milder osteoarthritic change in the second PIP in first IP joint regions. Electronically Signed   By: Bretta Bang III M.D.   On: 12/25/2019 13:07    Procedures Procedures (including critical care time)  Medications Ordered in ED Medications - No data to display  ED Course  I have reviewed the triage vital signs and the nursing notes.  Pertinent labs & imaging results that were available during my care of the patient were reviewed by me and considered in my medical decision making (see chart for details).    MDM Rules/Calculators/A&P                     86 year old male presents today for pain and swelling around the left medial ankle.  Swelling has been present for approximately 1 year however pain just began this morning per patient, no known trauma to the area.  Capillary refill sensation and pedal pulses intact bilaterally.  He has tenderness along the medial malleolus and some associated swelling and contusions present.  Patient was sent here for DVT study today.  Will obtain DVT ultrasound of the left lower extremity, additionally suspicion for possible soft tissue injury strain versus sprain given bruising around the area, will obtain x-ray of the ankle and foot. - DG left ankle:  IMPRESSION:  Soft tissue swelling.  Osteoarthritic change medially and laterally.  Inferior calcaneal spur. No evident fracture. Ankle mortise appears  intact.   DG Left Foot:  IMPRESSION:  Bones osteoporotic. No fracture or dislocation. Marked  osteoarthritic change at the first MTP joint with severe  narrowing  and diffuse bony overgrowth. Milder osteoarthritic change in the  second PIP in first IP joint regions.   DVT US LLE:    IMPRESSION:  No femoropopliteal DVT nor evidence of DVT within the visualized  calf veins.    If clinical symptoms are inconsistent or if there are persistent or  worsening symptoms, further imaging (possibly involving the iliac  veins) may be warranted.  - Patient has no swelling involving the upper portion of the left calf, knee or thigh.  Do not feel that symptoms clinically are consistent with an iliac DVT at this time.  Suspect patient with soft tissue sprain possibly of the deltoid ligament as cause of swelling and bruising of the area.  Will place patient in an air cast and referred to orthopedics and to the [place] for follow-up.  Patient will use rice therapy at home and return for any new or worsening symptoms.  No evidence of DVT, compartment syndrome, cellulitis, septic arthritis or other emergent pathologies. - Patient reassessed resting comfortably in bed no acute distress.  His wife is now in the room as well, they have been updated on findings as above and states understanding.  Patient's wife corroborates story given by patient, she reports that pain began today and then it resolved by the time she got to his house to see him.  They state understanding of care plan and they have no questions or concerns.  At this time there does not appear to be any evidence of an acute emergency medical condition and the patient appears stable for discharge with appropriate outpatient follow up. Diagnosis was discussed with patient who verbalizes understanding of care plan and is agreeable to discharge.  I have discussed return precautions with patient and wife who verbalizes understanding of return precautions. Patient encouraged to follow-up with their PCP. All questions answered.  Patient seen and evaluated by Dr. Adriana Simas during this visit who agrees with work-up and discharge with outpatient follow-up.  Note: Portions of this report may have been transcribed using voice recognition software. Every effort was made to ensure accuracy; however, inadvertent computerized transcription errors may still be present. Final Clinical Impression(s) / ED Diagnoses Final diagnoses:  Acute left ankle pain  Left ankle swelling    Rx / DC Orders ED Discharge Orders    None       Elizabeth Palau 12/25/19 1414    Donnetta Hutching, MD 12/28/19 [phone]

## 2019-12-25 NOTE — Discharge Instructions (Signed)
You have been diagnosed today with Left Ankle Pain and Swelling.  At this time there does not appear to be the presence of an emergent medical condition, however there is always the potential for conditions to change. Please read and follow the below instructions.  Please return to the Emergency Department immediately for any new or worsening symptoms. Please be sure to follow up with your Primary Care Provider within one week regarding your visit today; please call their office to schedule an appointment even if you are feeling better for a follow-up visit. You may follow-up with the orthopedic specialist Dr. Romeo Apple on your discharge paperwork for further evaluation of your left ankle pain and swelling.  Get help right away if: Your foot, leg, toes, or ankle: Tingles or becomes numb. Becomes swollen. Turns pale or blue. You swelling gets worse or extends up your leg You have chest pain or trouble breathing. You have any new/concerning or worsening of symptoms  Please read the additional information packets attached to your discharge summary.  Do not take your medicine if  develop an itchy rash, swelling in your mouth or lips, or difficulty breathing; call 911 and seek immediate emergency medical attention if this occurs.  Note: Portions of this text may have been transcribed using voice recognition software. Every effort was made to ensure accuracy; however, inadvertent computerized transcription errors may still be present.

## 2019-12-25 NOTE — ED Triage Notes (Signed)
Pt went to [facility] for left leg pain and swelling. Was told to come to ED for rule DVT.

## 2020-02-11 ENCOUNTER — Other Ambulatory Visit: Payer: Self-pay

## 2020-02-11 ENCOUNTER — Encounter: Payer: Self-pay | Admitting: Cardiovascular Disease

## 2020-02-11 ENCOUNTER — Ambulatory Visit (INDEPENDENT_AMBULATORY_CARE_PROVIDER_SITE_OTHER): Payer: Medicare Other | Admitting: Cardiovascular Disease

## 2020-02-11 VITALS — BP 128/70 | HR 74 | Ht 73.0 in | Wt 181.2 lb

## 2020-02-11 DIAGNOSIS — R600 Localized edema: Secondary | ICD-10-CM

## 2020-02-11 DIAGNOSIS — I6523 Occlusion and stenosis of bilateral carotid arteries: Secondary | ICD-10-CM | POA: Diagnosis not present

## 2020-02-11 DIAGNOSIS — E785 Hyperlipidemia, unspecified: Secondary | ICD-10-CM

## 2020-02-11 DIAGNOSIS — I350 Nonrheumatic aortic (valve) stenosis: Secondary | ICD-10-CM | POA: Diagnosis not present

## 2020-02-11 MED ORDER — FUROSEMIDE 20 MG PO TABS: 20.0000 mg | ORAL_TABLET | ORAL | 6 refills | Status: DC | PRN

## 2020-02-11 NOTE — Patient Instructions (Signed)
Medication Instructions:   Begin Lasix 20mg  as needed for leg edema.   Continue all other medications.    Labwork: none  Testing/Procedures:  Your physician has requested that you have an echocardiogram. Echocardiography is a painless test that uses sound waves to create images of your heart. It provides your doctor with information about the size and shape of your heart and how well your heart's chambers and valves are working. This procedure takes approximately one hour. There are no restrictions for this procedure.  Office will contact with results via phone or letter.    Follow-Up: 6 months   Any Other Special Instructions Will Be Listed Below (If Applicable).  If you need a refill on your cardiac medications before your next appointment, please call your pharmacy.\

## 2020-02-11 NOTE — Progress Notes (Signed)
SUBJECTIVE: The patient presents for follow-up of aortic stenosis.  His chief complaint today relates to bilateral leg swelling.  This has been going on for a few months.  He said it is not particularly painful.  He underwent lower extremity Dopplers in March 2020 one of the left lower extremity which were negative for DVT.  He denies chest pain, palpitations, shortness of breath, dizziness, syncope, orthopnea, and paroxysmal nocturnal dyspnea.  Echocardiogram on 07/16/2019 demonstrated normal LV systolic function, EF 60 to 65%, grade 1 diastolic dysfunction, moderate mitral vegetation, and moderate to severe aortic stenosis.  There is also mild aortic and tricuspid regurgitation.    Review of Systems: As per "subjective", otherwise negative.  Allergies  Allergen Reactions  . Horse-Derived Products Anaphylaxis    Current Outpatient Medications  Medication Sig Dispense Refill  . Apoaequorin (PREVAGEN EXTRA STRENGTH PO) Take 1 tablet by mouth every evening. Chewables    . aspirin EC 81 MG tablet Take 1 tablet (81 mg total) by mouth daily.    . finasteride (PROSCAR) 5 MG tablet Take 5 mg by mouth at bedtime.     Marland Kitchen ipratropium (ATROVENT) 0.06 % nasal spray Place 2 sprays into both nostrils at bedtime.    Marland Kitchen omeprazole (PRILOSEC) 20 MG capsule Take 20 mg by mouth at bedtime.     . sildenafil (VIAGRA) 100 MG tablet Take 1 tablet (100 mg total) by mouth daily as needed for erectile dysfunction. 10 tablet 11  . simvastatin (ZOCOR) 80 MG tablet Take 40 mg by mouth at bedtime.     . tamsulosin (FLOMAX) 0.4 MG CAPS capsule Take 0.8 mg by mouth every evening.    . furosemide (LASIX) 20 MG tablet Take 1 tablet (20 mg total) by mouth as needed for edema (leg swelling). 30 tablet 6   No current facility-administered medications for this visit.    Past Medical History:  Diagnosis Date  . Arthritis   . Benign head tremor   . Carotid artery occlusion    right side   . Dysrhythmia    irregular heart beat per patient- skips a beat   . Enlarged prostate   . GERD (gastroesophageal reflux disease)   . Hard of hearing   . Hyperlipidemia   . Sore throat     Past Surgical History:  Procedure Laterality Date  . CATARACT EXTRACTION     left  . ENDARTERECTOMY  11/03/2011   Procedure: ENDARTERECTOMY CAROTID;  Surgeon: Larina Earthly, MD;  Location: [facility] OR;  Service: Vascular;  Laterality: Right;  Right Carotid endarterectomy with patch angiopolasty  . TONSILLECTOMY    . TOTAL KNEE ARTHROPLASTY     left  . TOTAL KNEE ARTHROPLASTY Right 09/21/2014   Procedure: RIGHT TOTAL KNEE ARTHROPLASTY;  Surgeon: Loanne Drilling, MD;  Location: [facility] ORS;  Service: Orthopedics;  Laterality: Right;    Social History   Socioeconomic History  . Marital status: Married    Spouse name: Not on file  . Number of children: Not on file  . Years of education: Not on file  . Highest education level: Not on file  Occupational History  . Not on file  Tobacco Use  . Smoking status: Never Smoker  . Smokeless tobacco: Never Used  Substance and Sexual Activity  . Alcohol use: Yes    Comment: 1 glass of wine daily  . Drug use: No  . Sexual activity: Not on file  Other Topics Concern  . Not on file  Social History Narrative  . Not on file   Social Determinants of Health   Financial Resource Strain:   . Difficulty of Paying Living Expenses:   Food Insecurity:   . Worried About Charity fundraiser in the Last Year:   . Arboriculturist in the Last Year:   Transportation Needs:   . Film/video editor (Medical):   Marland Kitchen Lack of Transportation (Non-Medical):   Physical Activity:   . Days of Exercise per Week:   . Minutes of Exercise per Session:   Stress:   . Feeling of Stress :   Social Connections:   . Frequency of Communication with Friends and Family:   . Frequency of Social Gatherings with Friends and Family:   . Attends Religious Services:   . Active Member of Clubs or Organizations:    . Attends Archivist Meetings:   Marland Kitchen Marital Status:   Intimate Partner Violence:   . Fear of Current or Ex-Partner:   . Emotionally Abused:   Marland Kitchen Physically Abused:   . Sexually Abused:       Vitals:   02/11/20 0817  BP: 128/70  Pulse: 74  SpO2: 100%  Weight: 181 lb 3.2 oz (82.2 kg)  Height: 6\' 1"  (1.854 m)    Wt Readings from Last 3 Encounters:  02/11/20 181 lb 3.2 oz (82.2 kg)  12/25/19 170 lb (77.1 kg)  11/04/19 180 lb (81.6 kg)     PHYSICAL EXAM General: NAD HEENT: Normal. Neck: No JVD, no thyromegaly. Lungs: Clear to auscultation bilaterally with normal respiratory effort. CV: Regular rate and rhythm, normal S1/diminished S2, no S3/S4, 3/6 ejection systolic murmur over RUSB.  1+ pitting bilateral lower extremity edema, left greater than right. Abdomen: Soft, nontender, no distention.  Neurologic: Alert and oriented.  Psych: Normal affect. Skin: Normal. Musculoskeletal: No gross deformities.      Labs: Lab Results  Component Value Date/Time   K 3.9 06/18/2019 06:01 AM   BUN 20 06/18/2019 06:01 AM   CREATININE 1.24 06/18/2019 06:01 AM   ALT 14 06/18/2019 06:01 AM   HGB 12.2 (L) 06/18/2019 06:01 AM     Lipids: No results found for: LDLCALC, LDLDIRECT, CHOL, TRIG, HDL    NST: 07/2019  There was no ST segment deviation noted during stress.  Defect 1: There is a medium defect of moderate severity present in the mid inferoseptal, mid inferior, mid inferolateral, apical inferior and apical lateral location. There is significant soft tissue attenuation. There may be a degree of myocardial scar but there are no large ischemic territories.  This is a low risk study.  Nuclear stress EF: 52%.  Echocardiogram: 07/2019 IMPRESSIONS   1. Left ventricular ejection fraction, by visual estimation, is 60 to 65%. The left ventricle has normal function. There is mildly increased left ventricular hypertrophy. 2. Left ventricular diastolic Doppler  parameters are consistent with impaired relaxation pattern of LV diastolic filling. 3. Global right ventricle has low normal systolic function.The right ventricular size is normal. No increase in right ventricular wall thickness. 4. Left atrial size was mildly dilated. 5. Right atrial size was mildly dilated. 6. Mild mitral annular calcification. 7. Moderate aortic valve annular calcification. 8. The mitral valve is degenerative. Moderate mitral valve regurgitation. 9. The tricuspid valve is grossly normal. Tricuspid valve regurgitation is mild. 10. The aortic valve is tricuspid Aortic valve regurgitation is mild by color flow Doppler. Moderate to severe aortic valve stenosis. 11. The pulmonic valve was not well visualized.  Pulmonic valve regurgitation is not visualized by color flow Doppler. 12. Mildly elevated pulmonary artery systolic pressure. 13. The inferior vena cava is dilated in size with >50% respiratory variability, suggesting right atrial pressure of 8 mmHg.    ASSESSMENT AND PLAN:  1.  Bilateral leg edema: This is likely due to venous insufficiency.  I will prescribe knee-high compression stockings.  I will also prescribe 20 mg of Lasix to be used as needed.  2.  Aortic stenosis: Moderate to severe by echocardiogram in October 2020.  I will obtain a follow-up echocardiogram.  3.  Bilateral carotid artery stenosis: Status post right carotid endarterectomy.  He remains on aspirin and statin therapy and is followed by vascular surgery.  4.  Hyperlipidemia: Goal LDL less than 70.  He remains on simvastatin 40 mg.   Disposition: Follow up 6 months office visit   Prentice Docker, M.D., F.A.C.C.

## 2020-02-19 ENCOUNTER — Ambulatory Visit (HOSPITAL_COMMUNITY): Admission: RE | Admit: 2020-02-19 | Payer: Medicare Other | Source: Ambulatory Visit

## 2020-04-06 ENCOUNTER — Ambulatory Visit (HOSPITAL_COMMUNITY): Admission: RE | Admit: 2020-04-06 | Payer: Medicare Other | Source: Ambulatory Visit

## 2020-06-28 ENCOUNTER — Inpatient Hospital Stay
Admission: EM | Admit: 2020-06-28 | Discharge: 2020-07-01 | DRG: 291 | Disposition: A | Discharge status: Home Health | Payer: No Typology Code available for payment source | Attending: Internal Medicine | Admitting: Internal Medicine

## 2020-06-28 ENCOUNTER — Encounter: Payer: Self-pay | Admitting: Radiology

## 2020-06-28 ENCOUNTER — Inpatient Hospital Stay
Admit: 2020-06-28 | Discharge: 2020-06-28 | Disposition: A | Discharge status: Home / Self Care | Payer: No Typology Code available for payment source | Attending: Emergency Medicine | Admitting: Emergency Medicine

## 2020-06-28 ENCOUNTER — Emergency Department: Payer: No Typology Code available for payment source

## 2020-06-28 ENCOUNTER — Inpatient Hospital Stay: Payer: No Typology Code available for payment source

## 2020-06-28 DIAGNOSIS — I5033 Acute on chronic diastolic (congestive) heart failure: Secondary | ICD-10-CM

## 2020-06-28 DIAGNOSIS — R9431 Abnormal electrocardiogram [ECG] [EKG]: Secondary | ICD-10-CM

## 2020-06-28 DIAGNOSIS — K219 Gastro-esophageal reflux disease without esophagitis: Secondary | ICD-10-CM | POA: Diagnosis present

## 2020-06-28 DIAGNOSIS — J9601 Acute respiratory failure with hypoxia: Secondary | ICD-10-CM

## 2020-06-28 DIAGNOSIS — R7989 Other specified abnormal findings of blood chemistry: Secondary | ICD-10-CM

## 2020-06-28 DIAGNOSIS — I951 Orthostatic hypotension: Secondary | ICD-10-CM | POA: Diagnosis not present

## 2020-06-28 DIAGNOSIS — I2510 Atherosclerotic heart disease of native coronary artery without angina pectoris: Secondary | ICD-10-CM | POA: Diagnosis present

## 2020-06-28 DIAGNOSIS — Z7982 Long term (current) use of aspirin: Secondary | ICD-10-CM

## 2020-06-28 DIAGNOSIS — R059 Cough, unspecified: Secondary | ICD-10-CM

## 2020-06-28 DIAGNOSIS — Z20822 Contact with and (suspected) exposure to covid-19: Secondary | ICD-10-CM | POA: Diagnosis present

## 2020-06-28 DIAGNOSIS — I350 Nonrheumatic aortic (valve) stenosis: Secondary | ICD-10-CM | POA: Diagnosis present

## 2020-06-28 DIAGNOSIS — I4510 Unspecified right bundle-branch block: Secondary | ICD-10-CM | POA: Diagnosis present

## 2020-06-28 DIAGNOSIS — R778 Other specified abnormalities of plasma proteins: Secondary | ICD-10-CM

## 2020-06-28 DIAGNOSIS — N1831 Chronic kidney disease, stage 3a: Secondary | ICD-10-CM | POA: Diagnosis present

## 2020-06-28 DIAGNOSIS — R05 Cough: Secondary | ICD-10-CM | POA: Diagnosis present

## 2020-06-28 DIAGNOSIS — Z9889 Other specified postprocedural states: Secondary | ICD-10-CM | POA: Diagnosis not present

## 2020-06-28 DIAGNOSIS — Z96653 Presence of artificial knee joint, bilateral: Secondary | ICD-10-CM | POA: Diagnosis present

## 2020-06-28 DIAGNOSIS — I509 Heart failure, unspecified: Secondary | ICD-10-CM

## 2020-06-28 DIAGNOSIS — J189 Pneumonia, unspecified organism: Secondary | ICD-10-CM | POA: Diagnosis present

## 2020-06-28 DIAGNOSIS — Z79899 Other long term (current) drug therapy: Secondary | ICD-10-CM

## 2020-06-28 DIAGNOSIS — I5043 Acute on chronic combined systolic (congestive) and diastolic (congestive) heart failure: Principal | ICD-10-CM | POA: Diagnosis present

## 2020-06-28 DIAGNOSIS — E871 Hypo-osmolality and hyponatremia: Secondary | ICD-10-CM | POA: Diagnosis present

## 2020-06-28 DIAGNOSIS — R0602 Shortness of breath: Secondary | ICD-10-CM

## 2020-06-28 LAB — ECHOCARDIOGRAM COMPLETE BUBBLE STUDY
AR max vel: 0.69 cm2
AV Area VTI: 0.52 cm2
AV Area mean vel: 0.56 cm2
AV Mean grad: 24 mmHg
AV Peak grad: 44.2 mmHg
Ao pk vel: 3.32 m/s
Area-P 1/2: 4.44 cm2
Calc EF: 34.1 %
S' Lateral: 3.63 cm
Single Plane A2C EF: 28.6 %
Single Plane A4C EF: 38.9 %

## 2020-06-28 LAB — TROPONIN I (HIGH SENSITIVITY)
Troponin I (High Sensitivity): 50 ng/L — ABNORMAL HIGH (ref <18)
Troponin I (High Sensitivity): 75 ng/L — ABNORMAL HIGH (ref <18)

## 2020-06-28 LAB — BLOOD GAS, VENOUS
Acid-base deficit: 1 mmol/L (ref 0.0–2.0)
Bicarbonate: 25.8 mmol/L (ref 20.0–28.0)
O2 Saturation: 39.2 %
Patient temperature: 37
pCO2, Ven: 50 mmHg (ref 44.0–60.0)
pH, Ven: 7.32 (ref 7.250–7.430)
pO2, Ven: <31 mmHg — CL (ref 32.0–45.0)

## 2020-06-28 LAB — CBC WITH DIFFERENTIAL/PLATELET
Abs Immature Granulocytes: 0.04 10*3/uL (ref 0.00–0.07)
Basophils Absolute: 0.1 10*3/uL (ref 0.0–0.1)
Basophils Relative: 1 %
Eosinophils Absolute: 0.1 10*3/uL (ref 0.0–0.5)
Eosinophils Relative: 1 %
HCT: 42.2 % (ref 39.0–52.0)
Hemoglobin: 13.9 g/dL (ref 13.0–17.0)
Immature Granulocytes: 0 %
Lymphocytes Relative: 14 %
Lymphs Abs: 1.4 10*3/uL (ref 0.7–4.0)
MCH: 32.5 pg (ref 26.0–34.0)
MCHC: 32.9 g/dL (ref 30.0–36.0)
MCV: 98.6 fL (ref 80.0–100.0)
Monocytes Absolute: 1 10*3/uL (ref 0.1–1.0)
Monocytes Relative: 10 %
Neutro Abs: 7.3 10*3/uL (ref 1.7–7.7)
Neutrophils Relative %: 74 %
Platelets: 194 10*3/uL (ref 150–400)
RBC: 4.28 MIL/uL (ref 4.22–5.81)
RDW: 14 % (ref 11.5–15.5)
WBC: 10 10*3/uL (ref 4.0–10.5)
nRBC: 0 % (ref 0.0–0.2)

## 2020-06-28 LAB — MAGNESIUM: Magnesium: 2.1 mg/dL (ref 1.7–2.4)

## 2020-06-28 LAB — COMPREHENSIVE METABOLIC PANEL
ALT: 17 U/L (ref 0–44)
AST: 20 U/L (ref 15–41)
Albumin: 3.9 g/dL (ref 3.5–5.0)
Alkaline Phosphatase: 67 U/L (ref 38–126)
Anion gap: 9 (ref 5–15)
BUN: 15 mg/dL (ref 8–23)
CO2: 24 mmol/L (ref 22–32)
Calcium: 9 mg/dL (ref 8.9–10.3)
Chloride: 105 mmol/L (ref 98–111)
Creatinine, Ser: 1.27 mg/dL — ABNORMAL HIGH (ref 0.61–1.24)
GFR calc Af Amer: 56 mL/min — ABNORMAL LOW (ref >60)
GFR calc non Af Amer: 48 mL/min — ABNORMAL LOW (ref >60)
Glucose, Bld: 136 mg/dL — ABNORMAL HIGH (ref 70–99)
Potassium: 4.3 mmol/L (ref 3.5–5.1)
Sodium: 138 mmol/L (ref 135–145)
Total Bilirubin: 0.9 mg/dL (ref 0.3–1.2)
Total Protein: 6.8 g/dL (ref 6.5–8.1)

## 2020-06-28 LAB — SARS CORONAVIRUS 2 BY RT PCR (HOSPITAL ORDER, PERFORMED IN ~~LOC~~ HOSPITAL LAB): SARS Coronavirus 2: NEGATIVE

## 2020-06-28 LAB — FIBRIN DERIVATIVES D-DIMER (ARMC ONLY): Fibrin derivatives D-dimer (ARMC): 617.32 ng/mL (FEU) — ABNORMAL HIGH (ref 0.00–499.00)

## 2020-06-28 LAB — BRAIN NATRIURETIC PEPTIDE: B Natriuretic Peptide: 1424.7 pg/mL — ABNORMAL HIGH (ref 0.0–100.0)

## 2020-06-28 LAB — PROCALCITONIN: Procalcitonin: <0.1 ng/mL

## 2020-06-28 MED ORDER — ASPIRIN EC 325 MG PO TBEC
325.0000 mg | DELAYED_RELEASE_TABLET | Freq: Every day | ORAL | Status: DC
  Filled 2020-06-28: qty 1

## 2020-06-28 MED ORDER — ENOXAPARIN SODIUM 40 MG/0.4ML ~~LOC~~ SOLN
40.0000 mg | SUBCUTANEOUS | Status: DC
  Administered 2020-06-28 – 2020-07-01 (×4): 40 mg via SUBCUTANEOUS
  Filled 2020-06-28 (×4): qty 0.4

## 2020-06-28 MED ORDER — SODIUM CHLORIDE 0.9% FLUSH
3.0000 mL | INTRAVENOUS | Status: DC | PRN
  Administered 2020-06-30: 3 mL via INTRAVENOUS

## 2020-06-28 MED ORDER — ASPIRIN EC 325 MG PO TBEC: 325.0000 mg | DELAYED_RELEASE_TABLET | Freq: Every day | ORAL | Status: DC

## 2020-06-28 MED ORDER — LISINOPRIL 5 MG PO TABS
2.5000 mg | ORAL_TABLET | Freq: Every day | ORAL | Status: DC
  Administered 2020-06-28: 2.5 mg via ORAL
  Filled 2020-06-28: qty 1

## 2020-06-28 MED ORDER — ONDANSETRON HCL 4 MG/2ML IJ SOLN: 4.0000 mg | Freq: Four times a day (QID) | INTRAMUSCULAR | Status: DC | PRN

## 2020-06-28 MED ORDER — ACETAMINOPHEN 325 MG PO TABS: 650.0000 mg | ORAL_TABLET | ORAL | Status: DC | PRN

## 2020-06-28 MED ORDER — TECHNETIUM TO 99M ALBUMIN AGGREGATED
4.0000 | Freq: Once | INTRAVENOUS | Status: AC | PRN
  Administered 2020-06-28: 3.82 via INTRAVENOUS

## 2020-06-28 MED ORDER — SODIUM CHLORIDE 0.9% FLUSH
3.0000 mL | Freq: Two times a day (BID) | INTRAVENOUS | Status: DC
  Administered 2020-06-28 – 2020-07-01 (×7): 3 mL via INTRAVENOUS

## 2020-06-28 MED ORDER — FUROSEMIDE 10 MG/ML IJ SOLN
40.0000 mg | Freq: Two times a day (BID) | INTRAMUSCULAR | Status: DC
  Administered 2020-06-28: 40 mg via INTRAVENOUS
  Filled 2020-06-28 (×2): qty 4

## 2020-06-28 MED ORDER — SODIUM CHLORIDE 0.9 % IV SOLN: 250.0000 mL | INTRAVENOUS | Status: DC | PRN

## 2020-06-28 MED ORDER — LEVOFLOXACIN 500 MG PO TABS
500.0000 mg | ORAL_TABLET | Freq: Every day | ORAL | Status: DC
  Administered 2020-06-28: 500 mg via ORAL
  Filled 2020-06-28: qty 1

## 2020-06-28 MED ORDER — FUROSEMIDE 10 MG/ML IJ SOLN
80.0000 mg | Freq: Once | INTRAMUSCULAR | Status: AC
  Administered 2020-06-28: 80 mg via INTRAVENOUS
  Filled 2020-06-28: qty 8

## 2020-06-28 MED ORDER — ASPIRIN EC 325 MG PO TBEC
325.0000 mg | DELAYED_RELEASE_TABLET | Freq: Once | ORAL | Status: AC
  Administered 2020-06-28: 325 mg via ORAL

## 2020-06-28 MED ORDER — AZITHROMYCIN 250 MG PO TABS
500.0000 mg | ORAL_TABLET | Freq: Every day | ORAL | Status: DC
  Administered 2020-06-29 – 2020-07-01 (×3): 500 mg via ORAL
  Filled 2020-06-28 (×3): qty 2

## 2020-06-28 MED ORDER — SODIUM CHLORIDE 0.9 % IV SOLN
1.0000 g | INTRAVENOUS | Status: DC
  Administered 2020-06-28 – 2020-06-30 (×3): 1 g via INTRAVENOUS
  Filled 2020-06-28 (×2): qty 1
  Filled 2020-06-28: qty 10

## 2020-06-28 NOTE — Progress Notes (Signed)
PROGRESS NOTE    Patient: Jeffery Smith                            PCP: System, Provider Not In                    DOB: [birth year]            DOA: 06/28/2020 KDX:[id number]             DOS: 06/28/2020, 7:11 AM   LOS: 0 days   Date of Service: The patient was seen and examined on 06/28/2020  Subjective:   The patient was seen and examined this Am. Accompanied by his wife. Patient wife states that he is not O2 dependent, currently requiring 4 L of oxygen to maintain O2 sat of 91%. Complaining of shortness of breath no chest pain   Brief Narrative:   HPI: Jeffery Smith 86 year old male with history of diastolic heart failure, last EF 60-65 in 07/2019, carotid artery stenosis status post endarterectomy, moderate to severe aortic stenosis, CKD 3a, presenting with shortness of breath. Likely due to pneumonia, CHF exacerbation.. Acute respiratory failure hypoxemia 85% on room air. 92% on 4 L of oxygen.  ED Course:  In acute respiratory distress with increased work of breathing.  He was tachycardic at 112 tachypneic at 26 with O2 sat 92% on O2 at 4 L.  BP 157/92.  Blood work significant for troponin 50, BNP 1424, D-dimer 617, procalcitonin less than 0.1.  WBC WNL.  Creatinine 1.27 which is about his baseline.  Chest x-ray showed new diffuse bilateral infiltrates consistent with multifocal pneumonia.   Covid negative -patient is immunized EKG: Sinus tachycardia with nonspecific ST-T wave changes Patient treated with IV Lasix.     Assessment & Plan:   Principal Problem:   Acute on chronic diastolic CHF (congestive heart failure) (HCC) Active Problems:   Acute respiratory failure with hypoxia (HCC)   Moderate to severe aortic stenosis   H/O carotid endarterectomy   Elevated troponin   Chronic kidney disease, stage 3a   Acute respiratory failure with hypoxia -Remains in respiratory distress, requiring 4 L of oxygen to maintain O2 sat of 91% -Continue pulmonary toiletry,  as needed DuoNeb -Likely exacerbated by CHF exacerbation possible pneumonia -Elevated D-dimer obtaining a VQ scan to rule out PE -Initiating empiric antibiotics -Continue O2 supplements  -Patient presenting with respiratory distress with O2 sat 85% on room air, increased work of breathing unable to speak in complete sentences and requiring NRB on arrival of EMS -Secondary to heart failure exacerbation though chest x-ray suggested multifocal pneumonia.   Covid negative     Acute on chronic diastolic CHF (congestive heart failure) (HCC) -Continue with Lasix, Monitor I's and O's, daily weight - BNP: 1424.7 -Low-dose IV Lasix given the aortic stenosis, low-dose ARB.  No beta-blocker given acute nature -Echocardiogram:   -Daily weights intake and output monitoring -Cardiology on-call consulted pending response    Elevated troponin -Denies any chest pain -Troponin of 50 >> 75 -Patient had low risk nuclear stress test October 2020 -Continue aspirin and statin    Moderate to severe aortic stenosis.  Carotid stenosis -Avoid aggressive diuresis -Continue statins, aspirin   H/O carotid endarterectomy    Chronic kidney disease, stage 3a -Renal function at baseline   Nutritional status:        Cultures; Blood Cultures x 2 >> NGT   Antimicrobials: P.o. Levaquin 06/28/2020 >>  Consultants: Cardiology   -------------------------------------------------------------------------------------------------------------------------------------------- DVT prophylaxis:  SCD/Compression stockings and Lovenox SQ Code Status:   Code Status: Full Code Family Communication: No family member present at bedside- attempt will be made to updatehis wife daily The above findings and plan of care has been discussed with patient (and family )  in detail,  they expressed understanding and agreement of above. -Advance care planning has been discussed.   Admission status:    Status is:  Inpatient  Remains inpatient appropriate because:Inpatient level of care appropriate due to severity of illness   Dispo: The patient is from: Home              Anticipated d/c is to: Home              Anticipated d/c date is: 3 days              Patient currently is not medically stable to d/c.        Procedures:   No admission procedures for hospital encounter.   Antimicrobials:  Anti-infectives (From admission, onward)   None       Medication:  . enoxaparin (LOVENOX) injection  40 mg Subcutaneous Q24H  . furosemide  40 mg Intravenous Q12H  . lisinopril  2.5 mg Oral Daily  . sodium chloride flush  3 mL Intravenous Q12H    sodium chloride, acetaminophen, ondansetron (ZOFRAN) IV, sodium chloride flush   Objective:   Vitals:   06/28/20 0555 06/28/20 0557 06/28/20 0630 06/28/20 0650  BP:   108/63   Smith: (!) 42 78 (!) 43 73  Resp: 20 (!) 21 (!) 24 (!) 24  Temp:      TempSrc:      SpO2: 93% 95% 96% 91%  Weight:      Height:        Intake/Output Summary (Last 24 hours) at 06/28/2020 0711 Last data filed at 06/28/2020 0646 Gross per 24 hour  Intake --  Output 1350 ml  Net -1350 ml   Filed Weights   06/28/20 0133  Weight: 79.4 kg     Examination:   Physical Exam  Constitution:  Alert, cooperative, no distress,  Appears calm and comfortable  Psychiatric: Normal and stable mood and affect, cognition intact,   HEENT: Normocephalic, PERRL, otherwise with in Normal limits  Chest:Chest symmetric Cardio vascular:  S1/S2, RRR, No murmure, No Rubs or Gallops  pulmonary: Bilateral rhonchi, minimal crackles at lower lobes otherwise clear to auscultation bilaterally, positive air exchange, no further wheezes or rales.  Abdomen: Soft, non-tender, non-distended, bowel sounds,no masses, no organomegaly Muscular skeletal: Limited exam - in bed, able to move all 4 extremities, Normal strength,  Neuro: CNII-XII intact. , normal motor and sensation, reflexes intact   Extremities: No pitting edema lower extremities, +2 pulses  Skin: Dry, warm to touch, negative for any Rashes, No open wounds Wounds: per nursing documentation    ------------------------------------------------------------------------------------------------------------------------------------------    LABs:  CBC Latest Ref Rng & Units 06/28/2020 06/18/2019 12/04/2018  WBC 4.0 - 10.5 K/uL 10.0 5.2 6.6  Hemoglobin 13.0 - 17.0 g/dL 46.2 12.2(L) 12.8(L)  Hematocrit 39 - 52 % 42.2 37.7(L) 38.7(L)  Platelets 150 - 400 K/uL 194 176 163   CMP Latest Ref Rng & Units 06/28/2020 06/18/2019 12/04/2018  Glucose 70 - 99 mg/dL 863(O) 177(N) 165(B)  BUN 8 - 23 mg/dL 15 20 16   Creatinine 0.61 - 1.24 mg/dL ) 9.03(Y 3.33  Sodium 135 - 145 mmol/L 138 136 140  Potassium 3.5 -  5.1 mmol/L 4.3 3.9 4.2  Chloride 98 - 111 mmol/L 105 107 108  CO2 22 - 32 mmol/L 24 23 25   Calcium 8.9 - 10.3 mg/dL 9.0 ) 9.4  Total Protein 6.5 - 8.1 g/dL 6.8 5.9(L) -  Total Bilirubin 0.3 - 1.2 mg/dL 0.9 0.5 -  Alkaline Phos 38 - 126 U/L 67 54 -  AST 15 - 41 U/L 20 20 -  ALT 0 - 44 U/L 17 14 -       Micro Results Recent Results (from the past 240 hour(s))  SARS Coronavirus 2 by RT PCR (hospital order, performed in [facility] hospital lab) Nasopharyngeal Nasopharyngeal Swab     Status: None   Collection Time: 06/28/20  1:31 AM   Specimen: Nasopharyngeal Swab  Result Value Ref Range Status   SARS Coronavirus 2 NEGATIVE NEGATIVE Final    Comment: (NOTE) SARS-CoV-2 target nucleic acids are NOT DETECTED.  The SARS-CoV-2 RNA is generally detectable in upper and lower respiratory specimens during the acute phase of infection. The lowest concentration of SARS-CoV-2 viral copies this assay can detect is 250 copies / mL. A negative result does not preclude SARS-CoV-2 infection and should not be used as the sole basis for treatment or other patient management decisions.  A negative result may occur with improper  specimen collection / handling, submission of specimen other than nasopharyngeal swab, presence of viral mutation(s) within the areas targeted by this assay, and inadequate number of viral copies (<250 copies / mL). A negative result must be combined with clinical observations, patient history, and epidemiological information.  Fact Sheet for Patients:   06/30/20  Fact Sheet for Healthcare Providers: BoilerBrush.com.cy  This test is not yet approved or  cleared by the https://pope.com/ FDA and has been authorized for detection and/or diagnosis of SARS-CoV-2 by FDA under an Emergency Use Authorization (EUA).  This EUA will remain in effect (meaning this test can be used) for the duration of the COVID-19 declaration under Section 564(b)(1) of the Act, 21 U.S.C. section 360bbb-3(b)(1), unless the authorization is terminated or revoked sooner.  Performed at [facility], [address], [place], [place]     Radiology Reports DG Chest 1 View  Result Date: 06/28/2020 CLINICAL DATA:  Shortness of breath for 2 weeks EXAM: CHEST  1 VIEW COMPARISON:  06/18/2019 FINDINGS: Cardiac shadow is stable. Aortic calcifications are noted. Diffuse bilateral infiltrates are identified. No sizable effusion is seen. No bony abnormality is noted. IMPRESSION: New diffuse bilateral infiltrates consistent with multifocal pneumonia. Electronically Signed   By: 08/18/2019 M.D.   On: 06/28/2020 01:57    SIGNED: 06/30/2020, MD, FACP, FHM. [facility],  Pager (please use [email or web address] to page/text)  If 7PM-7AM, please contact night-coverage [email or web address], Password [facility] 06/28/2020, 7:11 AM

## 2020-06-28 NOTE — H&P (Signed)
History and Physical    Jeffery Smith LNL:[id number] DOB: 09/06/1925 DOA: 06/28/2020  PCP: System, Provider Not In   Patient coming from: Home  I have personally briefly reviewed patient's old medical records in [facility]  Chief Complaint: Shortness of breath  HPI: Jeffery Smith is a 86 y.o. male with medical history significant for diastolic heart failure, last EF 60-65 in 07/2019, carotid artery stenosis status post endarterectomy, moderate to severe aortic stenosis, , who presents to the emergency room with a 2-week history of progressively worsening shortness of breath that acutely worsened on the night of arrival.  He has lower extremity edema, orthopnea.  He denies chest pain.  He admits to congested but nonproductive cough that his wife attributes to allergies, but denied fever or chills.  Denies chest pain.  On arrival of EMS O2 sats was 85% on room air improving to the high 90s on nonrebreather.  ED Course: On arrival in the emergency room he was in acute respiratory distress with increased work of breathing.  He was tachycardic at 112 tachypneic at 26 with O2 sat 92% on O2 at 4 L.  BP 157/92.  Blood work significant for troponin 50, BNP 1424, D-dimer 617, procalcitonin less than 0.1.  WBC WNL.  Creatinine 1.27 which is about his baseline.  Chest x-ray showed new diffuse bilateral infiltrates consistent with multifocal pneumonia.  Covid negative EKG as interpreted by me: Sinus tachycardia with nonspecific ST-T wave changes Patient treated with IV Lasix.  Hospitalist consulted for admission.  Review of Systems: As per HPI otherwise all other systems on review of systems negative.    Past Medical History:  Diagnosis Date  . Arthritis   . Benign head tremor   . Carotid artery occlusion    right side   . Dysrhythmia    irregular heart beat per patient- skips a beat   . Enlarged prostate   . GERD (gastroesophageal reflux disease)   . Hard of hearing   .  Hyperlipidemia   . Sore throat     Past Surgical History:  Procedure Laterality Date  . CATARACT EXTRACTION     left  . ENDARTERECTOMY  11/03/2011   Procedure: ENDARTERECTOMY CAROTID;  Surgeon: Larina Earthly, MD;  Location: [facility] OR;  Service: Vascular;  Laterality: Right;  Right Carotid endarterectomy with patch angiopolasty  . TONSILLECTOMY    . TOTAL KNEE ARTHROPLASTY     left  . TOTAL KNEE ARTHROPLASTY Right 09/21/2014   Procedure: RIGHT TOTAL KNEE ARTHROPLASTY;  Surgeon: Loanne Drilling, MD;  Location: [facility] ORS;  Service: Orthopedics;  Laterality: Right;     reports that he has never smoked. He has never used smokeless tobacco. He reports current alcohol use. He reports that he does not use drugs.  Allergies  Allergen Reactions  . Horse-Derived Products Anaphylaxis    Family History  Problem Relation Age of Onset  . Cancer Mother   . Cancer Brother       Prior to Admission medications   Medication Sig Start Date End Date Taking? Authorizing Provider  Apoaequorin (PREVAGEN EXTRA STRENGTH PO) Take 1 tablet by mouth every evening. Chewables    [provider]  aspirin EC 81 MG tablet Take 1 tablet (81 mg total) by mouth daily. 10/07/19   Strader, Lennart Pall, PA-C  finasteride (PROSCAR) 5 MG tablet Take 5 mg by mouth at bedtime.     [provider]  furosemide (LASIX) 20 MG tablet Take 1 tablet (  20 mg total) by mouth as needed for edema (leg swelling). 02/11/20 05/11/20  Laqueta Linden, MD  ipratropium (ATROVENT) 0.06 % nasal spray Place 2 sprays into both nostrils at bedtime.    [provider]  omeprazole (PRILOSEC) 20 MG capsule Take 20 mg by mouth at bedtime.     [provider]  sildenafil (VIAGRA) 100 MG tablet Take 1 tablet (100 mg total) by mouth daily as needed for erectile dysfunction. 11/04/19   Marcine Matar, MD  simvastatin (ZOCOR) 80 MG tablet Take 40 mg by mouth at bedtime.     [provider]  tamsulosin (FLOMAX)  0.4 MG CAPS capsule Take 0.8 mg by mouth every evening.    [provider]    Physical Exam: Vitals:   06/28/20 0144 06/28/20 0200 06/28/20 0230 06/28/20 0300  BP:  (!) 134/101 129/77 131/87  Pulse:  (!) 105 95 (!) 103  Resp:  (!) 27 (!) 24 (!) 28  Temp: 98.1 F (36.7 C)     TempSrc: Oral     SpO2:  94% 93% 92%  Weight:      Height:         Vitals:   06/28/20 0144 06/28/20 0200 06/28/20 0230 06/28/20 0300  BP:  (!) 134/101 129/77 131/87  Pulse:  (!) 105 95 (!) 103  Resp:  (!) 27 (!) 24 (!) 28  Temp: 98.1 F (36.7 C)     TempSrc: Oral     SpO2:  94% 93% 92%  Weight:      Height:          Constitutional: Alert and oriented x 2 .  Increased work of breathing with conversational dyspnea HEENT:      Head: Normocephalic and atraumatic.         Eyes: PERLA, EOMI, Conjunctivae are normal. Sclera is non-icteric.       Mouth/Throat: Mucous membranes are moist.       Neck: Supple with no signs of meningismus. Cardiovascular: Regular rate and rhythm. No murmurs, gallops, or rubs. 2+ symmetrical distal pulses are present . No JVD. No 2+LE edema Respiratory: Respiratory effort increased.bibasilar rales Gastrointestinal: Soft, non tender, and non distended with positive bowel sounds. No rebound or guarding. Genitourinary: No CVA tenderness. Musculoskeletal: Nontender with normal range of motion in all extremities. No cyanosis, or erythema of extremities. Neurologic:  Face is symmetric. Moving all extremities. No gross focal neurologic deficits . Skin: Skin is warm, dry.  No rash or ulcers Psychiatric: Mood and affect are normal    Labs on Admission: I have personally reviewed following labs and imaging studies  CBC: Recent Labs  Lab 06/28/20 0131  WBC 10.0  NEUTROABS 7.3  HGB 13.9  HCT 42.2  MCV 98.6  PLT 194   Basic Metabolic Panel: Recent Labs  Lab 06/28/20 0131  NA 138  K 4.3  CL 105  CO2 24  GLUCOSE 136*  BUN 15  CREATININE 1.27*  CALCIUM 9.0    MG 2.1   GFR: Estimated Creatinine Clearance: 40.8 mL/min (A) (by C-G formula based on SCr of 1.27 mg/dL (H)). Liver Function Tests: Recent Labs  Lab 06/28/20 0131  AST 20  ALT 17  ALKPHOS 67  BILITOT 0.9  PROT 6.8  ALBUMIN 3.9   No results for input(s): LIPASE, AMYLASE in the last 168 hours. No results for input(s): AMMONIA in the last 168 hours. Coagulation Profile: No results for input(s): INR, PROTIME in the last 168 hours. Cardiac Enzymes: No  results for input(s): CKTOTAL, CKMB, CKMBINDEX, TROPONINI in the last 168 hours. BNP (last 3 results) No results for input(s): PROBNP in the last 8760 hours. HbA1C: No results for input(s): HGBA1C in the last 72 hours. CBG: No results for input(s): GLUCAP in the last 168 hours. Lipid Profile: No results for input(s): CHOL, HDL, LDLCALC, TRIG, CHOLHDL, LDLDIRECT in the last 72 hours. Thyroid Function Tests: No results for input(s): TSH, T4TOTAL, FREET4, T3FREE, THYROIDAB in the last 72 hours. Anemia Panel: No results for input(s): VITAMINB12, FOLATE, FERRITIN, TIBC, IRON, RETICCTPCT in the last 72 hours. Urine analysis:    Component Value Date/Time   COLORURINE YELLOW 09/14/2014 0920   APPEARANCEUR CLEAR 09/14/2014 0920   LABSPEC 1.019 09/14/2014 0920   PHURINE 5.5 09/14/2014 0920   GLUCOSEU NEGATIVE 09/14/2014 0920   HGBUR NEGATIVE 09/14/2014 0920   BILIRUBINUR neg 11/04/2019 0903   KETONESUR NEGATIVE 09/14/2014 0920   PROTEINUR Negative 11/04/2019 0903   PROTEINUR NEGATIVE 09/14/2014 0920   UROBILINOGEN negative (A) 11/04/2019 0903   UROBILINOGEN 0.2 09/14/2014 0920   NITRITE neg 11/04/2019 0903   NITRITE NEGATIVE 09/14/2014 0920   LEUKOCYTESUR Negative 11/04/2019 0903    Radiological Exams on Admission: DG Chest 1 View  Result Date: 06/28/2020 CLINICAL DATA:  Shortness of breath for 2 weeks EXAM: CHEST  1 VIEW COMPARISON:  06/18/2019 FINDINGS: Cardiac shadow is stable. Aortic calcifications are noted. Diffuse  bilateral infiltrates are identified. No sizable effusion is seen. No bony abnormality is noted. IMPRESSION: New diffuse bilateral infiltrates consistent with multifocal pneumonia. Electronically Signed   By: Alcide CleverMark  Lukens M.D.   On: 06/28/2020 01:57     Assessment/Plan 86 year old male with history of diastolic heart failure, last EF 60-65 in 07/2019, carotid artery stenosis status post endarterectomy, moderate to severe aortic stenosis, CKD 3a, presenting with shortness of breath  Acute respiratory failure with hypoxia -Patient presenting with respiratory distress with O2 sat 85% on room air, increased work of breathing unable to speak in complete sentences and requiring NRB on arrival of EMS -Secondary to heart failure exacerbation though chest x-ray suggested multifocal pneumonia.  Covid negative and procalcitonin less than 0.10.  D-dimer elevated but presentation more in keeping with heart failure. -Supplemental oxygen to keep sats over 92%    Acute on chronic diastolic CHF (congestive heart failure) (HCC) -Low-dose IV Lasix given the aortic stenosis, low-dose ARB.  No beta-blocker given acute nature -Echocardiogram in the a.m. -Daily weights intake and output monitoring -Consult cardiology in the a.m.    Elevated troponin -Troponin of 50.  Continue to trend to peak -Patient had low risk nuclear stress test October 2020 -Continue aspirin and statin    Moderate to severe aortic stenosis -Avoid aggressive diuresis   H/O carotid endarterectomy    Chronic kidney disease, stage 3a -Renal function at baseline    DVT prophylaxis: Lovenox  Code Status: full code  Family Communication: Wife at bedside Disposition Plan: Back to previous home environment Consults called: none  Status:At the time of admission, it appears that the appropriate admission status for this patient is INPATIENT. This is judged to be reasonable and necessary in order to provide the required intensity of service  to ensure the patient's safety given the presenting symptoms, physical exam findings, and initial radiographic and laboratory data in the context of their  Comorbid conditions.   Patient requires inpatient status due to high intensity of service, high risk for further deterioration and high frequency of surveillance required.   I certify that  at the point of admission it is my clinical judgment that the patient will require inpatient hospital care spanning beyond 2 midnights     Andris Baumann MD [facility]     06/28/2020, 3:29 AM

## 2020-06-28 NOTE — ED Notes (Addendum)
Pts heart rate has been jumping from being in the 40s to 140s. pt denies history of a-fib. Pt denies chest pain or palpitations. Provider messaged/notified. Repeat EKG done and exported to chart.

## 2020-06-28 NOTE — ED Notes (Signed)
Pt in room on 4LPM via [place] at this time. ER provider aware. Pt is on cardiac, bp and pulse ox monitor.

## 2020-06-28 NOTE — ED Provider Notes (Signed)
[facility] Emergency Department Provider Note  ____________________________________________   First MD Initiated Contact with Patient 06/28/20 0127     (approximate)  I have reviewed the triage vital signs and the nursing notes.   HISTORY  Chief Complaint Shortness of Breath   HPI Jeffery Smith is a 86 y.o. male with a past medical history of arthritis, CAD, irregular heartbeat, BPH, GERD, HDL, and hearing loss who presents via EMS from home for assessment of approximately 2 weeks of progressively worsening shortness of breath associated with cough and congestion that developed into pink-tinged sputum this evening.  No prior similar episodes.  No clear alleviating aggravating factors.  Patient denies any headache, earache, sore throat, chest pain, dental pain, nausea, vomiting, diarrhea, dysuria, back pain, rash, extremity pain, acute complaints.  Denies tobacco abuse.  Patient did receive 1 inch of Nitropaste in route via EMS.  He was reportedly hypoxic with SPO2 saturation of 85% on room air on EMS arrival but this improved to the high 90s on nonrebreather.         Past Medical History:  Diagnosis Date  . Arthritis   . Benign head tremor   . Carotid artery occlusion    right side   . Dysrhythmia    irregular heart beat per patient- skips a beat   . Enlarged prostate   . GERD (gastroesophageal reflux disease)   . Hard of hearing   . Hyperlipidemia   . Sore throat     Patient Active Problem List   Diagnosis Date Noted  . OA (osteoarthritis) of knee 09/21/2014  . Occlusion and stenosis of carotid artery without mention of cerebral infarction 11/21/2011    Past Surgical History:  Procedure Laterality Date  . CATARACT EXTRACTION     left  . ENDARTERECTOMY  11/03/2011   Procedure: ENDARTERECTOMY CAROTID;  Surgeon: Larina Earthly, MD;  Location: [facility] OR;  Service: Vascular;  Laterality: Right;  Right Carotid endarterectomy with patch  angiopolasty  . TONSILLECTOMY    . TOTAL KNEE ARTHROPLASTY     left  . TOTAL KNEE ARTHROPLASTY Right 09/21/2014   Procedure: RIGHT TOTAL KNEE ARTHROPLASTY;  Surgeon: Loanne Drilling, MD;  Location: [facility] ORS;  Service: Orthopedics;  Laterality: Right;    Prior to Admission medications   Medication Sig Start Date End Date Taking? Authorizing Provider  Apoaequorin (PREVAGEN EXTRA STRENGTH PO) Take 1 tablet by mouth every evening. Chewables    [provider]  aspirin EC 81 MG tablet Take 1 tablet (81 mg total) by mouth daily. 10/07/19   Strader, Lennart Pall, PA-C  finasteride (PROSCAR) 5 MG tablet Take 5 mg by mouth at bedtime.     [provider]  furosemide (LASIX) 20 MG tablet Take 1 tablet (20 mg total) by mouth as needed for edema (leg swelling). 02/11/20 05/11/20  Laqueta Linden, MD  ipratropium (ATROVENT) 0.06 % nasal spray Place 2 sprays into both nostrils at bedtime.    [provider]  omeprazole (PRILOSEC) 20 MG capsule Take 20 mg by mouth at bedtime.     [provider]  sildenafil (VIAGRA) 100 MG tablet Take 1 tablet (100 mg total) by mouth daily as needed for erectile dysfunction. 11/04/19   Marcine Matar, MD  simvastatin (ZOCOR) 80 MG tablet Take 40 mg by mouth at bedtime.     [provider]  tamsulosin (FLOMAX) 0.4 MG CAPS capsule Take 0.8 mg by mouth every evening.    [provider]    Allergies Horse-derived products  Family History  Problem Relation Age of Onset  . Cancer Mother   . Cancer Brother     Social History Social History   Tobacco Use  . Smoking status: Never Smoker  . Smokeless tobacco: Never Used  Vaping Use  . Vaping Use: Never used  Substance Use Topics  . Alcohol use: Yes    Comment: 1 glass of wine daily  . Drug use: No    Review of Systems  Review of Systems  Constitutional: Positive for malaise/fatigue. Negative for chills and fever.  HENT: Negative for sore throat.   Eyes:  Negative for pain.  Respiratory: Positive for cough, hemoptysis, sputum production and shortness of breath. Negative for stridor.   Cardiovascular: Negative for chest pain.  Gastrointestinal: Negative for vomiting.  Skin: Negative for rash.  Neurological: Negative for seizures, loss of consciousness and headaches.  Psychiatric/Behavioral: Negative for suicidal ideas.  All other systems reviewed and are negative.     ____________________________________________   PHYSICAL EXAM:  VITAL SIGNS: ED Triage Vitals  Enc Vitals Group     BP      Pulse      Resp      Temp      Temp src      SpO2      Weight      Height      Head Circumference      Peak Flow      Pain Score      Pain Loc      Pain Edu?      Excl. in GC?    Vitals:   06/28/20 0134 06/28/20 0144  BP: (!) 157/92   Pulse: (!) 112   Resp: (!) 26   Temp:  98.1 F (36.7 C)  SpO2: 92%    Physical Exam Vitals and nursing note reviewed.  Constitutional:      General: He is in acute distress.     Appearance: He is well-developed.  HENT:     Head: Normocephalic and atraumatic.     Right Ear: External ear normal.     Left Ear: External ear normal.     Nose: Nose normal.  Eyes:     Conjunctiva/sclera: Conjunctivae normal.  Cardiovascular:     Rate and Rhythm: Normal rate and regular rhythm.     Heart sounds: No murmur heard.   Pulmonary:     Effort: Tachypnea, accessory muscle usage and respiratory distress present.     Breath sounds: Rales present.  Abdominal:     Palpations: Abdomen is soft.     Tenderness: There is no abdominal tenderness.  Musculoskeletal:     Cervical back: Neck supple.     Right lower leg: Edema present.     Left lower leg: Edema present.  Skin:    General: Skin is warm and dry.  Neurological:     Mental Status: He is alert and oriented to person, place, and time.  Psychiatric:        Mood and Affect: Mood normal.      ____________________________________________    LABS (all labs ordered are listed, but only abnormal results are displayed)  Labs Reviewed  BLOOD GAS, VENOUS - Abnormal; Notable for the following components:      Result Value   pO2, Ven <31.0 (*)    All other components within normal limits  COMPREHENSIVE METABOLIC PANEL - Abnormal; Notable for the following components:   Glucose, Bld  136 (*)    Creatinine, Ser 1.27 (*)    GFR calc non Af Amer 48 (*)    GFR calc Af Amer 56 (*)    All other components within normal limits  BRAIN NATRIURETIC PEPTIDE - Abnormal; Notable for the following components:   B Natriuretic Peptide 1,424.7 (*)    All other components within normal limits  FIBRIN DERIVATIVES D-DIMER ([facility] ONLY) - Abnormal; Notable for the following components:   Fibrin derivatives D-dimer ([facility]) 617.32 (*)    All other components within normal limits  TROPONIN I (HIGH SENSITIVITY) - Abnormal; Notable for the following components:   Troponin I (High Sensitivity) 50 (*)    All other components within normal limits  SARS CORONAVIRUS 2 BY RT PCR (HOSPITAL ORDER, PERFORMED IN [facility])  CBC WITH DIFFERENTIAL/PLATELET  MAGNESIUM  PROCALCITONIN   ____________________________________________  EKG  Sinus tachycardia with a ventricular rate of 110, normal axis, unremarkable intervals, right bundle branch block, left anterior fascicle block, T wave inversions in leads I, aVL, V2, nonspecific ST changes in inferior and anterior leads with poor baseline tracing throughout. ____________________________________________  RADIOLOGY  ED MD interpretation: Bilateral pulmonary edema.  No pneumothorax, effusion, focal consolidation.  Official radiology report(s): DG Chest 1 View  Result Date: 06/28/2020 CLINICAL DATA:  Shortness of breath for 2 weeks EXAM: CHEST  1 VIEW COMPARISON:  06/18/2019 FINDINGS: Cardiac shadow is stable. Aortic calcifications are noted. Diffuse bilateral infiltrates are identified. No sizable  effusion is seen. No bony abnormality is noted. IMPRESSION: New diffuse bilateral infiltrates consistent with multifocal pneumonia. Electronically Signed   By: Alcide Clever M.D.   On: 06/28/2020 01:57    ____________________________________________   PROCEDURES  Procedure(s) performed (including Critical Care):  .Critical Care Performed by: Gilles Chiquito, MD Authorized by: Gilles Chiquito, MD   Critical care provider statement:    Critical care time (minutes):  75   Critical care time was exclusive of:  Separately billable procedures and treating other patients   Critical care was necessary to treat or prevent imminent or life-threatening deterioration of the following conditions:  Respiratory failure and cardiac failure   Critical care was time spent personally by me on the following activities:  Evaluation of patient's response to treatment, examination of patient, ordering and performing treatments and interventions, ordering and review of laboratory studies, ordering and review of radiographic studies, pulse oximetry, re-evaluation of patient's condition, obtaining history from patient or surrogate and review of old charts     ____________________________________________   INITIAL IMPRESSION / ASSESSMENT AND PLAN / ED COURSE        Patient presents with [place] to history exam for assessment of progressively worsening shortness of breath associate with cough and blood-tinged sputum associated with acute hypoxic respiratory failure requiring 4 L nasal cannula to obtain SPO2 saturations of 92%.  Patient is otherwise noted to be tachycardic and tachypneic with otherwise stable vital signs.  Exam as above.  Differential includes PE, ACS, Covid, pneumonia, symptomatic anemia, heart failure, and actually metabolic derangements.  Low suspicion for PE as patient's D-dimer is in age-adjusted limits.  No significant metabolic or electrolyte derangements identified on CMP.  CBC is  unremarkable.  No fever, elevated white blood cell count, or focal consolidation on chest x-ray to suggest acute infectious pneumonia.  Covid PCR is negative.  Procalcitonin is less than 0.1.  Presentation is not consistent with dissection.  ECG does show some nonspecific findings and patient is known to have  an elevated troponin although given patient appears volume overloaded with a significant elevated BNP is likely demand ischemia.  Concern for acute heart failure exacerbation.  Patient given 80 of Lasix and ASA.  Will plan to admit to hospitalist service for further evaluation management.  ______________________________________   FINAL CLINICAL IMPRESSION(S) / ED DIAGNOSES  Final diagnoses:  Acute respiratory failure with hypoxia (HCC)  Elevated brain natriuretic peptide (BNP) level  Cough  Troponin I above reference range  RBBB  Abnormal ECG  Acute on chronic heart failure, unspecified heart failure type (HCC)    Medications  furosemide (LASIX) injection 80 mg (80 mg Intravenous Given 06/28/20 0255)  aspirin EC tablet 325 mg (325 mg Oral Given 06/28/20 0256)     ED Discharge Orders    None       Note:  This document was prepared using Dragon voice recognition software and may include unintentional dictation errors.   Gilles Chiquito, MD 06/28/20 [phone]

## 2020-06-28 NOTE — Progress Notes (Signed)
*  PRELIMINARY RESULTS* Echocardiogram 2D Echocardiogram has been performed.  Jeffery Smith 06/28/2020, 1:48 PM

## 2020-06-28 NOTE — ED Notes (Signed)
Dr. Para March Messaged and notified of troponin of 75

## 2020-06-28 NOTE — Progress Notes (Signed)
  Heart Failure Nurse Navigator Note   Initial Visit    Patient presented with approximately 2 week history of progressively worsening SOB.  Coughing and expectorating pink tinged sputum.  O2 saturations in th ED on room air of 85%.  HFpEF-EF 60-65% by echocardiogram in 07/2019.  Comorbidities:  Moderate to severe aortic stenosis CKD stage 3 Osteoarthritis Coronary artery disease  Medications:  Furosemide 40 mg IV BID Lisinopril 2.5 mg daily  Labs:  Sodium 138, potassium 4.3, BUN 15, creatinine 1.27, WBC 10.0. Troponin 75, 50. BNP 1424.  Weight 79.4, BMI 23.09  Intake- not completed Output-1,350 ml   Exam:  General, he is awake and alert. HEENT- pupil are equal and reactive to light. HOH, (wears hearing aids but does not have them at this time) Neck- no JVD Cardiac-heart tones with regular rate and rhythm. Respiratory-breath sounds diminished in the bases to posterior auscultation.  No wheezing noted. Skin-pink warm and dry. Extremities-1+ lower extremity edema. Neuro-intact. Speech is clear. Moves all extremities. Psych-pleasant and appropriate.     Spoke with the patient today.  He states he drinks between a quart and one half to two quarts daily.  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  He also takes care of 5 bird dogs.  Spoke with wife and patient, given heart failure teaching booklet and magnet.  Discussed zones, signs and symptoms and when to call doctor.   Will continue to follow.  Although he states he is ready to go home now.  Tresa Endo  RN,CHFN                                                                    Cardiac-heart tones of regular rate and rhythm. Respiratory-breath sounds diminished in the bases to posterior ausculation.   No wheezes noted.

## 2020-06-28 NOTE — ED Triage Notes (Signed)
Pt came in brought by Ems c/o of worsening shortness of breath for 2 weeks. Pt's wife reports that he coughed up pink sputum. Pt had no hx of CHF or MI. EMS gave 1" Nitro paced. ER provider removed.

## 2020-06-28 NOTE — ED Notes (Signed)
Pt placed on external urinary catheter, hooked to suction.

## 2020-06-28 NOTE — Consult Note (Signed)
Jeffery Smith is a 86 y.o. male  [id number]  Primary Cardiologist: Adrian Blackwater Reason for Consultation: CHF  HPI: This is a 86 year old white male with a past medical history of carotid disease presented to the hospital with shortness of breath orthopnea PND and leg swelling.  His be BNP was elevated thus I was asked to evaluate the patient for possible CHF.   Review of Systems: Denies any chest pain   Past Medical History:  Diagnosis Date  . Arthritis   . Benign head tremor   . Carotid artery occlusion    right side   . Dysrhythmia    irregular heart beat per patient- skips a beat   . Enlarged prostate   . GERD (gastroesophageal reflux disease)   . Hard of hearing   . Hyperlipidemia   . Sore throat     Medications Prior to Admission  Medication Sig Dispense Refill  . acetaminophen (TYLENOL) 325 MG tablet Take 650 mg by mouth every 6 (six) hours as needed for mild pain, fever or headache.    . Apoaequorin (PREVAGEN EXTRA STRENGTH PO) Take 1 tablet by mouth every evening. Chewables    . aspirin EC 81 MG tablet Take 1 tablet (81 mg total) by mouth daily.    . cholecalciferol (VITAMIN D3) 25 MCG (1000 UNIT) tablet Take 1,000 Units by mouth daily.    . diphenhydrAMINE (BENADRYL) 25 MG tablet Take 25 mg by mouth daily.    . fluticasone (FLONASE) 50 MCG/ACT nasal spray Place 2 sprays into both nostrils daily.    Marland Kitchen ipratropium (ATROVENT) 0.06 % nasal spray Place 2 sprays into both nostrils at bedtime.    Marland Kitchen omeprazole (PRILOSEC) 20 MG capsule Take 20 mg by mouth at bedtime.     . furosemide (LASIX) 20 MG tablet Take 1 tablet (20 mg total) by mouth as needed for edema (leg swelling). 30 tablet 6     . enoxaparin (LOVENOX) injection  40 mg Subcutaneous Q24H  . furosemide  40 mg Intravenous Q12H  . levofloxacin  500 mg Oral Daily  . lisinopril  2.5 mg Oral Daily  . sodium chloride flush  3 mL Intravenous Q12H    Infusions: . sodium chloride      Allergies   Allergen Reactions  . Horse-Derived Products Anaphylaxis    Social History   Socioeconomic History  . Marital status: Married    Spouse name: Not on file  . Number of children: Not on file  . Years of education: Not on file  . Highest education level: Not on file  Occupational History  . Not on file  Tobacco Use  . Smoking status: Never Smoker  . Smokeless tobacco: Never Used  Vaping Use  . Vaping Use: Never used  Substance and Sexual Activity  . Alcohol use: Yes    Comment: 1 glass of wine daily  . Drug use: No  . Sexual activity: Not on file  Other Topics Concern  . Not on file  Social History Narrative  . Not on file   Social Determinants of Health   Financial Resource Strain:   . Difficulty of Paying Living Expenses: Not on file  Food Insecurity:   . Worried About Programme researcher, broadcasting/film/video in the Last Year: Not on file  . Ran Out of Food in the Last Year: Not on file  Transportation Needs:   . Lack of Transportation (Medical): Not on file  . Lack of Transportation (Non-Medical): Not on  file  Physical Activity:   . Days of Exercise per Week: Not on file  . Minutes of Exercise per Session: Not on file  Stress:   . Feeling of Stress : Not on file  Social Connections:   . Frequency of Communication with Friends and Family: Not on file  . Frequency of Social Gatherings with Friends and Family: Not on file  . Attends Religious Services: Not on file  . Active Member of Clubs or Organizations: Not on file  . Attends Banker Meetings: Not on file  . Marital Status: Not on file  Intimate Partner Violence:   . Fear of Current or Ex-Partner: Not on file  . Emotionally Abused: Not on file  . Physically Abused: Not on file  . Sexually Abused: Not on file    Family History  Problem Relation Age of Onset  . Cancer Mother   . Cancer Brother     PHYSICAL EXAM: Vitals:   06/28/20 1100 06/28/20 1130  BP: 111/74 99/82  Pulse: (!) 41 (!) 44  Resp: 18 20   Temp:    SpO2: 95% 94%     Intake/Output Summary (Last 24 hours) at 06/28/2020 1236 Last data filed at 06/28/2020 0646 Gross per 24 hour  Intake --  Output 1350 ml  Net -1350 ml    General:  Well appearing. No respiratory difficulty HEENT: normal Neck: supple. no JVD. Carotids 2+ bilat; no bruits. No lymphadenopathy or thryomegaly appreciated. Cor: PMI nondisplaced. Regular rate & rhythm. No rubs, gallops or murmurs. Lungs: clear Abdomen: soft, nontender, nondistended. No hepatosplenomegaly. No bruits or masses. Good bowel sounds. Extremities: no cyanosis, clubbing, rash, edema Neuro: alert & oriented x 3, cranial nerves grossly intact. moves all 4 extremities w/o difficulty. Affect pleasant.  ECG: Sinus rhythm with frequent ectopy with right bundle branch block and LVH  Results for orders placed or performed during the hospital encounter of 06/28/20 (from the past 24 hour(s))  SARS Coronavirus 2 by RT PCR (hospital order, performed in [facility] hospital lab) Nasopharyngeal Nasopharyngeal Swab     Status: None   Collection Time: 06/28/20  1:31 AM   Specimen: Nasopharyngeal Swab  Result Value Ref Range   SARS Coronavirus 2 NEGATIVE NEGATIVE  Blood gas, venous     Status: Abnormal   Collection Time: 06/28/20  1:31 AM  Result Value Ref Range   pH, Ven 7.32 7.25 - 7.43   pCO2, Ven 50 44 - 60 mmHg   pO2, Ven <31.0 (LL) 32 - 45 mmHg   Bicarbonate 25.8 20.0 - 28.0 mmol/L   Acid-base deficit 1.0 0.0 - 2.0 mmol/L   O2 Saturation 39.2 %   Patient temperature 37.0    Collection site LINE    Sample type VENOUS   CBC with Differential     Status: None   Collection Time: 06/28/20  1:31 AM  Result Value Ref Range   WBC 10.0 4.0 - 10.5 K/uL   RBC 4.28 4.22 - 5.81 MIL/uL   Hemoglobin 13.9 13.0 - 17.0 g/dL   HCT 40.9 39 - 52 %   MCV 98.6 80.0 - 100.0 fL   MCH 32.5 26.0 - 34.0 pg   MCHC 32.9 30.0 - 36.0 g/dL   RDW 81.1 91.4 - 78.2 %   Platelets 194 150 - 400 K/uL   nRBC 0.0 0.0 -  0.2 %   Neutrophils Relative % 74 %   Neutro Abs 7.3 1.7 - 7.7 K/uL   Lymphocytes Relative  14 %   Lymphs Abs 1.4 0.7 - 4.0 K/uL   Monocytes Relative 10 %   Monocytes Absolute 1.0 0 - 1 K/uL   Eosinophils Relative 1 %   Eosinophils Absolute 0.1 0 - 0 K/uL   Basophils Relative 1 %   Basophils Absolute 0.1 0 - 0 K/uL   Immature Granulocytes 0 %   Abs Immature Granulocytes 0.04 0.00 - 0.07 K/uL  Comprehensive metabolic panel     Status: Abnormal   Collection Time: 06/28/20  1:31 AM  Result Value Ref Range   Sodium 138 135 - 145 mmol/L   Potassium 4.3 3.5 - 5.1 mmol/L   Chloride 105 98 - 111 mmol/L   CO2 24 22 - 32 mmol/L   Glucose, Bld 136 (H) 70 - 99 mg/dL   BUN 15 8 - 23 mg/dL   Creatinine, Ser 8.56 (H) 0.61 - 1.24 mg/dL   Calcium 9.0 8.9 - 31.4 mg/dL   Total Protein 6.8 6.5 - 8.1 g/dL   Albumin 3.9 3.5 - 5.0 g/dL   AST 20 15 - 41 U/L   ALT 17 0 - 44 U/L   Alkaline Phosphatase 67 38 - 126 U/L   Total Bilirubin 0.9 0.3 - 1.2 mg/dL   GFR calc non Af Amer 48 (L) >60 mL/min   GFR calc Af Amer 56 (L) >60 mL/min   Anion gap 9 5 - 15  Brain natriuretic peptide     Status: Abnormal   Collection Time: 06/28/20  1:31 AM  Result Value Ref Range   B Natriuretic Peptide 1,424.7 (H) 0.0 - 100.0 pg/mL  Troponin I (High Sensitivity)     Status: Abnormal   Collection Time: 06/28/20  1:31 AM  Result Value Ref Range   Troponin I (High Sensitivity) 50 (H) <18 ng/L  Magnesium     Status: None   Collection Time: 06/28/20  1:31 AM  Result Value Ref Range   Magnesium 2.1 1.7 - 2.4 mg/dL  Procalcitonin - Baseline     Status: None   Collection Time: 06/28/20  1:31 AM  Result Value Ref Range   Procalcitonin <0.10 ng/mL  Fibrin derivatives D-Dimer ([facility] only)     Status: Abnormal   Collection Time: 06/28/20  1:31 AM  Result Value Ref Range   Fibrin derivatives D-dimer ([facility]) 617.32 (H) 0.00 - 499.00 ng/mL (FEU)  Troponin I (High Sensitivity)     Status: Abnormal   Collection Time: 06/28/20   3:24 AM  Result Value Ref Range   Troponin I (High Sensitivity) 75 (H) <18 ng/L   DG Chest 1 View  Result Date: 06/28/2020 CLINICAL DATA:  Shortness of breath for 2 weeks EXAM: CHEST  1 VIEW COMPARISON:  06/18/2019 FINDINGS: Cardiac shadow is stable. Aortic calcifications are noted. Diffuse bilateral infiltrates are identified. No sizable effusion is seen. No bony abnormality is noted. IMPRESSION: New diffuse bilateral infiltrates consistent with multifocal pneumonia. Electronically Signed   By: Alcide Clever M.D.   On: 06/28/2020 01:57     ASSESSMENT AND PLAN: Congestive heart failure with chest x-ray show also showing some infiltrate and possible sepsis for which blood cultures were being done.  Start the patient on Lasix IV and get her echocardiogram to evaluate whether patient has any LV dysfunction.  Will make further recommendation after evaluating the echocardiogram.  Christana Angelica A

## 2020-06-28 NOTE — ED Notes (Signed)
ED TO INPATIENT HANDOFF REPORT  ED Nurse Name and Phone #: [phone] S Name/Age/Gender [name] 86 y.o. male Room/Bed: ED13A/ED13A  Code Status   Code Status: Full Code  Home/SNF/Other Home Patient oriented to: self, place, time and situation Is this baseline? Yes   Triage Complete: Triage complete  Chief Complaint Acute CHF (congestive heart failure) (HCC) [I50.9]  Triage Note Pt came in brought by Ems c/o of worsening shortness of breath for 2 weeks. Pt's wife reports that he coughed up pink sputum. Pt had no hx of CHF or MI. EMS gave 1" Nitro paced. ER provider removed.     Allergies Allergies  Allergen Reactions  . Horse-Derived Products Anaphylaxis    Level of Care/Admitting Diagnosis ED Disposition    ED Disposition Condition Comment   Admit  Hospital Area: [facility] [[id number]]  Level of Care: Progressive Cardiac [106]  Admit to Progressive based on following criteria: CARDIOVASCULAR & THORACIC of moderate stability with acute coronary syndrome symptoms/low risk myocardial infarction/hypertensive urgency/arrhythmias/heart failure potentially compromising stability and stable post cardiovascular intervention patients.  Covid Evaluation: Asymptomatic Screening Protocol (No Symptoms)  Diagnosis: Acute CHF (congestive heart failure) [facility]) [[id number]]  Admitting Physician: Andris Baumann [[id number]]  Attending Physician: Andris Baumann [[id number]]  Estimated length of stay: past midnight tomorrow  Certification:: I certify this patient will need inpatient services for at least 2 midnights       B Medical/Surgery History Past Medical History:  Diagnosis Date  . Arthritis   . Benign head tremor   . Carotid artery occlusion    right side   . Dysrhythmia    irregular heart beat per patient- skips a beat   . Enlarged prostate   . GERD (gastroesophageal reflux disease)   . Hard of hearing   . Hyperlipidemia   . Sore throat    Past  Surgical History:  Procedure Laterality Date  . CATARACT EXTRACTION     left  . ENDARTERECTOMY  11/03/2011   Procedure: ENDARTERECTOMY CAROTID;  Surgeon: Larina Earthly, MD;  Location: [facility] OR;  Service: Vascular;  Laterality: Right;  Right Carotid endarterectomy with patch angiopolasty  . TONSILLECTOMY    . TOTAL KNEE ARTHROPLASTY     left  . TOTAL KNEE ARTHROPLASTY Right 09/21/2014   Procedure: RIGHT TOTAL KNEE ARTHROPLASTY;  Surgeon: Loanne Drilling, MD;  Location: [facility] ORS;  Service: Orthopedics;  Laterality: Right;     A IV Location/Drains/Wounds Patient Lines/Drains/Airways Status    Active Line/Drains/Airways    Name Placement date Placement time Site Days   Peripheral IV 07/16/19 Left Antecubital 07/16/19  0925  Antecubital  348   Peripheral IV 06/28/20 Anterior;Left Forearm 06/28/20  --  Forearm  less than 1   Closed System Drain Right Knee Accordion (Hemovac) 10 Fr. 09/21/14  0910  Knee  2107   Incision (Closed) 09/21/14 Knee Right 09/21/14  0829   2107          Intake/Output Last 24 hours  Intake/Output Summary (Last 24 hours) at 06/28/2020 0855 Last data filed at 06/28/2020 9326 Gross per 24 hour  Intake --  Output 1350 ml  Net -1350 ml    Labs/Imaging Results for orders placed or performed during the hospital encounter of 06/28/20 (from the past 48 hour(s))  SARS Coronavirus 2 by RT PCR (hospital order, performed in [facility] hospital lab) Nasopharyngeal Nasopharyngeal Swab     Status: None   Collection Time: 06/28/20  1:31 AM  Specimen: Nasopharyngeal Swab  Result Value Ref Range   SARS Coronavirus 2 NEGATIVE NEGATIVE    Comment: (NOTE) SARS-CoV-2 target nucleic acids are NOT DETECTED.  The SARS-CoV-2 RNA is generally detectable in upper and lower respiratory specimens during the acute phase of infection. The lowest concentration of SARS-CoV-2 viral copies this assay can detect is 250 copies / mL. A negative result does not preclude SARS-CoV-2 infection and  should not be used as the sole basis for treatment or other patient management decisions.  A negative result may occur with improper specimen collection / handling, submission of specimen other than nasopharyngeal swab, presence of viral mutation(s) within the areas targeted by this assay, and inadequate number of viral copies (<250 copies / mL). A negative result must be combined with clinical observations, patient history, and epidemiological information.  Fact Sheet for Patients:   BoilerBrush.com.cy  Fact Sheet for Healthcare Providers: https://pope.com/  This test is not yet approved or  cleared by the [place] FDA and has been authorized for detection and/or diagnosis of SARS-CoV-2 by FDA under an Emergency Use Authorization (EUA).  This EUA will remain in effect (meaning this test can be used) for the duration of the COVID-19 declaration under Section 564(b)(1) of the Act, 21 U.S.C. section 360bbb-3(b)(1), unless the authorization is terminated or revoked sooner.  Performed at [facility], [address] [place], [place] [address]   Blood gas, venous     Status: Abnormal   Collection Time: 06/28/20  1:31 AM  Result Value Ref Range   pH, Ven 7.32 7.25 - 7.43   pCO2, Ven 50 44 - 60 mmHg   pO2, Ven <31.0 (LL) 32 - 45 mmHg   Bicarbonate 25.8 20.0 - 28.0 mmol/L   Acid-base deficit 1.0 0.0 - 2.0 mmol/L   O2 Saturation 39.2 %   Patient temperature 37.0    Collection site LINE    Sample type VENOUS     Comment: Performed at [facility], [address] [place], [place] [address]  CBC with Differential     Status: None   Collection Time: 06/28/20  1:31 AM  Result Value Ref Range   WBC 10.0 4.0 - 10.5 K/uL   RBC 4.28 4.22 - 5.81 MIL/uL   Hemoglobin 13.9 13.0 - 17.0 g/dL   HCT 56.2 39 - 52 %   MCV 98.6 80.0 - 100.0 fL   MCH 32.5 26.0 - 34.0 pg   MCHC 32.9 30.0 - 36.0 g/dL   RDW 13.0 86.5 - 78.4 %    Platelets 194 150 - 400 K/uL   nRBC 0.0 0.0 - 0.2 %   Neutrophils Relative % 74 %   Neutro Abs 7.3 1.7 - 7.7 K/uL   Lymphocytes Relative 14 %   Lymphs Abs 1.4 0.7 - 4.0 K/uL   Monocytes Relative 10 %   Monocytes Absolute 1.0 0 - 1 K/uL   Eosinophils Relative 1 %   Eosinophils Absolute 0.1 0 - 0 K/uL   Basophils Relative 1 %   Basophils Absolute 0.1 0 - 0 K/uL   Immature Granulocytes 0 %   Abs Immature Granulocytes 0.04 0.00 - 0.07 K/uL    Comment: Performed at [facility], [address] [place], [place] [address]  Comprehensive metabolic panel     Status: Abnormal   Collection Time: 06/28/20  1:31 AM  Result Value Ref Range   Sodium 138 135 - 145 mmol/L   Potassium 4.3 3.5 - 5.1 mmol/L  Chloride 105 98 - 111 mmol/L   CO2 24 22 - 32 mmol/L   Glucose, Bld 136 (H) 70 - 99 mg/dL    Comment: Glucose reference range applies only to samples taken after fasting for at least 8 hours.   BUN 15 8 - 23 mg/dL   Creatinine, Ser 0.35 (H) 0.61 - 1.24 mg/dL   Calcium 9.0 8.9 - 46.5 mg/dL   Total Protein 6.8 6.5 - 8.1 g/dL   Albumin 3.9 3.5 - 5.0 g/dL   AST 20 15 - 41 U/L   ALT 17 0 - 44 U/L   Alkaline Phosphatase 67 38 - 126 U/L   Total Bilirubin 0.9 0.3 - 1.2 mg/dL   GFR calc non Af Amer 48 (L) >60 mL/min   GFR calc Af Amer 56 (L) >60 mL/min   Anion gap 9 5 - 15    Comment: Performed at [facility], [address] [place], [place] [address]  Brain natriuretic peptide     Status: Abnormal   Collection Time: 06/28/20  1:31 AM  Result Value Ref Range   B Natriuretic Peptide 1,424.7 (H) 0.0 - 100.0 pg/mL    Comment: Performed at [facility], [address] [place], [place] [address]  Troponin I (High Sensitivity)     Status: Abnormal   Collection Time: 06/28/20  1:31 AM  Result Value Ref Range   Troponin I (High Sensitivity) 50 (H) <18 ng/L    Comment: (NOTE) Elevated high sensitivity troponin I (hsTnI) values and significant  changes across serial  measurements may suggest ACS but many other  chronic and acute conditions are known to elevate hsTnI results.  Refer to the "Links" section for chest pain algorithms and additional  guidance. Performed at [facility], [address] [place], [place] [address]   Magnesium     Status: None   Collection Time: 06/28/20  1:31 AM  Result Value Ref Range   Magnesium 2.1 1.7 - 2.4 mg/dL    Comment: Performed at [facility], [address] [place], [place] [address]  Procalcitonin - Baseline     Status: None   Collection Time: 06/28/20  1:31 AM  Result Value Ref Range   Procalcitonin <0.10 ng/mL    Comment:        Interpretation: PCT (Procalcitonin) <= 0.5 ng/mL: Systemic infection (sepsis) is not likely. Local bacterial infection is possible. (NOTE)       Sepsis PCT Algorithm           Lower Respiratory Tract                                      Infection PCT Algorithm    ----------------------------     ----------------------------         PCT < 0.25 ng/mL                PCT < 0.10 ng/mL          Strongly encourage             Strongly discourage   discontinuation of antibiotics    initiation of antibiotics    ----------------------------     -----------------------------       PCT 0.25 - 0.50 ng/mL            PCT 0.10 - 0.25 ng/mL  OR       >80% decrease in PCT            Discourage initiation of                                            antibiotics      Encourage discontinuation           of antibiotics    ----------------------------     -----------------------------         PCT >= 0.50 ng/mL              PCT 0.26 - 0.50 ng/mL               AND        <80% decrease in PCT             Encourage initiation of                                             antibiotics       Encourage continuation           of antibiotics    ----------------------------     -----------------------------        PCT >= 0.50 ng/mL                  PCT > 0.50 ng/mL                AND         increase in PCT                  Strongly encourage                                      initiation of antibiotics    Strongly encourage escalation           of antibiotics                                     -----------------------------                                           PCT <= 0.25 ng/mL                                                 OR                                        > 80% decrease in PCT                                      Discontinue / Do not initiate  antibiotics  Performed at [facility], [address] [place], [place] [address]   Fibrin derivatives D-Dimer [facility] only)     Status: Abnormal   Collection Time: 06/28/20  1:31 AM  Result Value Ref Range   Fibrin derivatives D-dimer ([facility]) 617.32 (H) 0.00 - 499.00 ng/mL (FEU)    Comment: (NOTE) <> Exclusion of Venous Thromboembolism (VTE) - OUTPATIENT ONLY   (Emergency Department or [place])    0-499 ng/ml (FEU): With a low to intermediate pretest probability                      for VTE this test result excludes the diagnosis                      of VTE.   >499 ng/ml (FEU) : VTE not excluded; additional work up for VTE is                      required.  <> Testing on Inpatients and Evaluation of Disseminated Intravascular   Coagulation (DIC) Reference Range:   0-499 ng/ml (FEU) Performed at [facility], [address] [place], [place] [address]   Troponin I (High Sensitivity)     Status: Abnormal   Collection Time: 06/28/20  3:24 AM  Result Value Ref Range   Troponin I (High Sensitivity) 75 (H) <18 ng/L    Comment: RESULT CALLED TO, READ BACK AND VERIFIED WITH ASHTON PETERS ON 06/28/20 AT 0408 BY JAG (NOTE) Elevated high sensitivity troponin I (hsTnI) values and significant  changes across serial measurements may suggest ACS but many other  chronic and acute conditions are known to elevate hsTnI results.  Refer  to the "Links" section for chest pain algorithms and additional  guidance. Performed at [facility], [address] [place], [place] [address]    DG Chest 1 View  Result Date: 06/28/2020 CLINICAL DATA:  Shortness of breath for 2 weeks EXAM: CHEST  1 VIEW COMPARISON:  06/18/2019 FINDINGS: Cardiac shadow is stable. Aortic calcifications are noted. Diffuse bilateral infiltrates are identified. No sizable effusion is seen. No bony abnormality is noted. IMPRESSION: New diffuse bilateral infiltrates consistent with multifocal pneumonia. Electronically Signed   By: Alcide CleverMark  Lukens M.D.   On: 06/28/2020 01:57    Pending Labs Unresulted Labs (From admission, onward)          Start     Ordered   07/05/20 0500  Creatinine, serum  (enoxaparin (LOVENOX)    CrCl >/= 30 ml/min)  Weekly,   STAT     Comments: while on enoxaparin therapy    06/28/20 0325   06/29/20 0500  Basic metabolic panel  Daily,   STAT      06/28/20 0325   06/29/20 0500  Brain natriuretic peptide  Daily,   STAT      06/28/20 0720          Vitals/Pain Today's Vitals   06/28/20 0650 06/28/20 0700 06/28/20 0730 06/28/20 0800  BP:  119/70 116/87 107/72  Pulse: 73 90 82 (!) 46  Resp: (!) 24 19 19 19   Temp:      TempSrc:      SpO2: 91% 93% 93% 94%  Weight:      Height:      PainSc:        Isolation Precautions No active isolations  Medications Medications  sodium chloride flush (NS) 0.9 % injection 3 mL (3 mLs Intravenous  Given 06/28/20 0329)  sodium chloride flush (NS) 0.9 % injection 3 mL (has no administration in time range)  0.9 %  sodium chloride infusion (has no administration in time range)  acetaminophen (TYLENOL) tablet 650 mg (has no administration in time range)  ondansetron (ZOFRAN) injection 4 mg (has no administration in time range)  enoxaparin (LOVENOX) injection 40 mg (has no administration in time range)  furosemide (LASIX) injection 40 mg ( Intravenous Canceled Entry 06/28/20 0517)   lisinopril (ZESTRIL) tablet 2.5 mg (has no administration in time range)  levofloxacin (LEVAQUIN) tablet 500 mg (has no administration in time range)  furosemide (LASIX) injection 80 mg (80 mg Intravenous Given 06/28/20 0255)  aspirin EC tablet 325 mg (325 mg Oral Given 06/28/20 0256)    Mobility walks with person assist     Focused Assessments    R Recommendations: See Admitting Provider Note  Report given to:

## 2020-06-28 NOTE — ED Notes (Signed)
Pt depends saturated with urine. Pt cleaned and new linens and depends placed.

## 2020-06-29 LAB — BRAIN NATRIURETIC PEPTIDE: B Natriuretic Peptide: 1198.1 pg/mL — ABNORMAL HIGH (ref 0.0–100.0)

## 2020-06-29 LAB — BASIC METABOLIC PANEL
Anion gap: 8 (ref 5–15)
BUN: 19 mg/dL (ref 8–23)
CO2: 27 mmol/L (ref 22–32)
Calcium: 8.9 mg/dL (ref 8.9–10.3)
Chloride: 102 mmol/L (ref 98–111)
Creatinine, Ser: 1.24 mg/dL (ref 0.61–1.24)
GFR calc Af Amer: 58 mL/min — ABNORMAL LOW (ref >60)
GFR calc non Af Amer: 50 mL/min — ABNORMAL LOW (ref >60)
Glucose, Bld: 96 mg/dL (ref 70–99)
Potassium: 3.9 mmol/L (ref 3.5–5.1)
Sodium: 137 mmol/L (ref 135–145)

## 2020-06-29 LAB — ALBUMIN: Albumin: 3.3 g/dL — ABNORMAL LOW (ref 3.5–5.0)

## 2020-06-29 MED ORDER — CARVEDILOL 3.125 MG PO TABS
3.1250 mg | ORAL_TABLET | Freq: Two times a day (BID) | ORAL | Status: DC
  Administered 2020-06-29 – 2020-06-30 (×2): 3.125 mg via ORAL
  Filled 2020-06-29 (×2): qty 1

## 2020-06-29 MED ORDER — LOSARTAN POTASSIUM 25 MG PO TABS
25.0000 mg | ORAL_TABLET | Freq: Every day | ORAL | Status: DC
  Administered 2020-06-29 – 2020-06-30 (×2): 25 mg via ORAL
  Filled 2020-06-29 (×2): qty 1

## 2020-06-29 NOTE — Progress Notes (Addendum)
   06/29/20 0044  Assess: MEWS Score  Temp 98.2 F (36.8 C)  BP (!) 71/37  Pulse Rate 85  Resp 20  SpO2 90 %  O2 Device Room Air   BP has been running low throughout night. Reyes Ivan NP has been notified of BPs of 80s- 90s. BP of 71/37 rechecked, not accurate. Morning Lasix has not been given.

## 2020-06-29 NOTE — Progress Notes (Addendum)
  Heart Failure Nurse Navigator Note  HFrEF-35-40% EF by echocardiogram completed on this admission.  Previously reported EF of 60-65% in 07/2019.  Patient presented by the way of EMS to the ED with 2 week history of progressively worsening SOB, coughing and expectorating pink tinged sputum.  02 saturations on room air, 85%.   Comorbidities:  Moderate to severe Aortic Stenosis CKD stage 3 Osteoarthritis Coronary artery disease  Medications:  Lasix -on hold Lisinopril 2.5 mg daily-discontinued  today Losartan 25 mg daily-started today Coreg 3.125 mg BID Labs:  Sodium 137, potassium 3.7, BUN 19, Creatinine 1.24 (1.27 yesterday), BNP 1,198 (yesterday 1,424)  BP ranging from 71/37 to 111/74.  Lasix on hold, spoke with nurse she plans to check orthostatic BP before administering.  Weight-76.9 kg ( 77.5 yesterday)  Intake - none recorded Output- 350 ml   Assessment:  He is awake and alert, sitting up in bed on room air saturations 92%.  He denies any chest pain, pressure, dizziness or light headedness.  HEENT- pupils are equal and react to light.  Neck- no JVD  Cardiac-heart tones of regular rate and rhythm.  No murmurs or ribs.  Chest- breath sounds are clear to posterior auscultation.  Gi-stomach is rounded and soft.  Good bowel sounds.  Musculoskeletal-moves all four extremities without difficulty.  Trace edema lower extremities.  Neuro- speech is clear, tongue midline.  Psych- is pleasant and appropriate.   Teaching:  Spent time with patient and wife, discussing the function of his heart, instructed on low sodium (1500 to 2000 mg )diet, also instructed on fluid restruction, explained how sodium and extra fluid is not benifical  for his condition, making his heart work harder.  Stressed the importance of weighing daily, recording.  Also to notify Dr of weight gain of 2 pounds over night or 5 pounds within the week.  Also discussed sign and symptom changes to  report, as noted on heart failure magnet.     Tresa Endo RN,CHFN

## 2020-06-29 NOTE — Progress Notes (Signed)
SUBJECTIVE: Patient resting comfortably.  Denies chest pain.  States his breathing has much improved is currently stable on room air.   Vitals:   06/29/20 0305 06/29/20 0404 06/29/20 0612 06/29/20 0809  BP: 94/70  (!) 82/61 103/78  Pulse: 82  77 60  Resp: 16   19  Temp: 98 F (36.7 C)   97.9 F (36.6 C)  TempSrc: Oral   Oral  SpO2: 91%   94%  Weight:  76.9 kg    Height:        Intake/Output Summary (Last 24 hours) at 06/29/2020 1041 Last data filed at 06/29/2020 0300 Gross per 24 hour  Intake 0 ml  Output 350 ml  Net -350 ml    LABS: Basic Metabolic Panel: Recent Labs    06/28/20 0131 06/29/20 0426  NA 138 137  K 4.3 3.9  CL 105 102  CO2 24 27  GLUCOSE 136* 96  BUN 15 19  CREATININE 1.27* 1.24  CALCIUM 9.0 8.9  MG 2.1  --    Liver Function Tests: Recent Labs    06/28/20 0131 06/29/20 0426  AST 20  --   ALT 17  --   ALKPHOS 67  --   BILITOT 0.9  --   PROT 6.8  --   ALBUMIN 3.9 3.3*   No results for input(s): LIPASE, AMYLASE in the last 72 hours. CBC: Recent Labs    06/28/20 0131  WBC 10.0  NEUTROABS 7.3  HGB 13.9  HCT 42.2  MCV 98.6  PLT 194   Cardiac Enzymes: No results for input(s): CKTOTAL, CKMB, CKMBINDEX, TROPONINI in the last 72 hours. BNP: Invalid input(s): POCBNP D-Dimer: No results for input(s): DDIMER in the last 72 hours. Hemoglobin A1C: No results for input(s): HGBA1C in the last 72 hours. Fasting Lipid Panel: No results for input(s): CHOL, HDL, LDLCALC, TRIG, CHOLHDL, LDLDIRECT in the last 72 hours. Thyroid Function Tests: No results for input(s): TSH, T4TOTAL, T3FREE, THYROIDAB in the last 72 hours.  Invalid input(s): FREET3 Anemia Panel: No results for input(s): VITAMINB12, FOLATE, FERRITIN, TIBC, IRON, RETICCTPCT in the last 72 hours.   PHYSICAL EXAM General: Well developed, well nourished, in no acute distress HEENT:  Normocephalic and atramatic Neck:  No JVD.  Lungs: Clear bilaterally to auscultation and  percussion. Heart: HRRR . Normal S1 and S2 without gallops or murmurs.  Abdomen: Bowel sounds are positive, abdomen soft and non-tender  Msk:  Back normal, normal gait. Normal strength and tone for age. Extremities: No clubbing, cyanosis or edema.   Neuro: Alert and oriented X 3. Psych:  Good affect, responds appropriately  TELEMETRY: NSR with RBBB.  82/BPM  ASSESSMENT AND PLAN: Patient presenting to the emergency department with respiratory distress.  Echocardiogram revealed new onset HFrEF with ejection fraction 35 to 40%, moderate LVH, grade 1 diastolic dysfunction, and moderate to severe aortic stenosis.  In the setting of HFrEF will start the patient on carvedilol and with moderate LVH will change lisinopril to losartan.  With a history of CKD 3, will hold on starting spironolactone at this time.  Would recommend continuing IV diuretics today and transition to oral on 9/22.  We will continue to follow.  Principal Problem:   Acute on chronic diastolic CHF (congestive heart failure) (HCC) Active Problems:   Acute respiratory failure with hypoxia (HCC)   Moderate to severe aortic stenosis   H/O carotid endarterectomy   Elevated troponin   Chronic kidney disease, stage 3a    Jeffery Kaufmann,  NP-C 06/29/2020 10:41 AM

## 2020-06-29 NOTE — Progress Notes (Signed)
Patient's BP soft but stable. 2L O2 placed on overnight due to oxygen saturation being in the 80s while sleeping. Orders to keep Oxygen saturation greater than 92%. Now meeting that criteria with the supplemental oxygen.

## 2020-06-29 NOTE — Progress Notes (Signed)
patient is demonstrating orthostatic VS, Lying BP 96/60, HR76, sitting BP 86/53, HR43. and standing BP 75/63, HR43. patient has had a short 4 beat run of VT, and is getting confused

## 2020-06-29 NOTE — Progress Notes (Signed)
PROGRESS NOTE    Patient: Jeffery Smith                            PCP: System, Provider Not In                    DOB: 02-Apr-1925            DOA: 06/28/2020 ZOX:[id number]             DOS: 06/29/2020, 11:41 AM   LOS: 1 day   Date of Service: The patient was seen and examined on 06/29/2020  Subjective:   The patient was seen and examined this morning, awake alert, pleasant Has been weaned off 4 L of oxygen, currently on room air satting 99%, hypotensive complaining of dizziness  -Nursing staff has held morning medication of lisinopril and Lasix Denies any chest pain Shortness of breath with exertion   Brief Narrative:   HPI: Jeffery Smith 86 year old male with history of diastolic heart failure, last EF 60-65 in 07/2019, carotid artery stenosis status post endarterectomy, moderate to severe aortic stenosis, CKD 3a, presenting with shortness of breath. Likely due to pneumonia, CHF exacerbation.. Acute respiratory failure hypoxemia 85% on room air. 92% on 4 L of oxygen.  ED Course:  In acute respiratory distress with increased work of breathing.  He was tachycardic at 112 tachypneic at 26 with O2 sat 92% on O2 at 4 L.  BP 157/92.  Blood work significant for troponin 50, BNP 1424, D-dimer 617, procalcitonin less than 0.1.  WBC WNL.  Creatinine 1.27 which is about his baseline.  Chest x-ray showed new diffuse bilateral infiltrates consistent with multifocal pneumonia.   Covid negative -patient is immunized EKG: Sinus tachycardia with nonspecific ST-T wave changes Patient treated with IV Lasix.     Assessment & Plan:   Principal Problem:   Acute on chronic diastolic CHF (congestive heart failure) (HCC) Active Problems:   Acute respiratory failure with hypoxia (HCC)   Moderate to severe aortic stenosis   H/O carotid endarterectomy   Elevated troponin   Chronic kidney disease, stage 3a   Acute respiratory failure with hypoxia -Continue to improve, patient has been  weaned off supplemental oxygen 4 L now to room air, satting 99% Still complaining shortness of breath with exertion This morning complaining of dizziness blood pressure low -Continue pulmonary toiletry, as needed DuoNeb -Likely exacerbated by CHF exacerbation possible pneumonia -Elevated D-dimer, VQ scan negative for pulmonary embolism 2D cardiogram reviewed consistent with d CHF, LVH, hypokinesis -Initiating empiric antibiotics -Continue O2 supplements  On admission presented with respiratory failure hypoxia satting 85% on room air Initiated on nonrebreather mask, tapered to 4 L of oxygen -Respiratory failure postoperative be due to CHF exacerbation, multifocal pneumonia  -SARS-CoV-2 negative  Hypotensive -Blood pressure as low as 71/37 >>  -Withholding Lasix, lisinopril -Orthostatic checks      Acute on chronic diastolic CHF (congestive heart failure) (HCC) -2D echocardiogram reviewed, ejection fraction 35-40%, LVH, global hypokinesis, -Cardiology following -Has been on Lasix-with the goal of diuresing -Lasix and lisinopril on hold today due to hypotension Monitoring daily weight I's and O's  - BNP: 1424.7 >> 1198 -Low-dose IV Lasix given the aortic stenosis,   No beta-blocker given acute nature  -Cardiologist consulted, following appreciate recommendations    Elevated troponin -Denies any chest pain -Troponin of 50 >> 75 -Patient had low risk nuclear stress test October 2020 -Continue aspirin and statin  Moderate to severe aortic stenosis.  Carotid stenosis -Avoid aggressive diuresis -Continue statins, aspirin   H/O carotid endarterectomy    Chronic kidney disease, stage 3a -Renal function at baseline   Nutritional status:        Cultures; Blood Cultures x 2 >> NGT   Antimicrobials: P.o. Levaquin 06/28/2020 x1 dose IV Rocephin/azithromycin >>   Consultants:  Cardiology   -------------------------------------------------------------------------------------------------------------------------------------------- DVT prophylaxis:  SCD/Compression stockings and Lovenox SQ Code Status:   Code Status: Full Code Family Communication: No family member present at bedside- attempt will be made to updatehis wife daily The above findings and plan of care has been discussed with patient (and family )  in detail,  they expressed understanding and agreement of above. -Advance care planning has been discussed.   Admission status:    Status is: Inpatient  Remains inpatient appropriate because:Inpatient level of care appropriate due to severity of illness   Dispo: The patient is from: Home              Anticipated d/c is to: Home              Anticipated d/c date is: In 1 to 2 days              Patient currently is not medically stable to d/c.        Procedures:   No admission procedures for hospital encounter.   Antimicrobials:  Anti-infectives (From admission, onward)   Start     Dose/Rate Route Frequency Ordered Stop   06/29/20 1000  azithromycin (ZITHROMAX) tablet 500 mg        500 mg Oral Daily 06/28/20 1513 07/03/20 0959   06/28/20 1600  cefTRIAXone (ROCEPHIN) 1 g in sodium chloride 0.9 % 100 mL IVPB        1 g 200 mL/hr over 30 Minutes Intravenous Every 24 hours 06/28/20 1513     06/28/20 0730  levofloxacin (LEVAQUIN) tablet 500 mg  Status:  Discontinued        500 mg Oral Daily 06/28/20 0724 06/28/20 1513       Medication:  . azithromycin  500 mg Oral Daily  . carvedilol  3.125 mg Oral BID WC  . enoxaparin (LOVENOX) injection  40 mg Subcutaneous Q24H  . furosemide  40 mg Intravenous Q12H  . losartan  25 mg Oral Daily  . sodium chloride flush  3 mL Intravenous Q12H    sodium chloride, acetaminophen, ondansetron (ZOFRAN) IV, sodium chloride flush   Objective:   Vitals:   06/29/20 0305 06/29/20 0404 06/29/20 0612  06/29/20 0809  BP: 94/70  (!) 82/61 103/78  Smith: 82  77 60  Resp: 16   19  Temp: 98 F (36.7 C)   97.9 F (36.6 C)  TempSrc: Oral   Oral  SpO2: 91%   94%  Weight:  76.9 kg    Height:        Intake/Output Summary (Last 24 hours) at 06/29/2020 1141 Last data filed at 06/29/2020 0300 Gross per 24 hour  Intake 0 ml  Output 350 ml  Net -350 ml   Filed Weights   06/28/20 1455 06/29/20 0052 06/29/20 0404  Weight: 77.5 kg 76.9 kg 76.9 kg     Examination:      Physical Exam:   General:  Alert, oriented, cooperative, no distress;   HEENT:  Normocephalic, PERRL, otherwise with in Normal limits   Neuro:  CNII-XII intact. , normal motor and sensation, reflexes intact   Lungs:  Bilateral rhonchi, minimal crackles at lower lobes, breathing nonlabored,   Cardio:    S1/S2, RRR, No murmure, No Rubs or Gallops   Abdomen:   Soft, non-tender, bowel sounds active all four quadrants,  no guarding or peritoneal signs.  Muscular skeletal:  Limited exam - in bed, able to move all 4 extremities, Normal strength,  2+ pulses,  symmetric, No pitting edema  Skin:  Dry, warm to touch, negative for any Rashes, No open wounds  Wounds: Please see nursing documentation            ------------------------------------------------------------------------------------------------------------------------------------------    LABs:  CBC Latest Ref Rng & Units 06/28/2020 06/18/2019 12/04/2018  WBC 4.0 - 10.5 K/uL 10.0 5.2 6.6  Hemoglobin 13.0 - 17.0 g/dL 56.3 12.2(L) 12.8(L)  Hematocrit 39 - 52 % 42.2 37.7(L) 38.7(L)  Platelets 150 - 400 K/uL 194 176 163   CMP Latest Ref Rng & Units 06/29/2020 06/28/2020 06/18/2019  Glucose 70 - 99 mg/dL 96 893(T) 342(A)  BUN 8 - 23 mg/dL 19 15 20   Creatinine 0.61 - 1.24 mg/dL 7.68) 1.15(B  Sodium 135 - 145 mmol/L 137 138 136  Potassium 3.5 - 5.1 mmol/L 3.9 4.3 3.9  Chloride 98 - 111 mmol/L 102 105 107  CO2 22 - 32 mmol/L 27 24 23   Calcium 8.9 - 10.3 mg/dL 8.9  9.0 2.62)  Total Protein 6.5 - 8.1 g/dL - 6.8 5.9(L)  Total Bilirubin 0.3 - 1.2 mg/dL - 0.9 0.5  Alkaline Phos 38 - 126 U/L - 67 54  AST 15 - 41 U/L - 20 20  ALT 0 - 44 U/L - 17 14       Micro Results Recent Results (from the past 240 hour(s))  SARS Coronavirus 2 by RT PCR (hospital order, performed in [facility] hospital lab) Nasopharyngeal Nasopharyngeal Swab     Status: None   Collection Time: 06/28/20  1:31 AM   Specimen: Nasopharyngeal Swab  Result Value Ref Range Status   SARS Coronavirus 2 NEGATIVE NEGATIVE Final    Comment: (NOTE) SARS-CoV-2 target nucleic acids are NOT DETECTED.  The SARS-CoV-2 RNA is generally detectable in upper and lower respiratory specimens during the acute phase of infection. The lowest concentration of SARS-CoV-2 viral copies this assay can detect is 250 copies / mL. A negative result does not preclude SARS-CoV-2 infection and should not be used as the sole basis for treatment or other patient management decisions.  A negative result may occur with improper specimen collection / handling, submission of specimen other than nasopharyngeal swab, presence of viral mutation(s) within the areas targeted by this assay, and inadequate number of viral copies (<250 copies / mL). A negative result must be combined with clinical observations, patient history, and epidemiological information.  Fact Sheet for Patients:   [facility]  Fact Sheet for Healthcare Providers: 06/30/20  This test is not yet approved or  cleared by the [employer] FDA and has been authorized for detection and/or diagnosis of SARS-CoV-2 by FDA under an Emergency Use Authorization (EUA).  This EUA will remain in effect (meaning this test can be used) for the duration of the COVID-19 declaration under Section 564(b)(1) of the Act, 21 U.S.C. section 360bbb-3(b)(1), unless the authorization is terminated or revoked  sooner.  Performed at [facility], [address] [place], [address] [place]   CULTURE, BLOOD (ROUTINE X 2) w Reflex to ID Panel     Status: None (Preliminary result)   Collection Time: 06/28/20 11:22 AM  Specimen: BLOOD RIGHT ARM  Result Value Ref Range Status   Specimen Description BLOOD RIGHT ARM  Final   Special Requests   Final    BOTTLES DRAWN AEROBIC AND ANAEROBIC Blood Culture adequate volume   Culture   Final    NO GROWTH < 24 HOURS Performed at [facility], [address] [place], [place] [address]    Report Status PENDING  Incomplete  CULTURE, BLOOD (ROUTINE X 2) w Reflex to ID Panel     Status: None (Preliminary result)   Collection Time: 06/28/20 11:23 AM   Specimen: BLOOD LEFT ARM  Result Value Ref Range Status   Specimen Description BLOOD LEFT ARM  Final   Special Requests   Final    BOTTLES DRAWN AEROBIC AND ANAEROBIC Blood Culture adequate volume   Culture   Final    NO GROWTH < 24 HOURS Performed at [facility], [address], [place], [place] [address]    Report Status PENDING  Incomplete    Radiology Reports DG Chest 1 View  Result Date: 06/28/2020 CLINICAL DATA:  Shortness of breath for 2 weeks EXAM: CHEST  1 VIEW COMPARISON:  06/18/2019 FINDINGS: Cardiac shadow is stable. Aortic calcifications are noted. Diffuse bilateral infiltrates are identified. No sizable effusion is seen. No bony abnormality is noted. IMPRESSION: New diffuse bilateral infiltrates consistent with multifocal pneumonia. Electronically Signed   By: Alcide Clever M.D.   On: 06/28/2020 01:57   NM Pulmonary Perfusion  Result Date: 06/28/2020 CLINICAL DATA:  Positive D-dimer, shortness of breath EXAM: NUCLEAR MEDICINE PERFUSION LUNG SCAN TECHNIQUE: Perfusion images were obtained in multiple projections after intravenous injection of radiopharmaceutical. Ventilation scans intentionally deferred if perfusion scan and chest x-ray adequate for interpretation  during COVID 19 epidemic. RADIOPHARMACEUTICALS:  3.82 mCi Tc-62m MAA IV COMPARISON:  06/28/2020 FINDINGS: Planar images of the chest are obtained during the perfusion phase of the exam in multiple projections. There is a small peripheral perfusion defect within the right upper lobe. This is smaller than the corresponding opacity on chest x-ray. No other wedge-shaped perfusion defects are observed. IMPRESSION: 1. No evidence of pulmonary embolus based on PISAPED criteria. Electronically Signed   By: Sharlet Salina M.D.   On: 06/28/2020 15:11   ECHOCARDIOGRAM COMPLETE BUBBLE STUDY  Result Date: 06/28/2020    ECHOCARDIOGRAM REPORT   Patient Name:   Jeffery Smith Date of Exam: 06/28/2020 Medical Rec #:  [id number]            Height:       73.0 in Accession #:    [id number]           Weight:       175.0 lb Date of Birth:  January 18, 1925            BSA:          2.033 m Patient Age:    86 years             BP:           99/82 mmHg Patient Gender: M                    HR:           44 bpm. Exam Location:  [facility] Procedure: 2D Echo, Cardiac Doppler, Color Doppler and Saline Contrast Bubble            Study Indications:     Shortness of breath, Elevated brain natriuretic peptide  History:  Patient has prior history of Echocardiogram examinations, most                  recent 07/16/2019. Hypertrophic Cardiomyopathy; Risk                  Factors:Dyslipidemia. Dysrhythmia.  Sonographer:     Cristela BlueJerry Hege RDCS (AE) Referring Phys:  [id number] Zerita BoersZACHARY P SMITH Diagnosing Phys: Adrian BlackwaterShaukat Khan MD  Sonographer Comments: Technically challenging study due to limited acoustic windows and no parasternal window. IMPRESSIONS  1. Left ventricular ejection fraction, by estimation, is 35 to 40%. The left ventricle has moderately decreased function. The left ventricle demonstrates global hypokinesis. There is moderate concentric left ventricular hypertrophy. Left ventricular diastolic parameters are consistent with Grade I diastolic  dysfunction (impaired relaxation).  2. Right ventricular systolic function is low normal. The right ventricular size is mildly enlarged.  3. Left atrial size was moderately dilated.  4. Right atrial size was moderately dilated.  5. The mitral valve is degenerative. Mild mitral valve regurgitation. No evidence of mitral stenosis. Moderate to severe mitral annular calcification.  6. The aortic valve is calcified. Aortic valve regurgitation is mild to moderate. Moderate to severe aortic valve stenosis.  7. The inferior vena cava is normal in size with greater than 50% respiratory variability, suggesting right atrial pressure of 3 mmHg.  8. Agitated saline contrast bubble study was negative, with no evidence of any interatrial shunt. FINDINGS  Left Ventricle: Left ventricular ejection fraction, by estimation, is 35 to 40%. The left ventricle has moderately decreased function. The left ventricle demonstrates global hypokinesis. The left ventricular internal cavity size was normal in size. There is moderate concentric left ventricular hypertrophy. Left ventricular diastolic parameters are consistent with Grade I diastolic dysfunction (impaired relaxation). Right Ventricle: The right ventricular size is mildly enlarged. No increase in right ventricular wall thickness. Right ventricular systolic function is low normal. Left Atrium: Left atrial size was moderately dilated. Right Atrium: Right atrial size was moderately dilated. Pericardium: There is no evidence of pericardial effusion. Mitral Valve: The mitral valve is degenerative in appearance. Moderate to severe mitral annular calcification. Mild mitral valve regurgitation. No evidence of mitral valve stenosis. Tricuspid Valve: The tricuspid valve is normal in structure. Tricuspid valve regurgitation is mild . No evidence of tricuspid stenosis. Aortic Valve: The aortic valve is calcified. Aortic valve regurgitation is mild to moderate. Moderate to severe aortic stenosis  is present. Aortic valve mean gradient measures 24.0 mmHg. Aortic valve peak gradient measures 44.2 mmHg. Aortic valve area, by VTI measures 0.52 cm. Pulmonic Valve: The pulmonic valve was normal in structure. Pulmonic valve regurgitation is mild. No evidence of pulmonic stenosis. Aorta: The aortic root is normal in size and structure. Venous: The inferior vena cava is normal in size with greater than 50% respiratory variability, suggesting right atrial pressure of 3 mmHg. IAS/Shunts: No atrial level shunt detected by color flow Doppler. Agitated saline contrast was given intravenously to evaluate for intracardiac shunting. Agitated saline contrast bubble study was negative, with no evidence of any interatrial shunt.  LEFT VENTRICLE PLAX 2D LVIDd:         4.64 cm      Diastology LVIDs:         3.63 cm      LV e' medial:    4.90 cm/s LV PW:         1.35 cm      LV E/e' medial:  14.0 LV IVS:  1.28 cm      LV e' lateral:   4.68 cm/s LVOT diam:     2.00 cm      LV E/e' lateral: 14.7 LV SV:         32 LV SV Index:   16 LVOT Area:     3.14 cm  LV Volumes (MOD) LV vol d, MOD A2C: 105.0 ml LV vol d, MOD A4C: 140.0 ml LV vol s, MOD A2C: 75.0 ml LV vol s, MOD A4C: 85.6 ml LV SV MOD A2C:     30.0 ml LV SV MOD A4C:     140.0 ml LV SV MOD BP:      42.0 ml RIGHT VENTRICLE RV Basal diam:  4.55 cm RV S prime:     12.40 cm/s TAPSE (M-mode): 4.0 cm LEFT ATRIUM             Index       RIGHT ATRIUM           Index LA diam:        3.10 cm 1.52 cm/m  RA Area:     21.70 cm LA Vol (A2C):   47.0 ml 23.12 ml/m RA Volume:   68.60 ml  33.74 ml/m LA Vol (A4C):   32.5 ml 15.99 ml/m LA Biplane Vol: 39.4 ml 19.38 ml/m  AORTIC VALVE AV Area (Vmax):    0.69 cm AV Area (Vmean):   0.56 cm AV Area (VTI):     0.52 cm AV Vmax:           332.25 cm/s AV Vmean:          221.250 cm/s AV VTI:            0.623 m AV Peak Grad:      44.2 mmHg AV Mean Grad:      24.0 mmHg LVOT Vmax:         73.10 cm/s LVOT Vmean:        39.300 cm/s LVOT VTI:           0.103 m LVOT/AV VTI ratio: 0.17  AORTA Ao Root diam: 3.10 cm MITRAL VALVE               TRICUSPID VALVE MV Area (PHT): 4.44 cm    TR Peak grad:   33.4 mmHg MV Decel Time: 171 msec    TR Vmax:        289.00 cm/s MV E velocity: 68.60 cm/s MV A velocity: 96.00 cm/s  SHUNTS MV E/A ratio:  0.71        Systemic VTI:  0.10 m                            Systemic Diam: 2.00 cm Adrian Blackwater MD Electronically signed by Adrian Blackwater MD Signature Date/Time: 06/28/2020/5:03:55 PM    Final     SIGNED: Kendell Bane, MD, FACP, FHM. [facility],  Pager (please use [email or web address] to page/text)  If 7PM-7AM, please contact night-coverage [email or web address] 06/29/2020, 11:41 AM

## 2020-06-30 DIAGNOSIS — I350 Nonrheumatic aortic (valve) stenosis: Secondary | ICD-10-CM

## 2020-06-30 DIAGNOSIS — I5043 Acute on chronic combined systolic (congestive) and diastolic (congestive) heart failure: Principal | ICD-10-CM

## 2020-06-30 DIAGNOSIS — Z9889 Other specified postprocedural states: Secondary | ICD-10-CM

## 2020-06-30 DIAGNOSIS — R778 Other specified abnormalities of plasma proteins: Secondary | ICD-10-CM

## 2020-06-30 DIAGNOSIS — N1831 Chronic kidney disease, stage 3a: Secondary | ICD-10-CM

## 2020-06-30 DIAGNOSIS — J9601 Acute respiratory failure with hypoxia: Secondary | ICD-10-CM

## 2020-06-30 LAB — BASIC METABOLIC PANEL
Anion gap: 11 (ref 5–15)
BUN: 22 mg/dL (ref 8–23)
CO2: 26 mmol/L (ref 22–32)
Calcium: 8.9 mg/dL (ref 8.9–10.3)
Chloride: 101 mmol/L (ref 98–111)
Creatinine, Ser: 1.2 mg/dL (ref 0.61–1.24)
GFR calc Af Amer: >60 mL/min (ref >60)
GFR calc non Af Amer: 52 mL/min — ABNORMAL LOW (ref >60)
Glucose, Bld: 95 mg/dL (ref 70–99)
Potassium: 3.9 mmol/L (ref 3.5–5.1)
Sodium: 138 mmol/L (ref 135–145)

## 2020-06-30 LAB — GLUCOSE, CAPILLARY: Glucose-Capillary: 94 mg/dL (ref 70–99)

## 2020-06-30 LAB — BRAIN NATRIURETIC PEPTIDE: B Natriuretic Peptide: 932.2 pg/mL — ABNORMAL HIGH (ref 0.0–100.0)

## 2020-06-30 MED ORDER — ASPIRIN EC 81 MG PO TBEC
81.0000 mg | DELAYED_RELEASE_TABLET | Freq: Every day | ORAL | Status: DC
  Administered 2020-06-30 – 2020-07-01 (×2): 81 mg via ORAL
  Filled 2020-06-30 (×2): qty 1

## 2020-06-30 MED ORDER — FUROSEMIDE 10 MG/ML IJ SOLN
20.0000 mg | Freq: Two times a day (BID) | INTRAMUSCULAR | Status: DC
  Administered 2020-06-30 (×2): 20 mg via INTRAVENOUS
  Filled 2020-06-30: qty 2

## 2020-06-30 MED ORDER — PANTOPRAZOLE SODIUM 40 MG PO TBEC
40.0000 mg | DELAYED_RELEASE_TABLET | Freq: Every day | ORAL | Status: DC
  Administered 2020-06-30 – 2020-07-01 (×2): 40 mg via ORAL
  Filled 2020-06-30 (×2): qty 1

## 2020-06-30 NOTE — Evaluation (Signed)
Physical Therapy Evaluation Patient Details Name: Jeffery Smith MRN: [id number] DOB: [birth year] Today's Date: 06/30/2020   History of Present Illness  Jeffery Smith 86 year old male with history of diastolic heart failure, last EF 60-65 in 07/2019, carotid artery stenosis status post endarterectomy, moderate to severe aortic stenosis, CKD 3a, presenting with shortness of breath. Likely due to pneumonia, CHF exacerbation.    Clinical Impression  Pt alert, oriented and sitting EOB with OT at start of session. PLOF gathered from OT evaluation but at baseline pt was not using an AD for ambulation.  The patient demonstrated good LE strength, and was assisted with minA and RW to stand pivot to recliner in room. Further mobility deferred due to pt orthostatic vitals with OT. The patient remained highly motivated and wants to go to home. Educated on LE exercises to maintain strength and increase blood flow. Overall the patient demonstrated mild deficits in activity tolerance and mobility compared to baseline PLOF and would benefit from further skilled PT intervention. Recommendation is HHPT with supervision for mobility/OOB pending progress.    Follow Up Recommendations Home health PT;Supervision for mobility/OOB    Equipment Recommendations  None recommended by PT    Recommendations for Other Services       Precautions / Restrictions Precautions Precautions: Fall Precaution Comments: watch BP Restrictions Weight Bearing Restrictions: No      Mobility  Bed Mobility Overal bed mobility: Needs Assistance Bed Mobility: Supine to Sit     Supine to sit: Min guard     General bed mobility comments: per OT CGA for bed mobility  Transfers Overall transfer level: Needs assistance Equipment used: Rolling walker (2 wheeled) Transfers: Sit to/from [employer] Sit to Stand: Min assist Stand pivot transfers: Min assist          Ambulation/Gait              General Gait Details: deferred due to low BP  Stairs            Wheelchair Mobility    Modified Rankin (Stroke Patients Only)       Balance Overall balance assessment: Needs assistance Sitting-balance support: Feet supported Sitting balance-Leahy Scale: Good Sitting balance - Comments: no LOB   Standing balance support: During functional activity Standing balance-Leahy Scale: Fair                               Pertinent Vitals/Pain Pain Assessment: No/denies pain    Home Living Family/patient expects to be discharged to:: Private residence Living Arrangements: Alone Available Help at Discharge: Family;Available 24 hours/day;Other (Comment) (Wife will be staying with him at discharge.) Type of Home: House Home Access: Stairs to enter Entrance Stairs-Rails: Right;Left Entrance Stairs-Number of Steps: 2 STE Home Layout: One level Home Equipment: Walker - 2 wheels;Cane - quad;Cane - single point;Shower seat Additional Comments: PLOF gathered from OT    Prior Function Level of Independence: Independent with assistive device(s)         Comments: uses can for mobility when needed and mod I for self care tasks. 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     Hand Dominance   Dominant Hand: Right    Extremity/Trunk Assessment   Upper Extremity Assessment Upper Extremity Assessment: Overall WFL for tasks assessed    Lower Extremity Assessment Lower Extremity Assessment:  (grossly 4+/5)    Cervical / Trunk Assessment Cervical / Trunk Assessment: Normal  Communication  Communication: HOH  Cognition Arousal/Alertness: Awake/alert Behavior During Therapy: WFL for tasks assessed/performed Overall Cognitive Status: Within Functional Limits for tasks assessed                                        General Comments      Exercises     Assessment/Plan    PT Assessment Patient needs continued PT services  PT Problem List  Decreased strength;Decreased activity tolerance;Decreased balance;Decreased mobility       PT Treatment Interventions DME instruction;Balance training;Gait training;Neuromuscular re-education;Stair training;Functional mobility training;Patient/family education;Therapeutic activities;Therapeutic exercise    PT Goals (Current goals can be found in the Care Plan section)  Acute Rehab PT Goals Patient Stated Goal: to go home PT Goal Formulation: With patient Time For Goal Achievement: 07/14/20 Potential to Achieve Goals: Good    Frequency Min 2X/week   Barriers to discharge        Co-evaluation               AM-PAC PT "6 Clicks" Mobility  Outcome Measure Help needed turning from your back to your side while in a flat bed without using bedrails?: None Help needed moving from lying on your back to sitting on the side of a flat bed without using bedrails?: None Help needed moving to and from a bed to a chair (including a wheelchair)?: None Help needed standing up from a chair using your arms (e.g., wheelchair or bedside chair)?: A Little Help needed to walk in hospital room?: A Little Help needed climbing 3-5 steps with a railing? : A Little 6 Click Score: 21    End of Session Equipment Utilized During Treatment: Gait belt Activity Tolerance: Patient tolerated treatment well Patient left: in chair;with call bell/phone within reach;with chair alarm set;with family/visitor present Nurse Communication: Mobility status PT Visit Diagnosis: Other abnormalities of gait and mobility (R26.89);Difficulty in walking, not elsewhere classified (R26.2);Muscle weakness (generalized) (M62.81)    Time: 4332-9518 PT Time Calculation (min) (ACUTE ONLY): 9 min   Charges:   PT Evaluation $PT Eval Low Complexity: 1 Low         Olga Coaster PT, DPT 12:52 PM,06/30/20

## 2020-06-30 NOTE — Progress Notes (Signed)
SUBJECTIVE: Patient resting comfortably. Currently on RA but required 2-4L oxygen intermittently yesterday. Denies shortness of breath or chest pain at this time.   Vitals:   06/29/20 2359 06/30/20 0418 06/30/20 0500 06/30/20 0758  BP: (!) 103/58 109/86  (!) 104/54  Pulse: 72 (!) 46 70 74  Resp: 20 18  18   Temp: 98.1 F (36.7 C) 97.7 F (36.5 C)  98.4 F (36.9 C)  TempSrc: Oral Oral    SpO2: 92% 94%  96%  Weight:  74.6 kg    Height:        Intake/Output Summary (Last 24 hours) at 06/30/2020 1047 Last data filed at 06/30/2020 0943 Gross per 24 hour  Intake 486 ml  Output 250 ml  Net 236 ml    LABS: Basic Metabolic Panel: Recent Labs    06/28/20 0131 06/28/20 0131 06/29/20 0426 06/30/20 0446  NA 138   < > 137 138  K 4.3   < > 3.9 3.9  CL 105   < > 102 101  CO2 24   < > 27 26  GLUCOSE 136*   < > 96 95  BUN 15   < > 19 22  CREATININE 1.27*   < > 1.24 1.20  CALCIUM 9.0   < > 8.9 8.9  MG 2.1  --   --   --    < > = values in this interval not displayed.   Liver Function Tests: Recent Labs    06/28/20 0131 06/29/20 0426  AST 20  --   ALT 17  --   ALKPHOS 67  --   BILITOT 0.9  --   PROT 6.8  --   ALBUMIN 3.9 3.3*   No results for input(s): LIPASE, AMYLASE in the last 72 hours. CBC: Recent Labs    06/28/20 0131  WBC 10.0  NEUTROABS 7.3  HGB 13.9  HCT 42.2  MCV 98.6  PLT 194   Cardiac Enzymes: No results for input(s): CKTOTAL, CKMB, CKMBINDEX, TROPONINI in the last 72 hours. BNP: Invalid input(s): POCBNP D-Dimer: No results for input(s): DDIMER in the last 72 hours. Hemoglobin A1C: No results for input(s): HGBA1C in the last 72 hours. Fasting Lipid Panel: No results for input(s): CHOL, HDL, LDLCALC, TRIG, CHOLHDL, LDLDIRECT in the last 72 hours. Thyroid Function Tests: No results for input(s): TSH, T4TOTAL, T3FREE, THYROIDAB in the last 72 hours.  Invalid input(s): FREET3 Anemia Panel: No results for input(s): VITAMINB12, FOLATE, FERRITIN, TIBC,  IRON, RETICCTPCT in the last 72 hours.   PHYSICAL EXAM General: Well developed, well nourished, in no acute distress HEENT:  Normocephalic and atramatic Neck:  No JVD.  Lungs: Clear bilaterally to auscultation and percussion. Heart: HRRR . Normal S1 and S2 without gallops or murmurs.  Abdomen: Bowel sounds are positive, abdomen soft and non-tender  Msk:  Back normal, normal gait. Normal strength and tone for age. Extremities: No clubbing, cyanosis or edema.   Neuro: Alert and oriented X 3. Psych:  Good affect, responds appropriately  TELEMETRY: NSR 70s  ASSESSMENT AND PLAN: Patient presenting to the emergency department with respiratory distress.  Combined HFrEF / HFpEF. HR improved with carvedilol. Patient remains mildly hypotensive. Continue losartan as tolerated. Lungs CTA, but patient still requiring intermittent oxygen. Continue IV diuresis as tolerated. With a history of CKD 3 and hypotension, will hold on starting spironolactone at this time. We will continue to follow.  Principal Problem:   Acute on chronic diastolic CHF (congestive heart failure) (HCC) Active Problems:  Acute respiratory failure with hypoxia (HCC)   Moderate to severe aortic stenosis   H/O carotid endarterectomy   Elevated troponin   Chronic kidney disease, stage 3a    Jeffery Kaufmann, NP-C 06/30/2020 10:47 AM

## 2020-06-30 NOTE — Evaluation (Signed)
Occupational Therapy Evaluation Patient Details Name: Jeffery Smith MRN: [id number] DOB: 03-13-1925 Today's Date: 06/30/2020    History of Present Illness Jeffery Smith 86 year old male with history of diastolic heart failure, last EF 60-65 in 07/2019, carotid artery stenosis status post endarterectomy, moderate to severe aortic stenosis, CKD 3a, presenting with shortness of breath. Likely due to pneumonia, CHF exacerbation.. Acute respiratory failure hypoxemia and on 1 L O2 via [place].   Clinical Impression   Patient presenting with decreased I in self care, balance, functional transfer/mobility, endurance, and safety awareness, and strength. Patient reports being very active and mod I PTA with use of cane as needed. Pt's wife lives in separate home but plans on coming to stay with him and provide 24/7 assist/S at discharge.  Patient currently functioning min A overall. Patient will benefit from acute OT to increase overall independence in the areas of ADLs, functional mobility, and safety awareness in order to safely discharge home with caregiver.  Orthostatic BP: Supine: 82/50 EOB: 81/67 Standing: 70/54 Standing for 3 minutes: 82/67    Follow Up Recommendations  Home health OT;Supervision/Assistance - 24 hour    Equipment Recommendations  None recommended by OT    Recommendations for Other Services Other (comment) (none at this time)     Precautions / Restrictions Precautions Precautions: Fall Precaution Comments: watch BP      Mobility Bed Mobility Overal bed mobility: Needs Assistance Bed Mobility: Supine to Sit     Supine to sit: Min guard     General bed mobility comments: min cuing for hand placement and technique  Transfers Overall transfer level: Needs assistance Equipment used: Rolling walker (2 wheeled) Transfers: Sit to/from [employer] Sit to Stand: Min assist Stand pivot transfers: Min assist            Balance Overall  balance assessment: Needs assistance Sitting-balance support: Feet supported Sitting balance-Leahy Scale: Good Sitting balance - Comments: no LOB   Standing balance support: During functional activity Standing balance-Leahy Scale: Fair          ADL either performed or assessed with clinical judgement   ADL Overall ADL's : Needs assistance/impaired     Grooming: Wash/dry hands;Wash/dry face;Oral care;Sitting;Set up   Upper Body Bathing: Set up;Sitting   Lower Body Bathing: Minimal assistance;Sit to/from stand   Upper Body Dressing : Set up;Sitting   Lower Body Dressing: Minimal assistance;Sit to/from stand   Toilet Transfer: Minimal assistance;RW   Toileting- Clothing Manipulation and Hygiene: Minimal assistance;Sit to/from stand       Functional mobility during ADLs: Minimal assistance;Min guard;Rolling walker General ADL Comments: Min A for functional transfers and LB self care. Pt hypotensive and asymptomatic during this session. Set up A for UB self care and grooming while seated.     Vision Baseline Vision/History: Wears glasses Wears Glasses: At all times Patient Visual Report: No change from baseline              Pertinent Vitals/Pain Pain Assessment: No/denies pain     Hand Dominance Right   Extremity/Trunk Assessment Upper Extremity Assessment Upper Extremity Assessment: Overall WFL for tasks assessed   Lower Extremity Assessment Lower Extremity Assessment: Defer to PT evaluation       Communication Communication Communication: HOH   Cognition Arousal/Alertness: Awake/alert Behavior During Therapy: WFL for tasks assessed/performed Overall Cognitive Status: Within Functional Limits for tasks assessed                 Home Living Family/patient expects  to be discharged to:: Private residence Living Arrangements: Alone Available Help at Discharge: Family;Available 24 hours/day;Other (Comment) (Wife will be staying with him at  discharge.) Type of Home: House Home Access: Stairs to enter [employer] of Steps: 2 STE Entrance Stairs-Rails: Right;Left Home Layout: One level     Bathroom Shower/Tub: Producer, television/film/video: Standard Bathroom Accessibility: Yes How Accessible: Accessible via walker Home Equipment: Walker - 2 wheels;Cane - quad;Cane - single point;Shower seat          Prior Functioning/Environment Level of Independence: Independent with assistive device(s)        Comments: uses can for mobility when needed and mod I for self care tasks. 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        OT Problem List: Decreased strength;Decreased activity tolerance;Decreased safety awareness;Impaired balance (sitting and/or standing);Decreased knowledge of use of DME or AE      OT Treatment/Interventions: Self-care/ADL training;Therapeutic exercise;Therapeutic activities;Neuromuscular education;Energy conservation;Cognitive remediation/compensation;DME and/or AE instruction;Patient/family education;Balance training    OT Goals(Current goals can be found in the care plan section) Acute Rehab OT Goals Patient Stated Goal: to get better OT Goal Formulation: With patient Time For Goal Achievement: 07/14/20 Potential to Achieve Goals: Good ADL Goals Pt Will Perform Grooming: with modified independence;sitting Pt Will Perform Lower Body Dressing: with modified independence;sit to/from stand Pt Will Transfer to Toilet: with modified independence;ambulating Pt Will Perform Toileting - Clothing Manipulation and hygiene: with modified independence;sit to/from stand  OT Frequency: Min 1X/week   Barriers to D/C: Other (comment)  none at this time          AM-PAC OT "6 Clicks" Daily Activity     Outcome Measure Help from another person eating meals?: None Help from another person taking care of personal grooming?: None Help from another person toileting, which includes using toliet,  bedpan, or urinal?: A Little Help from another person bathing (including washing, rinsing, drying)?: A Little Help from another person to put on and taking off regular upper body clothing?: None Help from another person to put on and taking off regular lower body clothing?: A Little 6 Click Score: 21   End of Session Equipment Utilized During Treatment: Rolling walker;Oxygen (1 L O2 via [place]) Nurse Communication: Mobility status;Other (comment) (orthostatic vitals)  Activity Tolerance: Patient tolerated treatment well Patient left: in bed;with family/visitor present;Other (comment) (PT arriving for evaluation and pt on EOB)  OT Visit Diagnosis: Muscle weakness (generalized) (M62.81);History of falling (Z91.81)                Time: 6237-6283 OT Time Calculation (min): 26 min Charges:  OT General Charges $OT Visit: 1 Visit OT Evaluation $OT Eval Low Complexity: 1 Low OT Treatments $Self Care/Home Management : 8-22 mins  Jackquline Denmark, MS, OTR/L , CBIS ascom [phone]  06/30/20, 12:39 PM

## 2020-06-30 NOTE — Progress Notes (Signed)
Cross Cover Note Patient in with acute on chronic congestive heart failure treated with diuresis and BB therapy now with asymptomatic hypotension. STOPPED DIURETICS due to likely overdiuresed in short time.  Watching closely to eval need for fluid replacement.

## 2020-06-30 NOTE — Progress Notes (Addendum)
  Heart Failure Nurse Navigator Note  HfrEF 35-40%  By echocardiogram completed on this admission.  Previously reported at 60-65% along with moderate to severe aortic stenosis 07/2019.  Patient presented to the ED by way of EMS with a 2 week history of progressively worsening SOB, coughing and expectorating pink tinged sputum.  O2 saturations on room air 85%.   Comorbidities:  Moderate to severe aortic stenosis CKD stage 3 Osteoarthritis Coronary artery disease  Medications:  Lasix 20 mg IV BID Losartan 25 mg daily Coreg 3.125 mg BID  Labs:  Sodium 138, potassium 3.9, Bun 22, creatinine 1.2, BNP 932 ( 1,198 yesterday)  Orthostatic blood pressures performed yesterday --lying 96/60 pulse 76, sitting 86/53 pulse 43, standing 75/63 pulse 43.  Spoke with patients nurse Mardella Layman, plan to repeat today.  Weight-74.6 kg (yesterday 76.9)  Intake- 240 ml Output- 250 ml  Noted  4 beat run of NSVT at approximately 1520 on 06/29/2020.  Assessment:  General- he is awake and alert, denies any chest pain, pressure, or increasing SOB.  HEENT-pupils are equal,   Neck- no JVD  Cardiac- Heart tones regular, + murmur.  Respiratory- breath sounds clear to posterior auscultation.  GI-stomach soft, + bowel sounds  Skin intact.  No lower extremity edema noted.  Psych- is pleasant and appropriate.   Discussed with patient and wife, medications that he is on and why.  Discussed side effects and symptoms to report to doctor.  Reinforced low sodium diet and fluid restrictions along with daily weights and recording.   Tresa Endo RN, CHFN

## 2020-06-30 NOTE — Progress Notes (Signed)
PROGRESS NOTE    Jeffery Smith  ZOX:[id number] DOB: 29-Sep-1925 DOA: 06/28/2020 PCP: System, Provider Not In   Brief Narrative:  HPI per Dr. Lindajo Royal on 06/28/20 HPI: Jeffery Smith is a 86 y.o. male with medical history significant for diastolic heart failure, last EF 60-65 in 07/2019, carotid artery stenosis status post endarterectomy, moderate to severe aortic stenosis, , who presents to the emergency room with a 2-week history of progressively worsening shortness of breath that acutely worsened on the night of arrival.  He has lower extremity edema, orthopnea.  He denies chest pain.  He admits to congested but nonproductive cough that his wife attributes to allergies, but denied fever or chills.  Denies chest pain.  On arrival of EMS O2 sats was 85% on room air improving to the high 90s on nonrebreather.  ED Course: On arrival in the emergency room he was in acute respiratory distress with increased work of breathing.  He was tachycardic at 112 tachypneic at 26 with O2 sat 92% on O2 at 4 L.  BP 157/92.  Blood work significant for troponin 50, BNP 1424, D-dimer 617, procalcitonin less than 0.1.  WBC WNL.  Creatinine 1.27 which is about his baseline.  Chest x-ray showed new diffuse bilateral infiltrates consistent with multifocal pneumonia.  Covid negative EKG as interpreted by me: Sinus tachycardia with nonspecific ST-T wave changes Patient treated with IV Lasix.  Hospitalist consulted for admission.  **Interim History  Patient was found to have acute on chronic diastolic and systolic CHF and he is being currently diuresed and improving.  However he is slightly hypotensive and was orthostatic yesterday.  We will continue diuresis as tolerated and appreciate cardiology following.  Have ordered TED hose and abdominal binder for his orthostasis.  PT OT recommending home health whenever he is cleared from a cardiac perspective.  Assessment & Plan:   Principal Problem:   Acute on  chronic diastolic CHF (congestive heart failure) (HCC) Active Problems:   Acute respiratory failure with hypoxia (HCC)   Moderate to severe aortic stenosis   H/O carotid endarterectomy   Elevated troponin   Chronic kidney disease, stage 3a  Acute respiratory failure with hypoxia in the setting of acute on chronic systolic and diastolic CHF with ? Concomitant Multifocal PNA -On admission presented with respiratory failure hypoxia satting 85% on room air Initiated on nonrebreather mask, tapered to 4 L of oxygen -Respiratory failure postoperative be due to CHF exacerbation, multifocal pneumonia -SARS-CoV-2 negative -Continue to improve, patient has been weaned off supplemental oxygen 4 L now to room air, satting 99% yesterday and today is off of oxygen again -SpO2: 94 % O2 Flow Rate (L/min): 1 L/min -Still complaining shortness of breath with exertion -Yesterday morning complaining of dizziness blood pressure low -Continue pulmonary toiletry, as needed DuoNeb -Likely exacerbated by CHF exacerbation possible pneumonia -Elevated D-dimer (Fibrin Derivatives was 617.32), VQ scan negative for pulmonary embolism -2D cardiogram reviewed consistent with systolic and diastolicCHF, LVH, hypokinesis -Initiating empiric antibiotics ceftriaxone azithromycin will change to p.o. Ceftin near complete after 5 days given he is afebrile and off of oxygen now -Continuous pulse oximetry maintain O2 saturations greater than 90% -Continue supplemental oxygen via nasal cannula and wean O2 as tolerated -Repeat CXR in the AM   Hypotension -Check Orthostatic Vital Signs -He was orthostatic yesterday and was slightly orthostatic today -Diuresis as tolerated as well as we will start as tolerated -We will order TED hose and Abdominal Binder -Repeat orthostatic vital signs in  a.m. -Blood pressure was 83/66 this afternoon -Appreciate cardiology recommendations   Acute on Chronic combined new systolic and Grade  1 Diastolic CHF with an EF of 35-40% -2D echocardiogram reviewed, ejection fraction 35-40%, LVH, global hypokinesis, -Cardiology following and they started the patient on carvedilol and I changed his lisinopril to losartan; because of his history of CKD stage III there holding the starting spironolactone at this time and recommended IV diuretics yesterday transitioning to oral diuretics but he still remains on IV diuresis and cardiology recommends continue IV diuresis as tolerated as well as continue losartan as tolerated -Has been on Lasix-with the goal of diuresing -Lasix and lisinopril on hold today due to hypotension and lisinopril has been changed to losartan as above -Strict I's and O's and daily weights; patient is -1.664 L since admission and weight is down 11 pounds -BNP: 1424.7 -> 1198.1 -> 932.2 -Low-dose IV Lasix given the aortic stenosis and currently getting IV 20 mg q. 12 -Currently receiving carvedilol 3.125 mg p.o. twice daily, losartan 20 mg p.o. daily, as well as IV Lasix 20 mg every 12 -Cardiologist consulted, following appreciate recommendations  Elevated Troponin -Denies any chest pain -Troponin of 50 >> 75 -Patient had low risk nuclear stress test October 2020 -Cardiology following and C/w Carvedilol and Losartan -Home medications include ASA 81 mg and will resume   Moderate to Severe Aortic Stenosis.  Carotid stenosis and Hx of Right Sided Carotid Artery Occlusion -Avoid aggressive diuresis -C/w ASA -H/O carotid endarterectomy  Chronic kidney disease, stage 3a -Renal function at baseline and slightly improved -BUN/Cr went from 15/1.27 -> 19/1.24 -> 22/1.20 -Currently also on losartan and getting IV Lasix 20 mg every 12 hours -Avoid further nephrotoxic medications, contrast dyes, hypotension and renally dose medications -repeat CMP in a.m.  GERD -C/w Omeprazole Substitution with Pantoprazole 40 mg po Daily  DVT prophylaxis: Enoxaparin 40 mg subcu every 24  hours Code Status: FULL CODE Family Communication: Discussed with Wife at bedside  Disposition Plan: Anticipate D/C to Home with Home Health PT/OT when no longer orthostatic and cleared by Cardiology   Status is: Inpatient  Remains inpatient appropriate because:Hemodynamically unstable, IV treatments appropriate due to intensity of illness or inability to take PO and Inpatient level of care appropriate due to severity of illness   Dispo: The patient is from: Home              Anticipated d/c is to: Home              Anticipated d/c date is: 1 day              Patient currently is not medically stable to d/c.  Consultants:   Cardiology Dr. Welton Flakes   Procedures:  ECHOCARDIOGRAM IMPRESSIONS    1. Left ventricular ejection fraction, by estimation, is 35 to 40%. The  left ventricle has moderately decreased function. The left ventricle  demonstrates global hypokinesis. There is moderate concentric left  ventricular hypertrophy. Left ventricular  diastolic parameters are consistent with Grade I diastolic dysfunction  (impaired relaxation).  2. Right ventricular systolic function is low normal. The right  ventricular size is mildly enlarged.  3. Left atrial size was moderately dilated.  4. Right atrial size was moderately dilated.  5. The mitral valve is degenerative. Mild mitral valve regurgitation. No  evidence of mitral stenosis. Moderate to severe mitral annular  calcification.  6. The aortic valve is calcified. Aortic valve regurgitation is mild to  moderate. Moderate to severe aortic  valve stenosis.  7. The inferior vena cava is normal in size with greater than 50%  respiratory variability, suggesting right atrial pressure of 3 mmHg.  8. Agitated saline contrast bubble study was negative, with no evidence  of any interatrial shunt.   FINDINGS  Left Ventricle: Left ventricular ejection fraction, by estimation, is 35  to 40%. The left ventricle has moderately decreased  function. The left  ventricle demonstrates global hypokinesis. The left ventricular internal  cavity size was normal in size.  There is moderate concentric left ventricular hypertrophy. Left  ventricular diastolic parameters are consistent with Grade I diastolic  dysfunction (impaired relaxation).   Right Ventricle: The right ventricular size is mildly enlarged. No  increase in right ventricular wall thickness. Right ventricular systolic  function is low normal.   Left Atrium: Left atrial size was moderately dilated.   Right Atrium: Right atrial size was moderately dilated.   Pericardium: There is no evidence of pericardial effusion.   Mitral Valve: The mitral valve is degenerative in appearance. Moderate to  severe mitral annular calcification. Mild mitral valve regurgitation. No  evidence of mitral valve stenosis.   Tricuspid Valve: The tricuspid valve is normal in structure. Tricuspid  valve regurgitation is mild . No evidence of tricuspid stenosis.   Aortic Valve: The aortic valve is calcified. Aortic valve regurgitation is  mild to moderate. Moderate to severe aortic stenosis is present. Aortic  valve mean gradient measures 24.0 mmHg. Aortic valve peak gradient  measures 44.2 mmHg. Aortic valve area,  by VTI measures 0.52 cm.   Pulmonic Valve: The pulmonic valve was normal in structure. Pulmonic valve  regurgitation is mild. No evidence of pulmonic stenosis.   Aorta: The aortic root is normal in size and structure.   Venous: The inferior vena cava is normal in size with greater than 50%  respiratory variability, suggesting right atrial pressure of 3 mmHg.   IAS/Shunts: No atrial level shunt detected by color flow Doppler. Agitated  saline contrast was given intravenously to evaluate for intracardiac  shunting. Agitated saline contrast bubble study was negative, with no  evidence of any interatrial shunt.     LEFT VENTRICLE  PLAX 2D  LVIDd:     4.64 cm    Diastology  LVIDs:     3.63 cm   LV e' medial:  4.90 cm/s  LV PW:     1.35 cm   LV E/e' medial: 14.0  LV IVS:    1.28 cm   LV e' lateral:  4.68 cm/s  LVOT diam:   2.00 cm   LV E/e' lateral: 14.7  LV SV:     32  LV SV Index:  16  LVOT Area:   3.14 cm    LV Volumes (MOD)  LV vol d, MOD A2C: 105.0 ml  LV vol d, MOD A4C: 140.0 ml  LV vol s, MOD A2C: 75.0 ml  LV vol s, MOD A4C: 85.6 ml  LV SV MOD A2C:   30.0 ml  LV SV MOD A4C:   140.0 ml  LV SV MOD BP:   42.0 ml   RIGHT VENTRICLE  RV Basal diam: 4.55 cm  RV S prime:   12.40 cm/s  TAPSE (M-mode): 4.0 cm   LEFT ATRIUM       Index    RIGHT ATRIUM      Index  LA diam:    3.10 cm 1.52 cm/m RA Area:   21.70 cm  LA Vol (A2C):  47.0  ml 23.12 ml/m RA Volume:  68.60 ml 33.74 ml/m  LA Vol (A4C):  32.5 ml 15.99 ml/m  LA Biplane Vol: 39.4 ml 19.38 ml/m  AORTIC VALVE  AV Area (Vmax):  0.69 cm  AV Area (Vmean):  0.56 cm  AV Area (VTI):   0.52 cm  AV Vmax:      332.25 cm/s  AV Vmean:     221.250 cm/s  AV VTI:      0.623 m  AV Peak Grad:   44.2 mmHg  AV Mean Grad:   24.0 mmHg  LVOT Vmax:     73.10 cm/s  LVOT Vmean:    39.300 cm/s  LVOT VTI:     0.103 m  LVOT/AV VTI ratio: 0.17    AORTA  Ao Root diam: 3.10 cm   MITRAL VALVE        TRICUSPID VALVE  MV Area (PHT): 4.44 cm  TR Peak grad:  33.4 mmHg  MV Decel Time: 171 msec  TR Vmax:    289.00 cm/s  MV E velocity: 68.60 cm/s  MV A velocity: 96.00 cm/s SHUNTS  MV E/A ratio: 0.71    Systemic VTI: 0.10 m               Systemic Diam: 2.00 cm   Antimicrobials:  Anti-infectives (From admission, onward)   Start     Dose/Rate Route Frequency Ordered Stop   06/29/20 1000  azithromycin (ZITHROMAX) tablet 500 mg        500 mg Oral Daily 06/28/20 1513 07/03/20 0959   06/28/20 1600  cefTRIAXone (ROCEPHIN) 1 g in sodium chloride 0.9 % 100  mL IVPB        1 g 200 mL/hr over 30 Minutes Intravenous Every 24 hours 06/28/20 1513     06/28/20 0730  levofloxacin (LEVAQUIN) tablet 500 mg  Status:  Discontinued        500 mg Oral Daily 06/28/20 0724 06/28/20 1513        Subjective: Seen and examined at bedside and is still feeling a little short of breath and weak.  No nausea or vomiting.  Denies any lightheadedness dizziness.  No chest pain today.  No longer on supplemental oxygen via nasal cannula.  Feels okay.  No other concerns or complaints at this time.  Objective: Vitals:   06/30/20 0500 06/30/20 0758 06/30/20 1149 06/30/20 1448  BP:  (!) 104/54 (!) 80/44 (!) 83/66  Pulse: 70 74 97 78  Resp:  18 20   Temp:  98.4 F (36.9 C) 97.9 F (36.6 C) 98.5 F (36.9 C)  TempSrc:   Oral Oral  SpO2:  96% 96% 94%  Weight:      Height:        Intake/Output Summary (Last 24 hours) at 06/30/2020 1553 Last data filed at 06/30/2020 1500 Gross per 24 hour  Intake 246 ml  Output 450 ml  Net -204 ml   Filed Weights   06/29/20 0052 06/29/20 0404 06/30/20 0418  Weight: 76.9 kg 76.9 kg 74.6 kg   Examination: Physical Exam:  Constitutional: Thin elderly Caucasian male currently in NAD and appears calm and comfortable Eyes: Lids and conjunctivae normal, sclerae anicteric  ENMT: External Ears, Nose appear normal.  Somewhat hard of hearing Neck: Appears normal, supple, no cervical masses, normal ROM, no appreciable thyromegaly; no appreciable JVD Respiratory: Diminished to auscultation bilaterally with coarse breath sounds but no appreciable wheezing, rales, rhonchi or crackles. Normal respiratory effort and patient is not tachypenic. No  accessory muscle use.  Unlabored breathing today and not wearing supplemental oxygen via nasal cannula Cardiovascular: RRR, n has a 3 out of 6 systolic murmur. No extremity edema. Trace lower extremity edema noted  Abdomen: Soft, non-tender, non-distended. No masses palpated. No appreciable  hepatosplenomegaly. Bowel sounds positive.  GU: Deferred. Musculoskeletal: No clubbing / cyanosis of digits/nails. No joint deformity upper and lower extremities. Skin: No rashes, lesions, ulcers on limited skin evaluation. No induration; Warm and dry.  Neurologic: CN 2-12 grossly intact with no focal deficits. Romberg sign and cerebellar reflexes not assessed.  Psychiatric: Normal judgment and insight. Alert and oriented x 3. Normal mood and appropriate affect.   Data Reviewed: I have personally reviewed following labs and imaging studies  CBC: Recent Labs  Lab 06/28/20 0131  WBC 10.0  NEUTROABS 7.3  HGB 13.9  HCT 42.2  MCV 98.6  PLT 194   Basic Metabolic Panel: Recent Labs  Lab 06/28/20 0131 06/29/20 0426 06/30/20 0446  NA 138 137 138  K 4.3 3.9 3.9  CL 105 102 101  CO2 GLUCOSE 136* 96 95  BUN CREATININE 1.27* 1.24 1.20  CALCIUM 9.0 8.9 8.9  MG 2.1  --   --    GFR: Estimated Creatinine Clearance: 40.6 mL/min (by C-G formula based on SCr of 1.2 mg/dL). Liver Function Tests: Recent Labs  Lab 06/28/20 0131 06/29/20 0426  AST 20  --   ALT 17  --   ALKPHOS 67  --   BILITOT 0.9  --   PROT 6.8  --   ALBUMIN 3.9 3.3*   No results for input(s): LIPASE, AMYLASE in the last 168 hours. No results for input(s): AMMONIA in the last 168 hours. Coagulation Profile: No results for input(s): INR, PROTIME in the last 168 hours. Cardiac Enzymes: No results for input(s): CKTOTAL, CKMB, CKMBINDEX, TROPONINI in the last 168 hours. BNP (last 3 results) No results for input(s): PROBNP in the last 8760 hours. HbA1C: No results for input(s): HGBA1C in the last 72 hours. CBG: No results for input(s): GLUCAP in the last 168 hours. Lipid Profile: No results for input(s): CHOL, HDL, LDLCALC, TRIG, CHOLHDL, LDLDIRECT in the last 72 hours. Thyroid Function Tests: No results for input(s): TSH, T4TOTAL, FREET4, T3FREE, THYROIDAB in the last 72 hours. Anemia  Panel: No results for input(s): VITAMINB12, FOLATE, FERRITIN, TIBC, IRON, RETICCTPCT in the last 72 hours. Sepsis Labs: Recent Labs  Lab 06/28/20 0131  PROCALCITON <0.10    Recent Results (from the past 240 hour(s))  SARS Coronavirus 2 by RT PCR (hospital order, performed in [facility] hospital lab) Nasopharyngeal Nasopharyngeal Swab     Status: None   Collection Time: 06/28/20  1:31 AM   Specimen: Nasopharyngeal Swab  Result Value Ref Range Status   SARS Coronavirus 2 NEGATIVE NEGATIVE Final    Comment: (NOTE) SARS-CoV-2 target nucleic acids are NOT DETECTED.  The SARS-CoV-2 RNA is generally detectable in upper and lower respiratory specimens during the acute phase of infection. The lowest concentration of SARS-CoV-2 viral copies this assay can detect is 250 copies / mL. A negative result does not preclude SARS-CoV-2 infection and should not be used as the sole basis for treatment or other patient management decisions.  A negative result may occur with improper specimen collection / handling, submission of specimen other than nasopharyngeal swab, presence of viral mutation(s) within the areas targeted by this assay, and inadequate number of viral copies (<250 copies / mL).  A negative result must be combined with clinical observations, patient history, and epidemiological information.  Fact Sheet for Patients:   BoilerBrush.com.cyhttps://www.fda.gov/media/136312/download  Fact Sheet for Healthcare Providers: https://pope.com/https://www.fda.gov/media/136313/download  This test is not yet approved or  cleared by the [place] FDA and has been authorized for detection and/or diagnosis of SARS-CoV-2 by FDA under an Emergency Use Authorization (EUA).  This EUA will remain in effect (meaning this test can be used) for the duration of the COVID-19 declaration under Section 564(b)(1) of the Act, 21 U.S.C. section 360bbb-3(b)(1), unless the authorization is terminated or revoked sooner.  Performed at [facility], [address] [place], [place] [address]   CULTURE, BLOOD (ROUTINE X 2) w Reflex to ID Panel     Status: None (Preliminary result)   Collection Time: 06/28/20 11:22 AM   Specimen: BLOOD RIGHT ARM  Result Value Ref Range Status   Specimen Description BLOOD RIGHT ARM  Final   Special Requests   Final    BOTTLES DRAWN AEROBIC AND ANAEROBIC Blood Culture adequate volume   Culture   Final    NO GROWTH 2 DAYS Performed at [facility], [address] [place], [place] [address]    Report Status PENDING  Incomplete  CULTURE, BLOOD (ROUTINE X 2) w Reflex to ID Panel     Status: None (Preliminary result)   Collection Time: 06/28/20 11:23 AM   Specimen: BLOOD LEFT ARM  Result Value Ref Range Status   Specimen Description BLOOD LEFT ARM  Final   Special Requests   Final    BOTTLES DRAWN AEROBIC AND ANAEROBIC Blood Culture adequate volume   Culture   Final    NO GROWTH 2 DAYS Performed at [facility], [address] [place], [place] [address]    Report Status PENDING  Incomplete     RN Pressure Injury Documentation:     Estimated body mass index is 21.69 kg/m as calculated from the following:   Height as of this encounter: 6\' 1"  (1.854 m).   Weight as of this encounter: 74.6 kg.  Malnutrition Type:      Malnutrition Characteristics:      Nutrition Interventions:     Radiology Studies: No results found.  Scheduled Meds: . azithromycin  500 mg Oral Daily  . carvedilol  3.125 mg Oral BID WC  . enoxaparin (LOVENOX) injection  40 mg Subcutaneous Q24H  . furosemide  20 mg Intravenous Q12H  . losartan  25 mg Oral Daily  . sodium chloride flush  3 mL Intravenous Q12H   Continuous Infusions: . sodium chloride    . cefTRIAXone (ROCEPHIN)  IV 1 g (06/29/20 1630)    LOS: 2 days   Merlene Laughtermair Latif Zedekiah Hinderman, DO [facility] PAGER is on AMION  If 7PM-7AM, please contact night-coverage [email or web address]

## 2020-07-01 ENCOUNTER — Inpatient Hospital Stay: Payer: No Typology Code available for payment source

## 2020-07-01 LAB — CBC WITH DIFFERENTIAL/PLATELET
Abs Immature Granulocytes: 0.03 10*3/uL (ref 0.00–0.07)
Basophils Absolute: 0 10*3/uL (ref 0.0–0.1)
Basophils Relative: 1 %
Eosinophils Absolute: 0.5 10*3/uL (ref 0.0–0.5)
Eosinophils Relative: 6 %
HCT: 39.2 % (ref 39.0–52.0)
Hemoglobin: 13.7 g/dL (ref 13.0–17.0)
Immature Granulocytes: 0 %
Lymphocytes Relative: 17 %
Lymphs Abs: 1.3 10*3/uL (ref 0.7–4.0)
MCH: 32.8 pg (ref 26.0–34.0)
MCHC: 34.9 g/dL (ref 30.0–36.0)
MCV: 93.8 fL (ref 80.0–100.0)
Monocytes Absolute: 0.9 10*3/uL (ref 0.1–1.0)
Monocytes Relative: 12 %
Neutro Abs: 5 10*3/uL (ref 1.7–7.7)
Neutrophils Relative %: 64 %
Platelets: 173 10*3/uL (ref 150–400)
RBC: 4.18 MIL/uL — ABNORMAL LOW (ref 4.22–5.81)
RDW: 13.7 % (ref 11.5–15.5)
WBC: 7.8 10*3/uL (ref 4.0–10.5)
nRBC: 0 % (ref 0.0–0.2)

## 2020-07-01 LAB — COMPREHENSIVE METABOLIC PANEL
ALT: 12 U/L (ref 0–44)
AST: 16 U/L (ref 15–41)
Albumin: 3.2 g/dL — ABNORMAL LOW (ref 3.5–5.0)
Alkaline Phosphatase: 54 U/L (ref 38–126)
Anion gap: 9 (ref 5–15)
BUN: 24 mg/dL — ABNORMAL HIGH (ref 8–23)
CO2: 26 mmol/L (ref 22–32)
Calcium: 9.2 mg/dL (ref 8.9–10.3)
Chloride: 99 mmol/L (ref 98–111)
Creatinine, Ser: 1.34 mg/dL — ABNORMAL HIGH (ref 0.61–1.24)
GFR calc Af Amer: 53 mL/min — ABNORMAL LOW (ref >60)
GFR calc non Af Amer: 45 mL/min — ABNORMAL LOW (ref >60)
Glucose, Bld: 104 mg/dL — ABNORMAL HIGH (ref 70–99)
Potassium: 4 mmol/L (ref 3.5–5.1)
Sodium: 134 mmol/L — ABNORMAL LOW (ref 135–145)
Total Bilirubin: 0.8 mg/dL (ref 0.3–1.2)
Total Protein: 6 g/dL — ABNORMAL LOW (ref 6.5–8.1)

## 2020-07-01 LAB — MAGNESIUM: Magnesium: 2.3 mg/dL (ref 1.7–2.4)

## 2020-07-01 LAB — GLUCOSE, CAPILLARY: Glucose-Capillary: 103 mg/dL — ABNORMAL HIGH (ref 70–99)

## 2020-07-01 LAB — BRAIN NATRIURETIC PEPTIDE: B Natriuretic Peptide: 624.6 pg/mL — ABNORMAL HIGH (ref 0.0–100.0)

## 2020-07-01 LAB — PHOSPHORUS: Phosphorus: 3.9 mg/dL (ref 2.5–4.6)

## 2020-07-01 MED ORDER — FUROSEMIDE 20 MG PO TABS: 20.0000 mg | ORAL_TABLET | ORAL | 2 refills | Status: DC

## 2020-07-01 MED ORDER — CARVEDILOL 3.125 MG PO TABS
3.1250 mg | ORAL_TABLET | Freq: Two times a day (BID) | ORAL | 0 refills | Status: DC
Start: 2020-07-01 — End: 2020-07-12

## 2020-07-01 MED ORDER — CEFDINIR 300 MG PO CAPS: 300.0000 mg | ORAL_CAPSULE | Freq: Two times a day (BID) | ORAL | 0 refills | Status: AC

## 2020-07-01 MED ORDER — AZITHROMYCIN 250 MG PO TABS: 500.0000 mg | ORAL_TABLET | Freq: Every day | ORAL | 0 refills | Status: AC

## 2020-07-01 MED ORDER — CEFDINIR 300 MG PO CAPS
300.0000 mg | ORAL_CAPSULE | Freq: Two times a day (BID) | ORAL | Status: DC
  Administered 2020-07-01: 300 mg via ORAL
  Filled 2020-07-01 (×2): qty 1

## 2020-07-01 MED ORDER — LOSARTAN POTASSIUM 25 MG PO TABS
12.5000 mg | ORAL_TABLET | Freq: Every day | ORAL | 0 refills | Status: DC
Start: 2020-07-02 — End: 2020-07-12

## 2020-07-01 NOTE — Progress Notes (Signed)
Physical Therapy Treatment Patient Details Name: Jeffery Smith MRN: [id number] DOB: 1925-07-14 Today's Date: 07/01/2020    History of Present Illness Jeffery Smith 86 year old male with history of diastolic heart failure, last EF 60-65 in 07/2019, carotid artery stenosis status post endarterectomy, moderate to severe aortic stenosis, CKD 3a, presenting with shortness of breath. Likely due to pneumonia, CHF exacerbation.    PT Comments    Pt received in supine position and agreeable to therapy.  Pt self-reports wanting to be able to ambulate around the nursing station so that he can go home.  Pt was able to perform bed-level exercises without much difficulty.  Pt then performed transfer from supine, to sitting, to standing with orthostatic measurements taken throughout process and noted below.  Pt did have response, however did not exhibit any symptoms warranting discontinuation of exercises/gait training.  Pt then proceeded to ambulate around nursing station with use of CGA for assistance.  Pt utilized FWW and has good safety awareness although HOH at times.  Pt then returned to sitting at recliner with chair alarm set and nursing notified of progress.  Pt BP taken in sitting after ambulating and was 121/86.  Current discharge plans to HHPT with supervision for mobility, remain appropriate at this time.  Pt will continue to benefit from skilled therapy in order to address deficits listed below.   Follow Up Recommendations  Home health PT;Supervision for mobility/OOB     Equipment Recommendations  None recommended by PT    Recommendations for Other Services       Precautions / Restrictions Precautions Precautions: Fall Precaution Comments: watch BP Restrictions Weight Bearing Restrictions: No    Mobility  Bed Mobility Overal bed mobility: Needs Assistance Bed Mobility: Supine to Sit     Supine to sit: Supervision     General bed mobility comments: supervision for  bed mobility  Transfers Overall transfer level: Needs assistance Equipment used: Rolling walker (2 wheeled) Transfers: Sit to/from [employer] Sit to Stand: Min guard Stand pivot transfers: Min guard       General transfer comment: Pt able to transfer to standing without difficulty.  No dizziness or lightheadedness reported.  Ambulation/Gait Ambulation/Gait assistance: Min guard Gait Distance (Feet): 180 Feet Assistive device: Rolling walker (2 wheeled) Gait Pattern/deviations: Step-through pattern;Decreased stride length Gait velocity: decreased   General Gait Details: monitored BP prior to attempting ambulation, and +2 assist with NT pushing recliner behind pt.   Stairs             Wheelchair Mobility    Modified Rankin (Stroke Patients Only)       Balance Overall balance assessment: Needs assistance Sitting-balance support: Feet supported Sitting balance-Leahy Scale: Good Sitting balance - Comments: no LOB   Standing balance support: Bilateral upper extremity supported Standing balance-Leahy Scale: Good                              Cognition Arousal/Alertness: Awake/alert Behavior During Therapy: WFL for tasks assessed/performed Overall Cognitive Status: Within Functional Limits for tasks assessed                                 General Comments: HOH      Exercises Total Joint Exercises Ankle Circles/Pumps: AROM;Strengthening;Both;10 reps;Supine Quad Sets: AROM;Strengthening;Both;10 reps;Supine Gluteal Sets: AROM;Strengthening;Both;10 reps;Supine Hip ABduction/ADduction: AROM;Strengthening;Both;10 reps;Supine Marching in Standing: AROM;Strengthening;Both;10 reps;Supine Other Exercises Other Exercises:  Pt educated on roles of PT and services provided during hospital stay. Other Exercises: Pt educated on importance of maintaining elevated BP during transfers to prevent syncopal episode. Other Exercises: Pt  ambulated 180 ft with FWW around nursing station and back to room with verbal cuing for hand placement and keeping FWW close to body for proper support.    General Comments        Pertinent Vitals/Pain Pain Assessment: No/denies pain    Home Living                      Prior Function            PT Goals (current goals can now be found in the care plan section) Progress towards PT goals: Progressing toward goals    Frequency    Min 2X/week      PT Plan      Co-evaluation              AM-PAC PT "6 Clicks" Mobility   Outcome Measure  Help needed turning from your back to your side while in a flat bed without using bedrails?: None Help needed moving from lying on your back to sitting on the side of a flat bed without using bedrails?: None Help needed moving to and from a bed to a chair (including a wheelchair)?: None Help needed standing up from a chair using your arms (e.g., wheelchair or bedside chair)?: A Little Help needed to walk in hospital room?: A Little Help needed climbing 3-5 steps with a railing? : A Little 6 Click Score: 21    End of Session Equipment Utilized During Treatment: Gait belt Activity Tolerance: Patient tolerated treatment well Patient left: in chair;with call bell/phone within reach;with chair alarm set;with family/visitor present Nurse Communication: Mobility status PT Visit Diagnosis: Other abnormalities of gait and mobility (R26.89);Difficulty in walking, not elsewhere classified (R26.2);Muscle weakness (generalized) (M62.81)     Time: 2774-1287 PT Time Calculation (min) (ACUTE ONLY): 27 min  Charges:  $Gait Training: 8-22 mins $Therapeutic Exercise: 8-22 mins                     Nolon Bussing, PT, DPT 07/01/20, 2:42 PM

## 2020-07-01 NOTE — Care Management Important Message (Signed)
Important Message  Patient Details  Name: Jeffery Smith MRN: [id number] Date of Birth: 1925-05-17   Medicare Important Message Given:  No  Patient discharged prior to arrival to unit to deliver concurrent Medicare IM.    [name] 07/01/2020, 2:02 PM

## 2020-07-01 NOTE — Discharge Summary (Signed)
Physician Discharge Summary  Jeffery Smith:[id number] DOB: [birth year] DOA: 06/28/2020  PCP: System, Provider Not In  Admit date: 06/28/2020 Discharge date: 07/01/2020  Admitted From: Home Disposition: Home with Home Health PT/OT  Recommendations for Outpatient Follow-up:  1. Follow up with PCP in 1-2 weeks 2. Follow up with Cardiology within 1 week; Will see Dr. Welton Flakes on 07/06/20 at 10:00 AM 3. Please obtain CMP/CBC, Mag, Phos in one week 4. Please follow up on the following pending results:  Home Health: Yes Equipment/Devices: None    Discharge Condition: Stable  CODE STATUS: FULL CODE  Diet recommendation: Heart Healthy Diet  Brief/Interim Summary: HPI per Dr. Lindajo Royal on 06/28/20 [id number] M Jeffery Smith a 86 y.o.malewith medical history significant fordiastolic heart failure, last EF 60-65 in 07/2019, carotid artery stenosis status post endarterectomy, moderate to severe aortic stenosis, , who presents to the emergency room with a 2-week history of progressively worsening shortness of breath that acutely worsened on the night of arrival. He has lower extremity edema, orthopnea. He denies chest pain. He admits to congested but nonproductive cough that his wife attributes to allergies, but denied fever or chills. Denies chest pain. On arrival of EMS O2 sats was 85% on room air improving to the high 90s on nonrebreather.  ED Course:On arrival in the emergency room he was in acute respiratory distress with increased work of breathing. He was tachycardic at 112 tachypneic at 26 with O2 sat 92% on O2 at 4 L. BP 157/92. Blood work significant for troponin 50, BNP 1424, D-dimer 617, procalcitonin less than 0.1. WBC WNL. Creatinine 1.27 which is about his baseline. Chest x-ray showed new diffuse bilateral infiltrates consistent with multifocal pneumonia. Covid negative EKGas interpreted by YF:VCBSW tachycardia with nonspecific ST-T wave changes Patient treated  with IV Lasix. Hospitalist consulted for admission.  **Interim History  Patient was found to have acute on chronic diastolic and systolic CHF and he is being currently diuresed and improving.  However he is slightly hypotensive and was orthostatic the day before yesterday.  We will continue diuresis as tolerated and appreciate cardiology following.  His IV diuresis is now stopped and cardiology recommending continuing losartan at 12.5 mg daily, carvedilol 3.125 mg p.o. twice daily, and Lasix 20 g p.o. every other day at discharge. Have ordered TED hose and abdominal binder for his orthostasis has improved his blood pressure.  PT OT recommending home health now that he is cleared from a cardiac perspective.  Patient orthostatics were obtained this morning and he was not orthostatic.  Discharge Diagnoses:  Principal Problem:   Acute on chronic diastolic CHF (congestive heart failure) (HCC) Active Problems:   Acute respiratory failure with hypoxia (HCC)   Moderate to severe aortic stenosis   H/O carotid endarterectomy   Elevated troponin   Chronic kidney disease, stage 3a  Acute respiratory failure with hypoxia in the setting of acute on chronic systolic and diastolic CHF with ? Concomitant Multifocal PNA, improved -On admission presented with respiratory failure hypoxia satting 85% on room air Initiated on nonrebreather mask, tapered to 4 L of oxygen -Respiratory failure postoperative be due to CHF exacerbation, multifocal pneumonia -SARS-CoV-2 negative -Continue to improve, patient has been weaned off supplemental oxygen 4 L now to room air, satting 99% yesterday and today is off of oxygen again -SpO2: 96 % O2 Flow Rate (L/min): 1 L/min; he has not on any oxygen today -Still complaining shortness of breath with exertion -Yesterday morning complaining of dizziness blood pressure low -Continue  pulmonary toiletry, as needed DuoNeb -Likely exacerbated by CHF exacerbation possible  pneumonia -Elevated D-dimer (Fibrin Derivatives was 617.32), VQ scan negative for pulmonary embolism -2D cardiogram reviewed consistent withsystolic and diastolicCHF, LVH,hypokinesis -Initiating empiric antibiotics ceftriaxone azithromycin will change to p.o. Ceftin near complete after 5 days given he is afebrile and off of oxygen now; will change to p.o. antibiotics and he has 2 more days of Abx -Continuous pulse oximetry maintain O2 saturations greater than 90% -Continue supplemental oxygen via nasal cannula and wean O2 as tolerated -PT OT recommending home health at discharge -Repeat CXR today showed "Stable patchy bilateral airspace opacity."  Hypotension -Check Orthostatic Vital Signs and he was not orthostatic today -Diuresis as tolerated as well as we will start as tolerated -We will order TED hose and Abdominal Binder -Repeat orthostatic vital signs today and he did not drop -Blood pressure was 105/79 prior to discharge -Appreciate cardiology recommendations and he will follow up with them on Tuesday  Acute on Chronic combined new systolic and Grade 1 Diastolic CHF with an EF of 35-40% -2D echocardiogram reviewed, ejection fraction 35-40%, LVH, global hypokinesis, -Cardiology following and they started the patient on carvedilol and I changed his lisinopril to losartan; because of his history of CKD stage III there holding the starting spironolactone at this time and recommended IV diuretics yesterday transitioning to oral diuretics but he still remains on IV diuresis and cardiology recommends continue IV diuresis as tolerated as well as continue losartan as tolerated -Has been on Lasix-with the goal of diuresing -Lasix and lisinopril on hold today due to hypotension and lisinopril has been changed to losartan as above -Strict I's and O's and daily weights; patient is -1.861 L since admission and weight is down 9 pounds -BNP: 1424.7-> 1198.1 -> 932.2 today is 624.6 -Low-dose IV  Lasix given the aortic stenosis and currently getting IV 20 mg q. 12 -Currently receiving carvedilol 3.125 mg p.o. twice daily, losartan 25 mg p.o. daily but will be changed to 12.5 mg p.o. daily; IV Lasix has now been discontinued and he will be discharged on 20 mg of p.o. Lasix every other day -Cardiologist consulted, following appreciate recommendations and he has a follow-up appointment in the outpatient setting on Tuesday  Elevated Troponin -Denies any chest pain -Troponin of 50 >> 75 -Patient had low risk nuclear stress test October 2020 -Cardiology following and C/w Carvedilol and Losartan; continue with carvedilol 3.125 mg p.o. twice daily and losartan 12.5 mg p.o. daily -Home medications include ASA 81 mg and will resume   Moderate to Severe Aortic Stenosis.  Carotid stenosis and Hx of Right Sided Carotid Artery Occlusion -Avoid aggressive diuresis -C/w ASA at discharge -H/O carotid endarterectomy  Hyponatremia -Mild at 134 in the setting of diuresis -Continue monitor and trend in the outpatient setting  Chronic kidney disease, stage 3a -Renal function at baseline and slightly improved -BUN/Cr went from 15/1.27 -> 19/1.24 -> 22/1.20 -> 24/1.34 -Currently also on losartan and getting IV Lasix 20 mg every 12 hours; his Lasix IV has been discontinued now and cardiology is recommended changing his losartan to 12.5 mg p.o. daily and taking Lasix p.o. 20 mg every other day -Avoid further nephrotoxic medications, contrast dyes, hypotension and renally dose medications -repeat CMP within 1 week  GERD -C/w Omeprazole Substitution with Pantoprazole 40 mg po Daily  Discharge Instructions  Discharge Instructions    Call MD for:  difficulty breathing, headache or visual disturbances   Complete by: As directed    Call  MD for:  extreme fatigue   Complete by: As directed    Call MD for:  hives   Complete by: As directed    Call MD for:  persistant dizziness or light-headedness    Complete by: As directed    Call MD for:  persistant nausea and vomiting   Complete by: As directed    Call MD for:  redness, tenderness, or signs of infection (pain, swelling, redness, odor or green/yellow discharge around incision site)   Complete by: As directed    Call MD for:  severe uncontrolled pain   Complete by: As directed    Call MD for:  temperature >100.4   Complete by: As directed    Diet - low sodium heart healthy   Complete by: As directed    Discharge instructions   Complete by: As directed    You were cared for by a hospitalist during your hospital stay. If you have any questions about your discharge medications or the care you received while you were in the hospital after you are discharged, you can call the unit and ask to speak with the hospitalist on call if the hospitalist that took care of you is not available. Once you are discharged, your primary care physician will handle any further medical issues. Please note that NO REFILLS for any discharge medications will be authorized once you are discharged, as it is imperative that you return to your primary care physician (or establish a relationship with a primary care physician if you do not have one) for your aftercare needs so that they can reassess your need for medications and monitor your lab values.  Follow up with PCP and Cardiology within 1-2 weeks. Take all medications as prescribed. If symptoms change or worsen please return to the ED for evaluation   Increase activity slowly   Complete by: As directed      Allergies as of 07/01/2020      Reactions   Horse-derived Products Anaphylaxis      Medication List    TAKE these medications   acetaminophen 325 MG tablet Commonly known as: TYLENOL Take 650 mg by mouth every 6 (six) hours as needed for mild pain, fever or headache.   aspirin EC 81 MG tablet Take 1 tablet (81 mg total) by mouth daily.   azithromycin 250 MG tablet Commonly known as:  ZITHROMAX Take 2 tablets (500 mg total) by mouth daily for 2 days.   carvedilol 3.125 MG tablet Commonly known as: COREG Take 1 tablet (3.125 mg total) by mouth 2 (two) times daily with a meal.   cefdinir 300 MG capsule Commonly known as: OMNICEF Take 1 capsule (300 mg total) by mouth every 12 (twelve) hours for 4 doses.   cholecalciferol 25 MCG (1000 UNIT) tablet Commonly known as: VITAMIN D3 Take 1,000 Units by mouth daily.   diphenhydrAMINE 25 MG tablet Commonly known as: BENADRYL Take 25 mg by mouth daily.   fluticasone 50 MCG/ACT nasal spray Commonly known as: FLONASE Place 2 sprays into both nostrils daily.   furosemide 20 MG tablet Commonly known as: LASIX Take 1 tablet (20 mg total) by mouth every other day. What changed:   when to take this  reasons to take this   ipratropium 0.06 % nasal spray Commonly known as: ATROVENT Place 2 sprays into both nostrils at bedtime.   losartan 25 MG tablet Commonly known as: COZAAR Take 0.5 tablets (12.5 mg total) by mouth daily. Start taking on: July 02, 2020   omeprazole 20 MG capsule Commonly known as: PRILOSEC Take 20 mg by mouth at bedtime.   PREVAGEN EXTRA STRENGTH PO Take 1 tablet by mouth every evening. Chewables       Follow-up Information    [facility] Follow up on 07/06/2020.   Specialty: Cardiology Why: at 1:30pm. Enter through the Medical Mall entrance Contact information: [address] [place] [address] [phone]       Call Laqueta Linden, MD.   Specialty: Cardiology Why:  Patient already has an appointment .Marland Kitchen 07/15/20  @ 1:30pm.. The Doctor's office will send a reminder in the mail.       Call Laurier Nancy, MD.   Specialty: Cardiology Why: Follow up within 1-2 weeks. Dr. Welton Flakes scheduling appointment for you for Tuesday 9/28 at 10:00 AM Contact information: [address] [place]  [address] [phone]        [facility] Follow up.   Specialty: Home Health Services Why: Physical Therapy, Occupational Therapy, Nurse             Allergies  Allergen Reactions  . Horse-Derived Products Anaphylaxis    Consultations:  Cardiology  Procedures/Studies: DG Chest 1 View  Result Date: 07/01/2020 CLINICAL DATA:  Shortness of breath EXAM: CHEST  1 VIEW COMPARISON:  06/28/2020 FINDINGS: Cardiac shadow is stable. Aortic calcifications are again seen. Patchy airspace opacities are again identified but improved from the prior study. No sizable effusion is seen. No bony abnormality is noted. IMPRESSION: Stable patchy bilateral airspace opacity. Electronically Signed   By: Alcide Clever M.D.   On: 07/01/2020 03:51   DG Chest 1 View  Result Date: 06/28/2020 CLINICAL DATA:  Shortness of breath for 2 weeks EXAM: CHEST  1 VIEW COMPARISON:  06/18/2019 FINDINGS: Cardiac shadow is stable. Aortic calcifications are noted. Diffuse bilateral infiltrates are identified. No sizable effusion is seen. No bony abnormality is noted. IMPRESSION: New diffuse bilateral infiltrates consistent with multifocal pneumonia. Electronically Signed   By: Alcide Clever M.D.   On: 06/28/2020 01:57   NM Pulmonary Perfusion  Result Date: 06/28/2020 CLINICAL DATA:  Positive D-dimer, shortness of breath EXAM: NUCLEAR MEDICINE PERFUSION LUNG SCAN TECHNIQUE: Perfusion images were obtained in multiple projections after intravenous injection of radiopharmaceutical. Ventilation scans intentionally deferred if perfusion scan and chest x-ray adequate for interpretation during COVID 19 epidemic. RADIOPHARMACEUTICALS:  3.82 mCi Tc-20m MAA IV COMPARISON:  06/28/2020 FINDINGS: Planar images of the chest are obtained during the perfusion phase of the exam in multiple projections. There is a small peripheral perfusion defect within the right upper lobe. This is smaller than the corresponding opacity on chest  x-ray. No other wedge-shaped perfusion defects are observed. IMPRESSION: 1. No evidence of pulmonary embolus based on PISAPED criteria. Electronically Signed   By: Sharlet Salina M.D.   On: 06/28/2020 15:11   ECHOCARDIOGRAM COMPLETE BUBBLE STUDY  Result Date: 06/28/2020    ECHOCARDIOGRAM REPORT   Patient Name:   Jeffery Smith Date of Exam: 06/28/2020 Medical Rec #:  [id number]            Height:       73.0 in Accession #:    [id number]           Weight:       175.0 lb Date of Birth:  [birth year]            BSA:  2.033 m Patient Age:    89 years             BP:           99/82 mmHg Patient Gender: M                    HR:           44 bpm. Exam Location:  [facility] Procedure: 2D Echo, Cardiac Doppler, Color Doppler and Saline Contrast Bubble            Study Indications:     Shortness of breath, Elevated brain natriuretic peptide  History:         Patient has prior history of Echocardiogram examinations, most                  recent 07/16/2019. Hypertrophic Cardiomyopathy; Risk                  Factors:Dyslipidemia. Dysrhythmia.  Sonographer:     Cristela Blue RDCS (AE) Referring Phys:  [id number] Zerita Boers SMITH Diagnosing Phys: Adrian Blackwater MD  Sonographer Comments: Technically challenging study due to limited acoustic windows and no parasternal window. IMPRESSIONS  1. Left ventricular ejection fraction, by estimation, is 35 to 40%. The left ventricle has moderately decreased function. The left ventricle demonstrates global hypokinesis. There is moderate concentric left ventricular hypertrophy. Left ventricular diastolic parameters are consistent with Grade I diastolic dysfunction (impaired relaxation).  2. Right ventricular systolic function is low normal. The right ventricular size is mildly enlarged.  3. Left atrial size was moderately dilated.  4. Right atrial size was moderately dilated.  5. The mitral valve is degenerative. Mild mitral valve regurgitation. No evidence of mitral stenosis. Moderate to  severe mitral annular calcification.  6. The aortic valve is calcified. Aortic valve regurgitation is mild to moderate. Moderate to severe aortic valve stenosis.  7. The inferior vena cava is normal in size with greater than 50% respiratory variability, suggesting right atrial pressure of 3 mmHg.  8. Agitated saline contrast bubble study was negative, with no evidence of any interatrial shunt. FINDINGS  Left Ventricle: Left ventricular ejection fraction, by estimation, is 35 to 40%. The left ventricle has moderately decreased function. The left ventricle demonstrates global hypokinesis. The left ventricular internal cavity size was normal in size. There is moderate concentric left ventricular hypertrophy. Left ventricular diastolic parameters are consistent with Grade I diastolic dysfunction (impaired relaxation). Right Ventricle: The right ventricular size is mildly enlarged. No increase in right ventricular wall thickness. Right ventricular systolic function is low normal. Left Atrium: Left atrial size was moderately dilated. Right Atrium: Right atrial size was moderately dilated. Pericardium: There is no evidence of pericardial effusion. Mitral Valve: The mitral valve is degenerative in appearance. Moderate to severe mitral annular calcification. Mild mitral valve regurgitation. No evidence of mitral valve stenosis. Tricuspid Valve: The tricuspid valve is normal in structure. Tricuspid valve regurgitation is mild . No evidence of tricuspid stenosis. Aortic Valve: The aortic valve is calcified. Aortic valve regurgitation is mild to moderate. Moderate to severe aortic stenosis is present. Aortic valve mean gradient measures 24.0 mmHg. Aortic valve peak gradient measures 44.2 mmHg. Aortic valve area, by VTI measures 0.52 cm. Pulmonic Valve: The pulmonic valve was normal in structure. Pulmonic valve regurgitation is mild. No evidence of pulmonic stenosis. Aorta: The aortic root is normal in size and structure.  Venous: The inferior vena cava is normal in size with greater than 50% respiratory  variability, suggesting right atrial pressure of 3 mmHg. IAS/Shunts: No atrial level shunt detected by color flow Doppler. Agitated saline contrast was given intravenously to evaluate for intracardiac shunting. Agitated saline contrast bubble study was negative, with no evidence of any interatrial shunt.  LEFT VENTRICLE PLAX 2D LVIDd:         4.64 cm      Diastology LVIDs:         3.63 cm      LV e' medial:    4.90 cm/s LV PW:         1.35 cm      LV E/e' medial:  14.0 LV IVS:        1.28 cm      LV e' lateral:   4.68 cm/s LVOT diam:     2.00 cm      LV E/e' lateral: 14.7 LV SV:         32 LV SV Index:   16 LVOT Area:     3.14 cm  LV Volumes (MOD) LV vol d, MOD A2C: 105.0 ml LV vol d, MOD A4C: 140.0 ml LV vol s, MOD A2C: 75.0 ml LV vol s, MOD A4C: 85.6 ml LV SV MOD A2C:     30.0 ml LV SV MOD A4C:     140.0 ml LV SV MOD BP:      42.0 ml RIGHT VENTRICLE RV Basal diam:  4.55 cm RV S prime:     12.40 cm/s TAPSE (M-mode): 4.0 cm LEFT ATRIUM             Index       RIGHT ATRIUM           Index LA diam:        3.10 cm 1.52 cm/m  RA Area:     21.70 cm LA Vol (A2C):   47.0 ml 23.12 ml/m RA Volume:   68.60 ml  33.74 ml/m LA Vol (A4C):   32.5 ml 15.99 ml/m LA Biplane Vol: 39.4 ml 19.38 ml/m  AORTIC VALVE AV Area (Vmax):    0.69 cm AV Area (Vmean):   0.56 cm AV Area (VTI):     0.52 cm AV Vmax:           332.25 cm/s AV Vmean:          221.250 cm/s AV VTI:            0.623 m AV Peak Grad:      44.2 mmHg AV Mean Grad:      24.0 mmHg LVOT Vmax:         73.10 cm/s LVOT Vmean:        39.300 cm/s LVOT VTI:          0.103 m LVOT/AV VTI ratio: 0.17  AORTA Ao Root diam: 3.10 cm MITRAL VALVE               TRICUSPID VALVE MV Area (PHT): 4.44 cm    TR Peak grad:   33.4 mmHg MV Decel Time: 171 msec    TR Vmax:        289.00 cm/s MV E velocity: 68.60 cm/s MV A velocity: 96.00 cm/s  SHUNTS MV E/A ratio:  0.71        Systemic VTI:  0.10 m                             Systemic Diam: 2.00 cm [employer]  Welton FlakesKhan MD Electronically signed by Adrian BlackwaterShaukat Khan MD Signature Date/Time: 06/28/2020/5:03:55 PM    Final      Subjective: Seen and examined at bedside and he was doing much better today.  Off of oxygen and did not feel symptomatic underwent orthostatic vital signs.  Cardiology cleared him from their perspective and is stable to be discharged home with home health PT and OT and will follow up with PCP and cardiology within 1 to 2 weeks he is understandable agreeable with plan of care.  Discharge Exam: Vitals:   07/01/20 1235 07/01/20 1429  BP: 105/79   Pulse: 62   Resp: 18   Temp: 97.6 F (36.4 C)   SpO2: 93% 96%   Vitals:   07/01/20 0523 07/01/20 0910 07/01/20 1235 07/01/20 1429  BP: 114/78 (!) 86/52 105/79   Pulse: 76 (!) 58 62   Resp: 17 18 18    Temp: (!) 97.5 F (36.4 C) 97.6 F (36.4 C) 97.6 F (36.4 C)   TempSrc: Oral Oral    SpO2: 92% 95% 93% 96%  Weight: 75.5 kg     Height:       General: Pt is alert, awake, not in acute distress Cardiovascular: RRR, S1/S2 +, no rubs, no gallops Respiratory: Diminished bilaterally with coarse, no wheezing, no rhonchi; unlabored breathing Abdominal: Soft, NT, ND, bowel sounds + Extremities: Minimal edema, no cyanosis  The results of significant diagnostics from this hospitalization (including imaging, microbiology, ancillary and laboratory) are listed below for reference.    Microbiology: Recent Results (from the past 240 hour(s))  SARS Coronavirus 2 by RT PCR (hospital order, [facility]) Nasopharyngeal Nasopharyngeal Swab     Status: None   Collection Time: 06/28/20  1:31 AM   Specimen: Nasopharyngeal Swab  Result Value Ref Range Status   SARS Coronavirus 2 NEGATIVE NEGATIVE Final    Comment: (NOTE) SARS-CoV-2 target nucleic acids are NOT DETECTED.  The SARS-CoV-2 RNA is generally detectable in upper and lower respiratory specimens during the acute phase of  infection. The lowest concentration of SARS-CoV-2 viral copies this assay can detect is 250 copies / mL. A negative result does not preclude SARS-CoV-2 infection and should not be used as the sole basis for treatment or other patient management decisions.  A negative result may occur with improper specimen collection / handling, submission of specimen other than nasopharyngeal swab, presence of viral mutation(s) within the areas targeted by this assay, and inadequate number of viral copies (<250 copies / mL). A negative result must be combined with clinical observations, patient history, and epidemiological information.  Fact Sheet for Patients:   BoilerBrush.com.cyhttps://www.fda.gov/media/136312/download  Fact Sheet for Healthcare Providers: https://pope.com/https://www.fda.gov/media/136313/download  This test is not yet approved or  cleared by the [place] FDA and has been authorized for detection and/or diagnosis of SARS-CoV-2 by FDA under an Emergency Use Authorization (EUA).  This EUA will remain in effect (meaning this test can be used) for the duration of the COVID-19 declaration under Section 564(b)(1) of the Act, 21 U.S.C. section 360bbb-3(b)(1), unless the authorization is terminated or revoked sooner.  Performed at [facility], [address] [place], [place] [address]   CULTURE, BLOOD (ROUTINE X 2) w Reflex to ID Panel     Status: None (Preliminary result)   Collection Time: 06/28/20 11:22 AM   Specimen: BLOOD RIGHT ARM  Result Value Ref Range Status   Specimen Description BLOOD RIGHT ARM  Final   Special Requests   Final  BOTTLES DRAWN AEROBIC AND ANAEROBIC Blood Culture adequate volume   Culture   Final    NO GROWTH 3 DAYS Performed at [facility], [address] [place], [place] [address]    Report Status PENDING  Incomplete  CULTURE, BLOOD (ROUTINE X 2) w Reflex to ID Panel     Status: None (Preliminary result)   Collection Time: 06/28/20 11:23 AM   Specimen:  BLOOD LEFT ARM  Result Value Ref Range Status   Specimen Description BLOOD LEFT ARM  Final   Special Requests   Final    BOTTLES DRAWN AEROBIC AND ANAEROBIC Blood Culture adequate volume   Culture   Final    NO GROWTH 3 DAYS Performed at [facility], [address] [place], [place] [address]    Report Status PENDING  Incomplete    Labs: BNP (last 3 results) Recent Labs    06/29/20 0426 06/30/20 0446 07/01/20 0350  BNP 1,198.1* 932.2* 624.6*   Basic Metabolic Panel: Recent Labs  Lab 06/28/20 0131 06/29/20 0426 06/30/20 0446 07/01/20 0350  NA 138 137 138 134*  K 4.3 3.9 3.9 4.0  CL 105 102 101 99  CO2 GLUCOSE 136* 96 95 104*  BUN 24*  CREATININE 1.27* 1.24 1.20 1.34*  CALCIUM 9.0 8.9 8.9 9.2  MG 2.1  --   --  2.3  PHOS  --   --   --  3.9   Liver Function Tests: Recent Labs  Lab 06/28/20 0131 06/29/20 0426 07/01/20 0350  AST 20  --  16  ALT 17  --  12  ALKPHOS 67  --  54  BILITOT 0.9  --  0.8  PROT 6.8  --  6.0*  ALBUMIN 3.9 3.3* 3.2*   No results for input(s): LIPASE, AMYLASE in the last 168 hours. No results for input(s): AMMONIA in the last 168 hours. CBC: Recent Labs  Lab 06/28/20 0131 07/01/20 0350  WBC 10.0 7.8  NEUTROABS 7.3 5.0  HGB 13.9 13.7  HCT 42.2 39.2  MCV 98.6 93.8  PLT 194 173   Cardiac Enzymes: No results for input(s): CKTOTAL, CKMB, CKMBINDEX, TROPONINI in the last 168 hours. BNP: Invalid input(s): POCBNP CBG: Recent Labs  Lab 06/30/20 2058 07/01/20 0824  GLUCAP 94 103*   D-Dimer No results for input(s): DDIMER in the last 72 hours. Hgb A1c No results for input(s): HGBA1C in the last 72 hours. Lipid Profile No results for input(s): CHOL, HDL, LDLCALC, TRIG, CHOLHDL, LDLDIRECT in the last 72 hours. Thyroid function studies No results for input(s): TSH, T4TOTAL, T3FREE, THYROIDAB in the last 72 hours.  Invalid input(s): FREET3 Anemia work up No results for input(s): VITAMINB12,  FOLATE, FERRITIN, TIBC, IRON, RETICCTPCT in the last 72 hours. Urinalysis    Component Value Date/Time   COLORURINE YELLOW 09/14/2014 0920   APPEARANCEUR CLEAR 09/14/2014 0920   LABSPEC 1.019 09/14/2014 0920   PHURINE 5.5 09/14/2014 0920   GLUCOSEU NEGATIVE 09/14/2014 0920   HGBUR NEGATIVE 09/14/2014 0920   BILIRUBINUR neg 11/04/2019 0903   KETONESUR NEGATIVE 09/14/2014 0920   PROTEINUR Negative 11/04/2019 0903   PROTEINUR NEGATIVE 09/14/2014 0920   UROBILINOGEN negative (A) 11/04/2019 0903   UROBILINOGEN 0.2 09/14/2014 0920   NITRITE neg 11/04/2019 0903   NITRITE NEGATIVE 09/14/2014 0920   LEUKOCYTESUR Negative 11/04/2019 0903   Sepsis Labs Invalid input(s): PROCALCITONIN,  WBC,  LACTICIDVEN Microbiology Recent Results (from the past 240 hour(s))  SARS Coronavirus 2  by RT PCR (hospital order, performed in [facility] hospital lab) Nasopharyngeal Nasopharyngeal Swab     Status: None   Collection Time: 06/28/20  1:31 AM   Specimen: Nasopharyngeal Swab  Result Value Ref Range Status   SARS Coronavirus 2 NEGATIVE NEGATIVE Final    Comment: (NOTE) SARS-CoV-2 target nucleic acids are NOT DETECTED.  The SARS-CoV-2 RNA is generally detectable in upper and lower respiratory specimens during the acute phase of infection. The lowest concentration of SARS-CoV-2 viral copies this assay can detect is 250 copies / mL. A negative result does not preclude SARS-CoV-2 infection and should not be used as the sole basis for treatment or other patient management decisions.  A negative result may occur with improper specimen collection / handling, submission of specimen other than nasopharyngeal swab, presence of viral mutation(s) within the areas targeted by this assay, and inadequate number of viral copies (<250 copies / mL). A negative result must be combined with clinical observations, patient history, and epidemiological information.  Fact Sheet for Patients:    BoilerBrush.com.cy  Fact Sheet for Healthcare Providers: https://pope.com/  This test is not yet approved or  cleared by the [place] FDA and has been authorized for detection and/or diagnosis of SARS-CoV-2 by FDA under an Emergency Use Authorization (EUA).  This EUA will remain in effect (meaning this test can be used) for the duration of the COVID-19 declaration under Section 564(b)(1) of the Act, 21 U.S.C. section 360bbb-3(b)(1), unless the authorization is terminated or revoked sooner.  Performed at [facility], [address] [place], [place] [address]   CULTURE, BLOOD (ROUTINE X 2) w Reflex to ID Panel     Status: None (Preliminary result)   Collection Time: 06/28/20 11:22 AM   Specimen: BLOOD RIGHT ARM  Result Value Ref Range Status   Specimen Description BLOOD RIGHT ARM  Final   Special Requests   Final    BOTTLES DRAWN AEROBIC AND ANAEROBIC Blood Culture adequate volume   Culture   Final    NO GROWTH 3 DAYS Performed at [facility], [address], [place], [place] [address]    Report Status PENDING  Incomplete  CULTURE, BLOOD (ROUTINE X 2) w Reflex to ID Panel     Status: None (Preliminary result)   Collection Time: 06/28/20 11:23 AM   Specimen: BLOOD LEFT ARM  Result Value Ref Range Status   Specimen Description BLOOD LEFT ARM  Final   Special Requests   Final    BOTTLES DRAWN AEROBIC AND ANAEROBIC Blood Culture adequate volume   Culture   Final    NO GROWTH 3 DAYS Performed at [facility], [address], [place], [place] [address]    Report Status PENDING  Incomplete   Time coordinating discharge: 35 minutes  SIGNED:  Merlene Laughter, DO [facility] 07/01/2020, 6:48 PM Pager is on AMION  If 7PM-7AM, please contact night-coverage [email or web address]

## 2020-07-01 NOTE — Progress Notes (Signed)
SUBJECTIVE: Patient is feeling much better   Vitals:   06/30/20 2106 06/30/20 2107 07/01/20 0109 07/01/20 0523  BP: 94/69 102/60 93/66 114/78  Pulse:   60 76  Resp:   18 17  Temp:   97.6 F (36.4 C) (!) 97.5 F (36.4 C)  TempSrc:   Oral Oral  SpO2:   96% 92%  Weight:    75.5 kg  Height:        Intake/Output Summary (Last 24 hours) at 07/01/2020 0900 Last data filed at 07/01/2020 0829 Gross per 24 hour  Intake 246 ml  Output 1000 ml  Net -754 ml    LABS: Basic Metabolic Panel: Recent Labs    06/30/20 0446 07/01/20 0350  NA 138 134*  K 3.9 4.0  CL 101 99  CO2 26 26  GLUCOSE 95 104*  BUN 22 24*  CREATININE 1.20 1.34*  CALCIUM 8.9 9.2  MG  --  2.3  PHOS  --  3.9   Liver Function Tests: Recent Labs    06/29/20 0426 07/01/20 0350  AST  --  16  ALT  --  12  ALKPHOS  --  54  BILITOT  --  0.8  PROT  --  6.0*  ALBUMIN 3.3* 3.2*   No results for input(s): LIPASE, AMYLASE in the last 72 hours. CBC: Recent Labs    07/01/20 0350  WBC 7.8  NEUTROABS 5.0  HGB 13.7  HCT 39.2  MCV 93.8  PLT 173   Cardiac Enzymes: No results for input(s): CKTOTAL, CKMB, CKMBINDEX, TROPONINI in the last 72 hours. BNP: Invalid input(s): POCBNP D-Dimer: No results for input(s): DDIMER in the last 72 hours. Hemoglobin A1C: No results for input(s): HGBA1C in the last 72 hours. Fasting Lipid Panel: No results for input(s): CHOL, HDL, LDLCALC, TRIG, CHOLHDL, LDLDIRECT in the last 72 hours. Thyroid Function Tests: No results for input(s): TSH, T4TOTAL, T3FREE, THYROIDAB in the last 72 hours.  Invalid input(s): FREET3 Anemia Panel: No results for input(s): VITAMINB12, FOLATE, FERRITIN, TIBC, IRON, RETICCTPCT in the last 72 hours.   PHYSICAL EXAM General: Well developed, well nourished, in no acute distress HEENT:  Normocephalic and atramatic Neck:  No JVD.  Lungs: Clear bilaterally to auscultation and percussion. Heart: HRRR . Normal S1 and S2 without gallops or murmurs.   Abdomen: Bowel sounds are positive, abdomen soft and non-tender  Msk:  Back normal, normal gait. Normal strength and tone for age. Extremities: No clubbing, cyanosis or edema.   Neuro: Alert and oriented X 3. Psych:  Good affect, responds appropriately  TELEMETRY: Sinus rhythm  ASSESSMENT AND PLAN: Congestive heart failure with diastolic dysfunction.  Patient is feeling much better can be discharged with follow-up next Tuesday at 10:00 in my office at [facility].  Principal Problem:   Acute on chronic diastolic CHF (congestive heart failure) (HCC) Active Problems:   Acute respiratory failure with hypoxia (HCC)   Moderate to severe aortic stenosis   H/O carotid endarterectomy   Elevated troponin   Chronic kidney disease, stage 3a    Arty Lantzy A, MD, [facility] 07/01/2020 9:00 AM

## 2020-07-01 NOTE — Progress Notes (Signed)
  Heart Failure Nurse Navigator Note  HFrEF 35-40 %.  By echocardiogram completed on this admission.  Noted also moderate to severe aortic stenosis.  Ef previously reported at 60-65 % in October of 2020.  He presented to the ED by way of EMS with a two week history of progressively worsening SOB coughing and expectorating pink tinged sputum and wheezing.  02 saturations on room air 85%.   Comorbidities:  Moderate to severe Aortic stenosis CKD stage 3 Osteoarthritis Coronary artery disease  Medications;  ASA 81 mg daily Coreg 3.125 mg BID Cozaar 25 mg daily  Labs:  Sodium 134, potassium 4.0, BUN 24(yesterday 22), creatinine 1.34( yesterday 1.2), magnesium 2.3. Bnp 624(yesterday 932)  Weight-75.5 kg (yesterday 74.6)  Intake-246 ml Output -1000 ml  Orthostatic blood pressure today lying 91/76 pulse 99, sitting 99/40 pulse 90, standing 97/70 pulse 91 and standing for 3 minutes 87/53 pulse 90.  Assessment:  General -he is awake and alert, lying in bed. He denies any increasing SOB, PND or orthopnea.  No chest pain, dizziness or lightheadedness noted with ambulation in hall.  HEENT- pupils are equal, HOH.  Neck- no JVD  Cardiac- heart tones are regular.  Respiratory-breath sounds clear to posterior auscultation.  GI-abdomen soft, + bowel sounds  Psych- pleasant and appropriate.  Discussed follow up with patient, explaining that he would follow up with cardiologist and also with Clarisa Kindred NP in the heart failure clinic that is located in [facility] - second floor.  They state at this time they do not have any further questions.  They have the teaching booklet and magnet.   Tresa Endo RN, CHFN

## 2020-07-01 NOTE — TOC Initial Note (Signed)
Transition of Care [facility]) - Initial/Assessment Note    Patient Details  Name: Jeffery Smith MRN: [id number] Date of Birth: Aug 16, 1925  Transition of Care [facility]) CM/SW Contact:    Maree Krabbe, LCSW Phone Number: 07/01/2020, 1:54 PM  Clinical Narrative:    HH arranged through [facility].               Expected Discharge Plan: Home w Home Health Services Barriers to Discharge: No Barriers Identified   Patient Goals and CMS Choice Patient states their goals for this hospitalization and ongoing recovery are:: for pt to get better      Expected Discharge Plan and Services Expected Discharge Plan: Home w Home Health Services In-house Referral: Clinical Social Work   Post Acute Care Choice: Home Health Living arrangements for the past 2 months: Single Family Home Expected Discharge Date: 07/01/20                                    Prior Living Arrangements/Services Living arrangements for the past 2 months: Single Family Home Lives with:: Spouse Patient language and need for interpreter reviewed:: Yes Do you feel safe going back to the place where you live?: Yes      Need for Family Participation in Patient Care: Yes (Comment) Care giver support system in place?: Yes (comment)   Criminal Activity/Legal Involvement Pertinent to Current Situation/Hospitalization: No - Comment as needed  Activities of Daily Living      Permission Sought/Granted Permission sought to share information with : Family Supports    Share Information with NAME: betty     Permission granted to share info w Relationship: spouse     Emotional Assessment Appearance:: Appears stated age Attitude/Demeanor/Rapport: Engaged Affect (typically observed): Accepting, Appropriate Orientation: : Oriented to Self, Oriented to Place, Oriented to  Time, Oriented to Situation Alcohol / Substance Use: Not Applicable Psych Involvement: No (comment)  Admission diagnosis:  Cough  [R05] Abnormal ECG [R94.31] Acute CHF (congestive heart failure) (HCC) [I50.9] Elevated brain natriuretic peptide (BNP) level [R79.89] Acute respiratory failure with hypoxia (HCC) [J96.01] Troponin I above reference range [R77.8] RBBB [I45.10] Acute on chronic heart failure, unspecified heart failure type [facility]) [I50.9] Patient Active Problem List   Diagnosis Date Noted  . Acute CHF (congestive heart failure) (HCC) 06/28/2020  . Acute respiratory failure with hypoxia (HCC) 06/28/2020  . Acute on chronic diastolic CHF (congestive heart failure) (HCC) 06/28/2020  . Moderate to severe aortic stenosis 06/28/2020  . H/O carotid endarterectomy 06/28/2020  . Elevated troponin 06/28/2020  . Chronic kidney disease, stage 3a 06/28/2020  . OA (osteoarthritis) of knee 09/21/2014  . Occlusion and stenosis of carotid artery without mention of cerebral infarction 11/21/2011   PCP:  System, Provider Not In Pharmacy:   [facility], [place] - [place], [place] - [address] [place] [address] Phone: [phone] Fax: [phone]     Social Determinants of Health (SDOH) Interventions    Readmission Risk Interventions No flowsheet data found.

## 2020-07-02 ENCOUNTER — Emergency Department (HOSPITAL_COMMUNITY): Payer: No Typology Code available for payment source

## 2020-07-02 ENCOUNTER — Emergency Department (HOSPITAL_COMMUNITY)
Admission: EM | Admit: 2020-07-02 | Discharge: 2020-07-02 | Disposition: A | Discharge status: Home / Self Care | Payer: No Typology Code available for payment source | Attending: Emergency Medicine | Admitting: Emergency Medicine

## 2020-07-02 ENCOUNTER — Other Ambulatory Visit: Payer: Self-pay

## 2020-07-02 DIAGNOSIS — Z7982 Long term (current) use of aspirin: Secondary | ICD-10-CM | POA: Diagnosis not present

## 2020-07-02 DIAGNOSIS — I959 Hypotension, unspecified: Secondary | ICD-10-CM | POA: Diagnosis present

## 2020-07-02 DIAGNOSIS — I5033 Acute on chronic diastolic (congestive) heart failure: Secondary | ICD-10-CM | POA: Diagnosis not present

## 2020-07-02 DIAGNOSIS — I952 Hypotension due to drugs: Secondary | ICD-10-CM

## 2020-07-02 DIAGNOSIS — Z79899 Other long term (current) drug therapy: Secondary | ICD-10-CM | POA: Insufficient documentation

## 2020-07-02 DIAGNOSIS — Z96651 Presence of right artificial knee joint: Secondary | ICD-10-CM | POA: Diagnosis not present

## 2020-07-02 DIAGNOSIS — N1831 Chronic kidney disease, stage 3a: Secondary | ICD-10-CM | POA: Insufficient documentation

## 2020-07-02 LAB — CBC WITH DIFFERENTIAL/PLATELET
Abs Immature Granulocytes: 0.03 10*3/uL (ref 0.00–0.07)
Basophils Absolute: 0 10*3/uL (ref 0.0–0.1)
Basophils Relative: 1 %
Eosinophils Absolute: 0.4 10*3/uL (ref 0.0–0.5)
Eosinophils Relative: 5 %
HCT: 41.9 % (ref 39.0–52.0)
Hemoglobin: 13.4 g/dL (ref 13.0–17.0)
Immature Granulocytes: 0 %
Lymphocytes Relative: 18 %
Lymphs Abs: 1.4 10*3/uL (ref 0.7–4.0)
MCH: 31.6 pg (ref 26.0–34.0)
MCHC: 32 g/dL (ref 30.0–36.0)
MCV: 98.8 fL (ref 80.0–100.0)
Monocytes Absolute: 0.9 10*3/uL (ref 0.1–1.0)
Monocytes Relative: 12 %
Neutro Abs: 4.9 10*3/uL (ref 1.7–7.7)
Neutrophils Relative %: 64 %
Platelets: 190 10*3/uL (ref 150–400)
RBC: 4.24 MIL/uL (ref 4.22–5.81)
RDW: 13.3 % (ref 11.5–15.5)
WBC: 7.7 10*3/uL (ref 4.0–10.5)
nRBC: 0 % (ref 0.0–0.2)

## 2020-07-02 LAB — COMPREHENSIVE METABOLIC PANEL
ALT: 12 U/L (ref 0–44)
AST: 17 U/L (ref 15–41)
Albumin: 3.3 g/dL — ABNORMAL LOW (ref 3.5–5.0)
Alkaline Phosphatase: 54 U/L (ref 38–126)
Anion gap: 8 (ref 5–15)
BUN: 21 mg/dL (ref 8–23)
CO2: 26 mmol/L (ref 22–32)
Calcium: 9.5 mg/dL (ref 8.9–10.3)
Chloride: 103 mmol/L (ref 98–111)
Creatinine, Ser: 1.44 mg/dL — ABNORMAL HIGH (ref 0.61–1.24)
GFR calc Af Amer: 48 mL/min — ABNORMAL LOW (ref >60)
GFR calc non Af Amer: 42 mL/min — ABNORMAL LOW (ref >60)
Glucose, Bld: 103 mg/dL — ABNORMAL HIGH (ref 70–99)
Potassium: 4.4 mmol/L (ref 3.5–5.1)
Sodium: 137 mmol/L (ref 135–145)
Total Bilirubin: 1.1 mg/dL (ref 0.3–1.2)
Total Protein: 6 g/dL — ABNORMAL LOW (ref 6.5–8.1)

## 2020-07-02 LAB — BRAIN NATRIURETIC PEPTIDE: B Natriuretic Peptide: 752.9 pg/mL — ABNORMAL HIGH (ref 0.0–100.0)

## 2020-07-02 LAB — TROPONIN I (HIGH SENSITIVITY)
Troponin I (High Sensitivity): 21 ng/L — ABNORMAL HIGH (ref <18)
Troponin I (High Sensitivity): 18 ng/L — ABNORMAL HIGH (ref <18)

## 2020-07-02 MED ORDER — SODIUM CHLORIDE 0.9 % IV BOLUS: 500.0000 mL | Freq: Once | INTRAVENOUS | Status: DC

## 2020-07-02 NOTE — Consult Note (Signed)
I discussed this patient with the emergency department provider caring for Jeffery Smith today.  I was asked to provide recommendation on medication management as the emergency department is planning to discharge the patient back to home.  In brief, Mr. Barg is a 86 year old gentleman with a history of diastolic heart failure, moderate to severe aortic stenosis, chronic kidney disease stage IIIa who has been admitted multiple times in the last several months with acute on chronic heart failure exacerbation.  He was recently discharged from [facility] yesterday.  On arrival to home, he was lethargic and had some reported low blood pressures.  His heart failure medication regimen includes carvedilol 3.125 mg twice daily, losartan 12.5 mg twice daily, and Lasix 20 mg every other day.  These medications were held today and his blood pressure significantly improved as did his mental status.  All of his labs checked today were at or near his baseline.  As scheduled and plan follow-up with both primary care and cardiology within the next few days.0  I recommended holding carvedilol and continuing losartan until cardiology follow-up.  At that time if they were to consider beta-blocker therapy, metoprolol may be a better option given his symptomatic hypertension.  Losartan is a very low dose so probably not as impactful.  I also recommended having patient and his wife monitor weights, and if he loses weight quickly to hold the Lasix dose as this could be contributing to hypotension as well.  Given that the plan was to discharge the patient, I did not perform a physical exam.  Gerre Scull, MD  Cardiology moonlighter

## 2020-07-02 NOTE — Discharge Instructions (Addendum)
STOP taking the carvedilol. Continue taking the losartan/cozaar and furosemide/lasix.  Monitor your weight daily.  If you begin losing weight quickly, you can skip a dose of furosemide/Lasix. Attend your appointment with Cardiology on Tuesday for further medication management Return to the ED for worsening symptoms.

## 2020-07-02 NOTE — ED Notes (Signed)
Patient verbalizes understanding of discharge instructions. Opportunity for questioning and answers were provided. Armband removed by staff, pt discharged from ED via wheelchair with wife.  

## 2020-07-02 NOTE — ED Provider Notes (Signed)
[place] EMERGENCY DEPARTMENT Provider Note   CSN: [id number] Arrival date & time: 07/02/20  1541     History Chief Complaint  Patient presents with  . Fatigue    Jeffery Smith is a 86 y.o. male presenting to the ED via EMS from home with hypotension and excessive sleepiness.  Patient was just discharged from [facility] yesterday, admitted for acute respiratory failure due to acute CHF and new onset of A. Fib.  He had elevated troponins during his visit, as high as 75 though thought to be due to demand ischemia and A. fib.  EF was noted to be 35 to 40%.  BNP was trended from 1400 down to 625.  He was also found to have low blood pressures during admission, patient's right states he was laying down for most of his admission due to hypotension worsening when he sat up.  He was ultimately discharged with stable blood pressures in the 67Y systolic, with TED hose and abdominal binder to maintain blood pressures.  he was discharged on 3.125 mg carvedilol twice daily, 12.5 mg losartan twice daily, and 20 mg Lasix every other day, as well as Ceftin empirically for concomitant multifocal pneumonia.  During admission, he had VQ scan for elevated D-dimer which was negative.  He was plan to follow-up cardiology outpatient on Tuesday.  Patient states that he has felt very sleepy today, worse than he felt during admission.  Wife states that he was falling asleep during conversation with the home health nurse today who came to the house for evaluation.  His wife states this morning they went out to [place] of his [place], upon arriving home they met with the home health nurse.  Home health nurse noted blood pressures as low as 70 systolic though improved to 90s by EMS arrival.  Patient was quite sleepy intermittently falling asleep during evaluation.  He denies any new chest pain or shortness of breath.  He has been taking medications as directed.  He denies abdominal complaints.  The history  is provided by the patient, the spouse and medical records.        Past Medical History:  Diagnosis Date  . Arthritis   . Benign head tremor   . Carotid artery occlusion    right side   . Dysrhythmia    irregular heart beat per patient- skips a beat   . Enlarged prostate   . GERD (gastroesophageal reflux disease)   . Hard of hearing   . Hyperlipidemia   . Sore throat     Patient Active Problem List   Diagnosis Date Noted  . Acute CHF (congestive heart failure) ([place]) 06/28/2020  . Acute respiratory failure with hypoxia ([place]) 06/28/2020  . Acute on chronic diastolic CHF (congestive heart failure) ([place]) 06/28/2020  . Moderate to severe aortic stenosis 06/28/2020  . H/O carotid endarterectomy 06/28/2020  . Elevated troponin 06/28/2020  . Chronic kidney disease, stage 3a 06/28/2020  . OA (osteoarthritis) of knee 09/21/2014  . Occlusion and stenosis of carotid artery without mention of cerebral infarction 11/21/2011    Past Surgical History:  Procedure Laterality Date  . CATARACT EXTRACTION     left  . ENDARTERECTOMY  11/03/2011   Procedure: ENDARTERECTOMY CAROTID;  Surgeon: Rosetta Posner, MD;  Location: [facility] OR;  Service: Vascular;  Laterality: Right;  Right Carotid endarterectomy with patch angiopolasty  . TONSILLECTOMY    . TOTAL KNEE ARTHROPLASTY     left  . TOTAL KNEE ARTHROPLASTY Right 09/21/2014  Procedure: RIGHT TOTAL KNEE ARTHROPLASTY;  Surgeon: Gearlean Alf, MD;  Location: [facility] ORS;  Service: Orthopedics;  Laterality: Right;       Family History  Problem Relation Age of Onset  . Cancer Mother   . Cancer Brother     Social History   Tobacco Use  . Smoking status: Never Smoker  . Smokeless tobacco: Never Used  Vaping Use  . Vaping Use: Never used  Substance Use Topics  . Alcohol use: Yes    Comment: 1 glass of wine daily  . Drug use: No    Home Medications Prior to Admission medications   Medication Sig Start Date End Date Taking? Authorizing  Provider  acetaminophen (TYLENOL) 325 MG tablet Take 650 mg by mouth every 6 (six) hours as needed for mild pain, fever or headache.   Yes [provider]  Apoaequorin (PREVAGEN EXTRA STRENGTH PO) Take 1 tablet by mouth See admin instructions. Chew 1 tablet by mouth every evening   Yes [provider]  aspirin EC 81 MG tablet Take 1 tablet (81 mg total) by mouth daily. 10/07/19  Yes Strader, [place] M, PA-C  azithromycin (ZITHROMAX) 250 MG tablet Take 2 tablets (500 mg total) by mouth daily for 2 days. 07/01/20 07/03/20 Yes Sheikh, Omair Latif, DO  carvedilol (COREG) 3.125 MG tablet Take 1 tablet (3.125 mg total) by mouth 2 (two) times daily with a meal. 07/01/20  Yes Sheikh, Omair Latif, DO  cefdinir (OMNICEF) 300 MG capsule Take 1 capsule (300 mg total) by mouth every 12 (twelve) hours for 4 doses. 07/01/20 07/03/20 Yes Sheikh, Omair Latif, DO  cholecalciferol (VITAMIN D3) 25 MCG (1000 UNIT) tablet Take 1,000 Units by mouth daily.   Yes [provider]  fluticasone (FLONASE) 50 MCG/ACT nasal spray Place 2 sprays into both nostrils daily.   Yes [provider]  ipratropium (ATROVENT) 0.06 % nasal spray Place 2 sprays into both nostrils at bedtime.   Yes [provider]  loratadine (CLARITIN) 10 MG tablet Take 10 mg by mouth daily.   Yes [provider]  omeprazole (PRILOSEC) 20 MG capsule Take 20 mg by mouth at bedtime.    Yes [provider]  diphenhydrAMINE (BENADRYL) 25 MG tablet Take 25 mg by mouth daily. Patient not taking: Reported on 07/02/2020    [provider]  furosemide (LASIX) 20 MG tablet Take 1 tablet (20 mg total) by mouth every other day. 07/01/20 09/29/20  Jeffery Noble Latif, DO  losartan (COZAAR) 25 MG tablet Take 0.5 tablets (12.5 mg total) by mouth daily. 07/02/20   Jeffery Noble Latif, DO    Allergies    Horse-derived products  Review of Systems   Review of Systems  All other systems reviewed and are  negative.   Physical Exam Updated Vital Signs BP (!) 141/79   Pulse 73   Temp 97.6 F (36.4 C) (Oral)   Resp 19   Ht 6' 1"  (1.854 m)   Wt 77.1 kg   SpO2 96%   BMI 22.43 kg/m   Physical Exam Vitals and nursing note reviewed.  Constitutional:      General: He is not in acute distress.    Appearance: He is well-developed.  HENT:     Head: Normocephalic and atraumatic.     Mouth/Throat:     Mouth: Mucous membranes are moist.  Eyes:     Conjunctiva/sclera: Conjunctivae normal.  Cardiovascular:     Rate and Rhythm: Normal rate and regular rhythm.  Pulmonary:     Effort: Pulmonary effort is normal. No respiratory distress.  Abdominal:     General: Bowel sounds are normal.     Palpations: Abdomen is soft.     Tenderness: There is no abdominal tenderness. There is no guarding or rebound.  Musculoskeletal:     Comments: Mild BLE edema.  Skin:    General: Skin is warm.  Neurological:     General: No focal deficit present.     Mental Status: He is alert.  Psychiatric:        Behavior: Behavior normal.     ED Results / Procedures / Treatments   Labs (all labs ordered are listed, but only abnormal results are displayed) Labs Reviewed  COMPREHENSIVE METABOLIC PANEL - Abnormal; Notable for the following components:      Result Value   Glucose, Bld 103 (*)    Creatinine, Ser 1.44 (*)    Total Protein 6.0 (*)    Albumin 3.3 (*)    GFR calc non Af Amer 42 (*)    GFR calc Af Amer 48 (*)    All other components within normal limits  BRAIN NATRIURETIC PEPTIDE - Abnormal; Notable for the following components:   B Natriuretic Peptide 752.9 (*)    All other components within normal limits  TROPONIN I (HIGH SENSITIVITY) - Abnormal; Notable for the following components:   Troponin I (High Sensitivity) 21 (*)    All other components within normal limits  TROPONIN I (HIGH SENSITIVITY) - Abnormal; Notable for the following components:   Troponin I (High Sensitivity) 18 (*)     All other components within normal limits  CBC WITH DIFFERENTIAL/PLATELET    EKG EKG Interpretation  Date/Time:  Friday July 02 2020 15:42:42 EDT Ventricular Rate:  65 PR Interval:    QRS Duration: 144 QT Interval:  467 QTC Calculation: 486 R Axis:   -64 Text Interpretation: Sinus rhythm Supraventricular bigeminy Right bundle branch block LVH with IVCD and secondary repol abnrm Borderline prolonged QT interval Confirmed by Dewaine Conger [id number]) on 07/02/2020 5:57:00 PM   Radiology DG Chest 1 View  Result Date: 07/01/2020 CLINICAL DATA:  Shortness of breath EXAM: CHEST  1 VIEW COMPARISON:  06/28/2020 FINDINGS: Cardiac shadow is stable. Aortic calcifications are again seen. Patchy airspace opacities are again identified but improved from the prior study. No sizable effusion is seen. No bony abnormality is noted. IMPRESSION: Stable patchy bilateral airspace opacity. Electronically Signed   By: Inez Catalina M.D.   On: 07/01/2020 03:51   DG Chest 2 View  Result Date: 07/02/2020 CLINICAL DATA:  Lethargy, hypotension EXAM: CHEST - 2 VIEW COMPARISON:  07/01/2020 FINDINGS: Frontal and lateral views of the chest demonstrates stable enlargement of the cardiac silhouette. There is diffuse interstitial scarring throughout the lungs, with trace bilateral pleural effusions. There is persistent airspace disease at the right apex. Remaining areas of consolidation demonstrate significant improvement and resolution. No acute bony abnormalities. IMPRESSION: 1. Findings most consistent with improving pulmonary edema. Residual right upper lobe airspace disease and small bilateral pleural effusions. Electronically Signed   By: Randa Ngo M.D.   On: 07/02/2020 18:24   EMERGENCY DEPARTMENT US SOFT TISSUE INTERPRETATION "Study: Limited Soft Tissue Ultrasound"  Procedures Procedures (including critical care time)   EMERGENCY DEPARTMENT [place] CARDIAC EXAM "Study: Limited Ultrasound of the Heart and  Pericardium"  INDICATIONS:Abnormal vital signs Multiple views of the heart and pericardium were obtained in real-time with a multi-frequency probe.  PERFORMED [id number] IMAGES ARCHIVED?:  Yes LIMITATIONS:  None VIEWS USED: Subcostal 4 chamber, Parasternal long axis, Parasternal short axis, Apical 4 chamber  and Inferior Vena Cava INTERPRETATION: Cardiac activity present, Pericardial effusioin absent, Decreased contractility and unable to fully assess IVC with patients respirations   Medications Ordered in ED Medications - No data to display  ED Course  I have reviewed the triage vital signs and the nursing notes.  Pertinent labs & imaging results that were available during my care of the patient were reviewed by me and considered in my medical decision making (see chart for details).  Clinical Course as of Jul 02 2118  Fri Jul 02, 2020  2044 Consulted with Cardiology, Dr. Marcelle Smiling. Discussed medication management, recommends discontinue carvedilol, continue losartan and lasix. Recommends patient can check weights, if dropping quickly can skip dose of lasix. Appreciate consult.   [JR]    Clinical Course User Index [JR] Landi Biscardi, [place] N, PA-C   MDM Rules/Calculators/A&P                          Patient presenting to the ED after being discharged yesterday from [facility] for hypotension and sleepiness.  He was admitted for respiratory failure with CHF exacerbation, concomitant multifocal PNA, a fib,.  Patient was managed during hospitalization with IV Lasix, carvedilol, losartan.  He was noted to be hypotensive during admission and quite orthostatic but with improvement in stable pressures at discharge with pelvic binder and TED hose.  However today, he became increasingly hypotensive in the 76H systolic.  He had significant daytime drowsiness, falling asleep during conversation.  He has had no other worsening symptoms since discharge yesterday.  He mainly complains of excessive  sleepiness.  Was noted to be hypotensive at home by a home health, however blood pressures in the 90s here in the ED.  Labs today are reassuring with downtrending troponin, BNP significant change since yesterday, metabolic panel at baseline, CBC unremarkable.  His pressures gradually improved as high as 141/79 here in the ED.  Patient is much more alert, states he feels back to normal no longer drowsy.  Suspect this is due to not taking his evening dose of medications - presentation seems medication-induced.  Consulted with cardiologist, Dr. Marcelle Smiling for assistance with medication management.  He recommends discontinuing carvedilol, continue losartan, monitor weights and skip dose of Lasix as needed.  Patient has cardiology appointment on Tuesday for close follow-up.  Patient is not orthostatic, alert and mentating appropriately, agreeable with discharge.   Discussed results, findings, treatment and follow up. Patient advised of return precautions. Patient verbalized understanding and agreed with plan.  Final Clinical Impression(s) / ED Diagnoses Final diagnoses:  Hypotension due to drugs    Rx / DC Orders ED Discharge Orders    None       Windie Marasco, [place] N, PA-C 07/02/20 2120    Breck Coons, MD 07/02/20 2211

## 2020-07-02 NOTE — ED Triage Notes (Signed)
BIB [facility] coming from home. Per EMS Pt was suppose to be admitted with home health today. When home health arrived pt was pulling up to the house. Home health has concerns about pt being lethargic. When EMS arrived. Pt was lethargic and hypotensive on scene with SBP 70. Pt came up to 110 on the way here. Newly diagnosis with afib and CHF at [facility] last week. Trop was 80 at [employer]  In and out of Afib with EMS.

## 2020-07-03 LAB — CULTURE, BLOOD (ROUTINE X 2)
Culture: NO GROWTH
Culture: NO GROWTH
Special Requests: ADEQUATE
Special Requests: ADEQUATE

## 2020-07-05 ENCOUNTER — Telehealth: Payer: Self-pay

## 2020-07-05 NOTE — Progress Notes (Deleted)
   Patient ID: Jeffery Smith, male    DOB: 1924-12-16, 86 y.o.   MRN: [id number]  HPI  Jeffery Smith is a 86 y/o male with a history of  Echo report from 06/28/20 reviewed and showed an EF of 35-40% along with moderate LVH, mild Jeffery and moderate/severe aortic stenosis. Echo report from 07/16/2019 reviewed and showed an EF of 60-65% along with mild LVH, moderate Jeffery, mild TR and moderate/severe aortic stenosis.   Was in the ED 07/02/20 due to hypotension. Carvedilol stopped and he was released. Admitted 06/28/20 due to acute on chronic HF. Cardiology consult obtained. Initially given IV lasix with transition to oral diuretics. PT/OT evaluations done. VQ scan negative for PE. Given empiric antibiotics. Discharged after 3 days.   He presents today for his initial visit with a chief complaint of  Review of Systems    Physical Exam  Assessment & Plan:  1: Chronic heart failure with reduced ejection fraction- - NYHA class - BNP 07/02/20 was 752.9  2: CKD- - BMP 07/02/20 reviewed and showed sodium 137, potassium 4.4, creatinine 1.44 and GFR 42  3: Aortic stenosis- - saw cardiology Jeffery Smith) earlier today, 07/06/20

## 2020-07-05 NOTE — Telephone Encounter (Signed)
Post hospital discharge phone call to patient.  Jeffery Smith answered phone and asked that I speak with his wife, Jeffery Smith.  She reports that he had to return to the ED in [facility] for low blood pressure and lethargy on Friday.  His coreg was discontinued at that visit.  Losartan is 12.5 mg daily, lasix is every other day.  He has follow up with Dr. Park Breed tomorrow at 10 AM and then sees Inetta Fermo NP in the heart failure clinic at 1:30 PM.  She states Jeffery Smith has been sleeping well in bed, sleeps on one flat pillow, no PND or orthopnea. He ambulates around the house with his cane, no complaints of increasing SOB.  He continues to wear the abdominal binder and TED hose.  She denied any worsening edema.  Home health nurses are measuring his legs.  He is also working with PT and OT.  He did not start weighing daily until yesterday and that weight was 160#. Today 165#.  Blood pressure readings today 87/56 pulse 75, 88/45 pulse 73 and 98/60 pulse 46, he had been sleeping.  He denied dizziness or lightheadedness.  He has not used salt at the table.  Jeffery Endo RN, CHFN

## 2020-07-06 ENCOUNTER — Ambulatory Visit: Payer: No Typology Code available for payment source | Admitting: Family

## 2020-07-12 ENCOUNTER — Emergency Department (HOSPITAL_COMMUNITY): Payer: Medicare Other

## 2020-07-12 ENCOUNTER — Observation Stay (HOSPITAL_COMMUNITY)
Admission: EM | Admit: 2020-07-12 | Discharge: 2020-07-12 | Disposition: A | Discharge status: Home / Self Care | Payer: Medicare Other | Attending: Family Medicine | Admitting: Family Medicine

## 2020-07-12 ENCOUNTER — Other Ambulatory Visit: Payer: Self-pay

## 2020-07-12 ENCOUNTER — Encounter (HOSPITAL_COMMUNITY): Payer: Self-pay | Admitting: Emergency Medicine

## 2020-07-12 DIAGNOSIS — N183 Chronic kidney disease, stage 3 unspecified: Secondary | ICD-10-CM | POA: Diagnosis not present

## 2020-07-12 DIAGNOSIS — K219 Gastro-esophageal reflux disease without esophagitis: Secondary | ICD-10-CM | POA: Diagnosis present

## 2020-07-12 DIAGNOSIS — Z20822 Contact with and (suspected) exposure to covid-19: Secondary | ICD-10-CM | POA: Insufficient documentation

## 2020-07-12 DIAGNOSIS — E785 Hyperlipidemia, unspecified: Secondary | ICD-10-CM | POA: Insufficient documentation

## 2020-07-12 DIAGNOSIS — Z9889 Other specified postprocedural states: Secondary | ICD-10-CM

## 2020-07-12 DIAGNOSIS — N1831 Chronic kidney disease, stage 3a: Secondary | ICD-10-CM | POA: Diagnosis not present

## 2020-07-12 DIAGNOSIS — I509 Heart failure, unspecified: Secondary | ICD-10-CM

## 2020-07-12 DIAGNOSIS — I959 Hypotension, unspecified: Secondary | ICD-10-CM

## 2020-07-12 DIAGNOSIS — Z7982 Long term (current) use of aspirin: Secondary | ICD-10-CM | POA: Insufficient documentation

## 2020-07-12 DIAGNOSIS — R0902 Hypoxemia: Secondary | ICD-10-CM

## 2020-07-12 DIAGNOSIS — I5043 Acute on chronic combined systolic (congestive) and diastolic (congestive) heart failure: Secondary | ICD-10-CM

## 2020-07-12 DIAGNOSIS — D631 Anemia in chronic kidney disease: Secondary | ICD-10-CM | POA: Diagnosis not present

## 2020-07-12 DIAGNOSIS — I5033 Acute on chronic diastolic (congestive) heart failure: Principal | ICD-10-CM | POA: Insufficient documentation

## 2020-07-12 DIAGNOSIS — Z79899 Other long term (current) drug therapy: Secondary | ICD-10-CM | POA: Diagnosis not present

## 2020-07-12 DIAGNOSIS — Z96653 Presence of artificial knee joint, bilateral: Secondary | ICD-10-CM | POA: Diagnosis not present

## 2020-07-12 DIAGNOSIS — H9190 Unspecified hearing loss, unspecified ear: Secondary | ICD-10-CM

## 2020-07-12 DIAGNOSIS — I350 Nonrheumatic aortic (valve) stenosis: Secondary | ICD-10-CM

## 2020-07-12 DIAGNOSIS — R531 Weakness: Secondary | ICD-10-CM | POA: Diagnosis present

## 2020-07-12 DIAGNOSIS — N189 Chronic kidney disease, unspecified: Secondary | ICD-10-CM | POA: Diagnosis present

## 2020-07-12 DIAGNOSIS — J9601 Acute respiratory failure with hypoxia: Secondary | ICD-10-CM | POA: Diagnosis not present

## 2020-07-12 DIAGNOSIS — R7989 Other specified abnormal findings of blood chemistry: Secondary | ICD-10-CM | POA: Diagnosis present

## 2020-07-12 LAB — LIPID PANEL
Cholesterol: 175 mg/dL (ref 0–200)
HDL: 53 mg/dL (ref >40)
LDL Cholesterol: 109 mg/dL — ABNORMAL HIGH (ref 0–99)
Total CHOL/HDL Ratio: 3.3 RATIO
Triglycerides: 65 mg/dL (ref <150)
VLDL: 13 mg/dL (ref 0–40)

## 2020-07-12 LAB — TSH: TSH: 1.095 u[IU]/mL (ref 0.350–4.500)

## 2020-07-12 LAB — CBC WITH DIFFERENTIAL/PLATELET
Abs Immature Granulocytes: 0.05 10*3/uL (ref 0.00–0.07)
Basophils Absolute: 0.1 10*3/uL (ref 0.0–0.1)
Basophils Relative: 1 %
Eosinophils Absolute: 0.2 10*3/uL (ref 0.0–0.5)
Eosinophils Relative: 2 %
HCT: 38.9 % — ABNORMAL LOW (ref 39.0–52.0)
Hemoglobin: 12.7 g/dL — ABNORMAL LOW (ref 13.0–17.0)
Immature Granulocytes: 1 %
Lymphocytes Relative: 11 %
Lymphs Abs: 1 10*3/uL (ref 0.7–4.0)
MCH: 31.4 pg (ref 26.0–34.0)
MCHC: 32.6 g/dL (ref 30.0–36.0)
MCV: 96.3 fL (ref 80.0–100.0)
Monocytes Absolute: 1.2 10*3/uL — ABNORMAL HIGH (ref 0.1–1.0)
Monocytes Relative: 13 %
Neutro Abs: 7 10*3/uL (ref 1.7–7.7)
Neutrophils Relative %: 72 %
Platelets: 202 10*3/uL (ref 150–400)
RBC: 4.04 MIL/uL — ABNORMAL LOW (ref 4.22–5.81)
RDW: 13.2 % (ref 11.5–15.5)
WBC: 9.6 10*3/uL (ref 4.0–10.5)
nRBC: 0 % (ref 0.0–0.2)

## 2020-07-12 LAB — RESP PANEL BY RT PCR (RSV, FLU A&B, COVID)
Influenza A by PCR: NEGATIVE
Influenza B by PCR: NEGATIVE
Respiratory Syncytial Virus by PCR: NEGATIVE
SARS Coronavirus 2 by RT PCR: NEGATIVE

## 2020-07-12 LAB — COMPREHENSIVE METABOLIC PANEL
ALT: 12 U/L (ref 0–44)
AST: 13 U/L — ABNORMAL LOW (ref 15–41)
Albumin: 3.2 g/dL — ABNORMAL LOW (ref 3.5–5.0)
Alkaline Phosphatase: 51 U/L (ref 38–126)
Anion gap: 9 (ref 5–15)
BUN: 19 mg/dL (ref 8–23)
CO2: 25 mmol/L (ref 22–32)
Calcium: 9.2 mg/dL (ref 8.9–10.3)
Chloride: 105 mmol/L (ref 98–111)
Creatinine, Ser: 1.26 mg/dL — ABNORMAL HIGH (ref 0.61–1.24)
GFR calc Af Amer: 57 mL/min — ABNORMAL LOW (ref >60)
GFR calc non Af Amer: 49 mL/min — ABNORMAL LOW (ref >60)
Glucose, Bld: 109 mg/dL — ABNORMAL HIGH (ref 70–99)
Potassium: 4.2 mmol/L (ref 3.5–5.1)
Sodium: 139 mmol/L (ref 135–145)
Total Bilirubin: 0.9 mg/dL (ref 0.3–1.2)
Total Protein: 6 g/dL — ABNORMAL LOW (ref 6.5–8.1)

## 2020-07-12 LAB — HEMOGLOBIN A1C
Hgb A1c MFr Bld: 5.8 % — ABNORMAL HIGH (ref 4.8–5.6)
Mean Plasma Glucose: 119.76 mg/dL

## 2020-07-12 LAB — VITAMIN D 25 HYDROXY (VIT D DEFICIENCY, FRACTURES): Vit D, 25-Hydroxy: 54.18 ng/mL (ref 30–100)

## 2020-07-12 LAB — TROPONIN I (HIGH SENSITIVITY): Troponin I (High Sensitivity): 55 ng/L — ABNORMAL HIGH (ref <18)

## 2020-07-12 LAB — BRAIN NATRIURETIC PEPTIDE: B Natriuretic Peptide: 1362 pg/mL — ABNORMAL HIGH (ref 0.0–100.0)

## 2020-07-12 MED ORDER — ONDANSETRON HCL 4 MG/2ML IJ SOLN: 4.0000 mg | Freq: Four times a day (QID) | INTRAMUSCULAR | Status: DC | PRN

## 2020-07-12 MED ORDER — ENOXAPARIN SODIUM 40 MG/0.4ML ~~LOC~~ SOLN
40.0000 mg | SUBCUTANEOUS | Status: DC
  Administered 2020-07-12: 40 mg via SUBCUTANEOUS
  Filled 2020-07-12: qty 0.4

## 2020-07-12 MED ORDER — METOPROLOL SUCCINATE ER 25 MG PO TB24: 12.5000 mg | ORAL_TABLET | Freq: Every day | ORAL | Status: DC

## 2020-07-12 MED ORDER — FUROSEMIDE 20 MG PO TABS: 20.0000 mg | ORAL_TABLET | ORAL | 2 refills | Status: AC

## 2020-07-12 MED ORDER — SODIUM CHLORIDE 0.9% FLUSH: 3.0000 mL | INTRAVENOUS | Status: DC | PRN

## 2020-07-12 MED ORDER — SODIUM CHLORIDE 0.9% FLUSH: 3.0000 mL | Freq: Two times a day (BID) | INTRAVENOUS | Status: DC

## 2020-07-12 MED ORDER — ASPIRIN EC 81 MG PO TBEC
81.0000 mg | DELAYED_RELEASE_TABLET | Freq: Every day | ORAL | Status: DC
  Administered 2020-07-12: 81 mg via ORAL
  Filled 2020-07-12: qty 1

## 2020-07-12 MED ORDER — SODIUM CHLORIDE 0.9 % IV SOLN: 250.0000 mL | INTRAVENOUS | Status: DC | PRN

## 2020-07-12 MED ORDER — TRAZODONE HCL 50 MG PO TABS: 25.0000 mg | ORAL_TABLET | Freq: Every evening | ORAL | Status: DC | PRN

## 2020-07-12 MED ORDER — CARVEDILOL 3.125 MG PO TABS: 3.1250 mg | ORAL_TABLET | Freq: Two times a day (BID) | ORAL | Status: DC

## 2020-07-12 MED ORDER — ACETAMINOPHEN 325 MG PO TABS: 650.0000 mg | ORAL_TABLET | ORAL | Status: DC | PRN

## 2020-07-12 MED ORDER — PANTOPRAZOLE SODIUM 40 MG PO TBEC: 40.0000 mg | DELAYED_RELEASE_TABLET | Freq: Every day | ORAL | Status: DC

## 2020-07-12 MED ORDER — FUROSEMIDE 10 MG/ML IJ SOLN
60.0000 mg | Freq: Once | INTRAMUSCULAR | Status: AC
  Administered 2020-07-12: 60 mg via INTRAVENOUS
  Filled 2020-07-12: qty 6

## 2020-07-12 NOTE — Care Management CC44 (Signed)
Condition Code 44 Documentation Completed  Patient Details  Name: MONTY SPICHER MRN: [id number] Date of Birth: [birth year]   Condition Code 44 given:  Yes Patient signature on Condition Code 44 notice:  Yes Documentation of 2 MD's agreement:  Yes Code 44 added to claim:  Yes    Leitha Bleak, RN 07/12/2020, 12:59 PM

## 2020-07-12 NOTE — Care Management Obs Status (Signed)
MEDICARE OBSERVATION STATUS NOTIFICATION   Patient Details  Name: Jeffery Smith MRN: [id number] Date of Birth: 1924/12/28   Medicare Observation Status Notification Given:  Yes    Leitha Bleak, RN 07/12/2020, 12:59 PM

## 2020-07-12 NOTE — Discharge Instructions (Signed)
Heart Failure, Diagnosis   Heart failure means that your heart is not able to pump blood in the right way. This makes it hard for your body to work well. Heart failure is usually a long-term (chronic) condition. You must take good care of yourself and follow your treatment plan from your doctor. What are the causes? This condition may be caused by:  High blood pressure.  Build up of cholesterol and fat in the arteries.  Heart attack. This injures the heart muscle.  Heart valves that do not open and close properly.  Damage of the heart muscle. This is also called cardiomyopathy.  Lung disease.  Abnormal heart rhythms. What increases the risk? The risk of heart failure goes up as a person ages. This condition is also more likely to develop in people who:  Are overweight.  Are male.  Smoke or chew tobacco.  Abuse alcohol or illegal drugs.  Have taken medicines that can damage the heart.  Have diabetes.  Have abnormal heart rhythms.  Have thyroid problems.  Have low blood counts (anemia). What are the signs or symptoms? Symptoms of this condition include:  Shortness of breath.  Coughing.  Swelling of the feet, ankles, legs, or belly.  Losing weight for no reason.  Trouble breathing.  Waking from sleep because of the need to sit up and get more air.  Rapid heartbeat.  Being very tired.  Feeling dizzy, or feeling like you may pass out (faint).  Having no desire to eat.  Feeling like you may vomit (nauseous).  Peeing (urinating) more at night.  Feeling confused. How is this treated?     This condition may be treated with:  Medicines. These can be given to treat blood pressure and to make the heart muscles stronger.  Changes in your daily life. These may include eating a healthy diet, staying at a healthy body weight, quitting tobacco and illegal drug use, or doing exercises.  Surgery. Surgery can be done to open blocked valves, or to put devices  in the heart, such as pacemakers.  A donor heart (heart transplant). You will receive a healthy heart from a donor. Follow these instructions at home:  Treat other conditions as told by your doctor. These may include high blood pressure, diabetes, thyroid disease, or abnormal heart rhythms.  Learn as much as you can about heart failure.  Get support as you need it.  Keep all follow-up visits as told by your doctor. This is important. Summary  Heart failure means that your heart is not able to pump blood in the right way.  This condition is caused by high blood pressure, heart attack, or damage of the heart muscle.  Symptoms of this condition include shortness of breath and swelling of the feet, ankles, legs, or belly. You may also feel very tired or feel like you may vomit.  You may be treated with medicines, surgery, or changes in your daily life.  Treat other health conditions as told by your doctor. This information is not intended to replace advice given to you by your health care provider. Make sure you discuss any questions you have with your health care provider. Document Revised: 12/13/2018 Document Reviewed: 12/13/2018 Elsevier Patient Education  2020 [employer]   Heart Failure, Self Care Heart failure is a serious condition. This sheet explains things you need to do to take care of yourself at home. To help you stay as healthy as possible, you may be asked to change your diet,  take certain medicines, and make other changes in your life. Your doctor may also give you more specific instructions. If you have problems or questions, call your doctor. What are the risks? Having heart failure makes it more likely for you to have some problems. These problems can get worse if you do not take good care of yourself. Problems may include:  Blood clotting problems. This may cause a stroke.  Damage to the kidneys, liver, or lungs.  Abnormal heart rhythms. Supplies  needed:  Scale for weighing yourself.  Blood pressure monitor.  Notebook.  Medicines. How to care for yourself when you have heart failure Medicines Take over-the-counter and prescription medicines only as told by your doctor. Take your medicines every day.  Do not stop taking your medicine unless your doctor tells you to do so.  Do not skip any medicines.  Get your prescriptions refilled before you run out of medicine. This is important. Eating and drinking   Eat heart-healthy foods. Talk with a diet specialist (dietitian) to create an eating plan.  Choose foods that: ? Have no trans fat. ? Are low in saturated fat and cholesterol.  Choose healthy foods, such as: ? Fresh or frozen fruits and vegetables. ? Fish. ? Low-fat (lean) meats. ? Legumes, such as beans, peas, and lentils. ? Fat-free or low-fat dairy products. ? Whole-grain foods. ? High-fiber foods.  Limit salt (sodium) if told by your doctor. Ask your diet specialist to tell you which seasonings are healthy for your heart.  Cook in healthy ways instead of frying. Healthy ways of cooking include roasting, grilling, broiling, baking, poaching, steaming, and stir-frying.  Limit how much fluid you drink, if told by your doctor. Alcohol use  Do not drink alcohol if: ? Your doctor tells you not to drink. ? Your heart was damaged by alcohol, or you have very bad heart failure. ? You are pregnant, may be pregnant, or are planning to become pregnant.  If you drink alcohol: ? Limit how much you use to:  0-1 drink a day for women.  0-2 drinks a day for men. ? Be aware of how much alcohol is in your drink. In the [place] one drink equals one 12 oz bottle of beer (355 mL), one 5 oz glass of wine (148 mL), or one 1 oz glass of hard liquor (44 mL). Lifestyle   Do not use any products that contain nicotine or tobacco, such as cigarettes, e-cigarettes, and chewing tobacco. If you need help quitting, ask your  doctor. ? Do not use nicotine gum or patches before talking to your doctor.  Do not use illegal drugs.  Lose weight if told by your doctor.  Do physical activity if told by your doctor. Talk to your doctor before you begin an exercise if: ? You are an older adult. ? You have very bad heart failure.  Learn to manage stress. If you need help, ask your doctor.  Get rehab (rehabilitation) to help you stay independent and to help with your quality of life.  Plan time to rest when you get tired. Check weight and blood pressure   Weigh yourself every day. This will help you to know if fluid is building up in your body. ? Weigh yourself every morning after you pee (urinate) and before you eat breakfast. ? Wear the same amount of clothing each time. ? Write down your daily weight. Give your record to your doctor.  Check and write down your blood pressure as told  by your doctor.  Check your pulse as told by your doctor. Dealing with very hot and very cold weather  If it is very hot: ? Avoid activities that take a lot of energy. ? Use air conditioning or fans, or find a cooler place. ? Avoid caffeine and alcohol. ? Wear clothing that is loose-fitting, lightweight, and light-colored.  If it is very cold: ? Avoid activities that take a lot of energy. ? Layer your clothes. ? Wear mittens or gloves, a hat, and a scarf when you go outside. ? Avoid alcohol. Follow these instructions at home:  Stay up to date with shots (vaccines). Get pneumococcal and flu (influenza) shots.  Keep all follow-up visits as told by your doctor. This is important. Contact a doctor if:  You gain weight quickly.  You have increasing shortness of breath.  You cannot do your normal activities.  You get tired easily.  You cough a lot.  You don't feel like eating or feel like you may vomit (nauseous).  You become puffy (swell) in your hands, feet, ankles, or belly (abdomen).  You cannot sleep well  because it is hard to breathe.  You feel like your heart is beating fast (palpitations).  You get dizzy when you stand up. Get help right away if:  You have trouble breathing.  You or someone else notices a change in your behavior, such as having trouble staying awake.  You have chest pain or discomfort.  You pass out (faint). These symptoms may be an emergency. Do not wait to see if the symptoms will go away. Get medical help right away. Call your local emergency services (911 in the [place] Do not drive yourself to the hospital. Summary  Heart failure is a serious condition. To care for yourself, you may have to change your diet, take medicines, and make other lifestyle changes.  Take your medicines every day. Do not stop taking them unless your doctor tells you to do so.  Eat heart-healthy foods, such as fresh or frozen fruits and vegetables, fish, lean meats, legumes, fat-free or low-fat dairy products, and whole-grain or high-fiber foods.  Ask your doctor if you can drink alcohol. You may have to stop alcohol use if you have very bad heart failure.  Contact your doctor if you gain weight quickly or feel that your heart is beating too fast. Get help right away if you pass out, or have chest pain or trouble breathing. This information is not intended to replace advice given to you by your health care provider. Make sure you discuss any questions you have with your health care provider. Document Revised: 01/07/2019 Document Reviewed: 01/08/2019 Elsevier Patient Education  2020 Else[employer] Inc.   Heart Failure Action Plan A heart failure action plan helps you understand what to do when you have symptoms of heart failure. Follow the plan that was created by you and your health care provider. Review your plan each time you visit your health care provider. Red zone These signs and symptoms mean you should get medical help right away:  You have trouble breathing when resting.  You  have a dry cough that is getting worse.  You have swelling or pain in your legs or abdomen that is getting worse.  You suddenly gain more than 2-3 lb (0.9-1.4 kg) in a day, or more than 5 lb (2.3 kg) in one week. This amount may be more or less depending on your condition.  You have trouble staying awake or you  feel confused.  You have chest pain.  You do not have an appetite.  You pass out. If you experience any of these symptoms:  Call your local emergency services (911 in the [place] right away or seek help at the emergency department of the nearest hospital. Yellow zone These signs and symptoms mean your condition may be getting worse and you should make some changes:  You have trouble breathing when you are active or you need to sleep with extra pillows.  You have swelling in your legs or abdomen.  You gain 2-3 lb (0.9-1.4 kg) in one day, or 5 lb (2.3 kg) in one week. This amount may be more or less depending on your condition.  You get tired easily.  You have trouble sleeping.  You have a dry cough. If you experience any of these symptoms:  Contact your health care provider within the next day.  Your health care provider may adjust your medicines. Green zone These signs mean you are doing well and can continue what you are doing:  You do not have shortness of breath.  You have very little swelling or no new swelling.  Your weight is stable (no gain or loss).  You have a normal activity level.  You do not have chest pain or any other new symptoms. Follow these instructions at home:  Take over-the-counter and prescription medicines only as told by your health care provider.  Weigh yourself daily. Your target weight is __________ lb (__________ kg). ? Call your health care provider if you gain more than __________ lb (__________ kg) in a day, or more than __________ lb (__________ kg) in one week.  Eat a heart-healthy diet. Work with a diet and nutrition  specialist (dietitian) to create an eating plan that is best for you.  Keep all follow-up visits as told by your health care provider. This is important. Where to find more information  American Heart Association: www.heart.org Summary  Follow the action plan that was created by you and your health care provider.  Get help right away if you have any symptoms in the Red zone. This information is not intended to replace advice given to you by your health care provider. Make sure you discuss any questions you have with your health care provider. Document Revised: 09/07/2017 Document Reviewed: 11/04/2016 Elsevier Patient Education  2020 [employer] Inc.   Heart Failure Eating Plan Heart failure, also called congestive heart failure, occurs when your heart does not pump blood well enough to meet your body's needs for oxygen-rich blood. Heart failure is a long-term (chronic) condition. Living with heart failure can be challenging. However, following your health care provider's instructions about a healthy lifestyle and working with a diet and nutrition specialist (dietitian) to choose the right foods may help to improve your symptoms. What are tips for following this plan? Reading food labels  Check food labels for the amount of sodium per serving. Choose foods that have less than 140 mg (milligrams) of sodium in each serving.  Check food labels for the number of calories per serving. This is important if you need to limit your daily calorie intake to lose weight.  Check food labels for the serving size. If you eat more than one serving, you will be eating more sodium and calories than what is listed on the label.  Look for foods that are labeled as "sodium-free," "very low sodium," or "low sodium." ? Foods labeled as "reduced sodium" or "lightly salted" may still have  more sodium than what is recommended for you. Cooking  Avoid adding salt when cooking. Ask your health care provider or  dietitian before using salt substitutes.  Season food with salt-free seasonings, spices, or herbs. Check the label of seasoning mixes to make sure they do not contain salt.  Cook with heart-healthy oils, such as olive, canola, soybean, or sunflower oil.  Do not fry foods. Cook foods using low-fat methods, such as baking, boiling, grilling, and broiling.  Limit unhealthy fats when cooking by: ? Removing the skin from poultry, such as chicken. ? Removing all visible fats from meats. ? Skimming the fat off from stews, soups, and gravies before serving them. Meal planning   Limit your intake of: ? Processed, canned, or pre-packaged foods. ? Foods that are high in trans fat, such as fried foods. ? Sweets, desserts, sugary drinks, and other foods with added sugar. ? Full-fat dairy products, such as whole milk.  Eat a balanced diet that includes: ? 4-5 servings of fruit each day and 4-5 servings of vegetables each day. At each meal, try to fill half of your plate with fruits and vegetables. ? Up to 6-8 servings of whole grains each day. ? Up to 2 servings of lean meat, poultry, or fish each day. One serving of meat is equal to 3 oz. This is about the same size as a deck of cards. ? 2 servings of low-fat dairy each day. ? Heart-healthy fats. Healthy fats called omega-3 fatty acids are found in foods such as flaxseed and cold-water fish like sardines, salmon, and mackerel.  Aim to eat 25-35 g (grams) of fiber a day. Foods that are high in fiber include apples, broccoli, carrots, beans, peas, and whole grains.  Do not add salt or condiments that contain salt (such as soy sauce) to foods before eating.  When eating at a restaurant, ask that your food be prepared with less salt or no salt, if possible.  Try to eat 2 or more vegetarian meals each week.  Eat more home-cooked food and eat less restaurant, buffet, and fast food. General information  Do not eat more than 2,300 mg of salt  (sodium) a day. The amount of sodium that is recommended for you may be lower, depending on your condition.  Maintain a healthy body weight as directed. Ask your health care provider what a healthy weight is for you. ? Check your weight every day. ? Work with your health care provider and dietitian to make a plan that is right for you to lose weight or maintain your current weight.  Limit how much fluid you drink. Ask your health care provider or dietitian how much fluid you can have each day.  Limit or avoid alcohol as told by your health care provider or dietitian. Recommended foods The items listed may not be a complete list. Talk with your dietitian about what dietary choices are best for you. Fruits All fresh, frozen, and canned fruits. Dried fruits, such as raisins, prunes, and cranberries. Vegetables All fresh vegetables. Vegetables that are frozen without sauce or added salt. Low-sodium or sodium-free canned vegetables. Grains Bread with less than 80 mg of sodium per slice. Whole-wheat pasta, quinoa, and brown rice. Oats and oatmeal. Barley. Millet. Grits and cream of wheat. Whole-grain and whole-wheat cold cereal. Meats and other protein foods Lean cuts of meat. Skinless chicken and [place]. Fish with high omega-3 fatty acids, such as salmon, sardines, and other cold-water fishes. Eggs. Dried beans, peas, and edamame.  Unsalted nuts and nut butters. Dairy Low-fat or nonfat (skim) milk and dried milk. Rice milk, soy milk, and almond milk. Low-fat or nonfat yogurt. Small amounts of reduced-sodium block cheese. Low-sodium cottage cheese. Fats and oils Olive, canola, soybean, flaxseed, or sunflower oil. Avocado. Sweets and desserts Apple sauce. Granola bars. Sugar-free pudding and gelatin. Frozen fruit bars. Seasoning and other foods Fresh and dried herbs. Lemon or lime juice. Vinegar. Low-sodium ketchup. Salt-free marinades, salad dressings, sauces, and seasonings. The items listed  above may not be a complete list of foods and beverages you can eat. Contact a dietitian for more information. Foods to avoid The items listed may not be a complete list. Talk with your dietitian about what dietary choices are best for you. Fruits Fruits that are dried with sodium-containing preservatives. Vegetables Canned vegetables. Frozen vegetables with sauce or seasonings. Creamed vegetables. [place] fries. Onion rings. Pickled vegetables and sauerkraut. Grains Bread with more than 80 mg of sodium per slice. Hot or cold cereal with more than 140 mg sodium per serving. Salted pretzels and crackers. Pre-packaged breadcrumbs. Bagels, croissants, and biscuits. Meats and other protein foods Ribs and chicken wings. Bacon, ham, pepperoni, bologna, salami, and packaged luncheon meats. Hot dogs, bratwurst, and sausage. Canned meat. Smoked meat and fish. Salted nuts and seeds. Dairy Whole milk, half-and-half, and cream. Buttermilk. Processed cheese, cheese spreads, and cheese curds. Regular cottage cheese. Feta cheese. Shredded cheese. String cheese. Fats and oils Butter, lard, shortening, ghee, and bacon fat. Canned and packaged gravies. Seasoning and other foods Onion salt, garlic salt, table salt, and sea salt. Marinades. Regular salad dressings. Relishes, pickles, and olives. Meat flavorings and tenderizers, and bouillon cubes. Horseradish, ketchup, and mustard. Worcestershire sauce. Teriyaki sauce, soy sauce (including reduced sodium). Hot sauce and Tabasco sauce. Steak sauce, fish sauce, oyster sauce, and cocktail sauce. Taco seasonings. Barbecue sauce. Tartar sauce. The items listed above may not be a complete list of foods and beverages you should avoid. Contact a dietitian for more information. Summary  A heart failure eating plan includes changes that limit your intake of sodium and unhealthy fat, and it may help you lose weight or maintain a healthy weight. Your health care provider may  also recommend limiting how much fluid you drink.  Most people with heart failure should eat no more than 2,300 mg of salt (sodium) a day. The amount of sodium that is recommended for you may be lower, depending on your condition.  Contact your health care provider or dietitian before making any major changes to your diet. This information is not intended to replace advice given to you by your health care provider. Make sure you discuss any questions you have with your health care provider. Document Revised: 11/21/2018 Document Reviewed: 02/09/2017 Elsevier Patient Education  2020 Else[employer] Inc.   IMPORTANT INFORMATION: PAY CLOSE ATTENTION   PHYSICIAN DISCHARGE INSTRUCTIONS  Follow with Primary care provider  System, Provider Not In  and other consultants as instructed by your Hospitalist Physician  SEEK MEDICAL CARE OR RETURN TO EMERGENCY ROOM IF SYMPTOMS COME BACK, WORSEN OR NEW PROBLEM DEVELOPS   Please note: You were cared for by a hospitalist during your hospital stay. Every effort will be made to forward records to your primary care provider.  You can request that your primary care provider send for your hospital records if they have not received them.  Once you are discharged, your primary care physician will handle any further medical issues. Please note that NO REFILLS for any  discharge medications will be authorized once you are discharged, as it is imperative that you return to your primary care physician (or establish a relationship with a primary care physician if you do not have one) for your post hospital discharge needs so that they can reassess your need for medications and monitor your lab values.  Please get a complete blood count and chemistry panel checked by your Primary MD at your next visit, and again as instructed by your Primary MD.  Get Medicines reviewed and adjusted: Please take all your medications with you for your next visit with your Primary  MD  Laboratory/radiological data: Please request your Primary MD to go over all hospital tests and procedure/radiological results at the follow up, please ask your primary care provider to get all Hospital records sent to his/her office.  In some cases, they will be blood work, cultures and biopsy results pending at the time of your discharge. Please request that your primary care provider follow up on these results.  If you are diabetic, please bring your blood sugar readings with you to your follow up appointment with primary care.    Please call and make your follow up appointments as soon as possible.    Also Note the following: If you experience worsening of your admission symptoms, develop shortness of breath, life threatening emergency, suicidal or homicidal thoughts you must seek medical attention immediately by calling 911 or calling your MD immediately  if symptoms less severe.  You must read complete instructions/literature along with all the possible adverse reactions/side effects for all the Medicines you take and that have been prescribed to you. Take any new Medicines after you have completely understood and accpet all the possible adverse reactions/side effects.   Do not drive when taking Pain medications or sleeping medications (Benzodiazepines)  Do not take more than prescribed Pain, Sleep and Anxiety Medications. It is not advisable to combine anxiety,sleep and pain medications without talking with your primary care practitioner  Special Instructions: If you have smoked or chewed Tobacco  in the last 2 yrs please stop smoking, stop any regular Alcohol  and or any Recreational drug use.  Wear Seat belts while driving.  Do not drive if taking any narcotic, mind altering or controlled substances or recreational drugs or alcohol.

## 2020-07-12 NOTE — ED Provider Notes (Signed)
[facility] EMERGENCY DEPARTMENT Provider Note   CSN: [id number] Arrival date & time: 07/12/20  0344   Time seen 5:40 AM  History Chief Complaint  Patient presents with  . Weakness    CHF   Level 5 caveat for age  Jeffery Smith is a 86 y.o. male.  HPI Wife states that patient has had a cough for couple days.  She states he was seen a week ago at [facility] when he was wheezing and rattling and was diagnosed with congestive heart failure.  She states he had problems of his blood pressure getting too low and they had to stop 2 of his medications.  They stopped his Carvediol on September 24 and they stopped his losartan yesterday.  Wife reports the past 2 nights he has had to sit up in a chair at night to sleep.  She states he was not having pain and he was not short of breath but he was making so much rattling and noise that would keep him awake.  She states he did appear short of breath at church yesterday, October 3 while he had to sitting with a mask in place.  She states he was spitting up some pinkish tinged fluid tonight.  He has had some swelling of his legs but that has improved over the past week.  Patient denies chest pain or shortness of breath to me.  He states he is "short of food" and pats his stomach.  Wife also states he had pneumonia and he has finished those antibiotics.  PCP System, Provider Not In Dr. Lynelle Doctor    Past Medical History:  Diagnosis Date  . Arthritis   . Benign head tremor   . Carotid artery occlusion    right side   . Dysrhythmia    irregular heart beat per patient- skips a beat   . Enlarged prostate   . GERD (gastroesophageal reflux disease)   . Hard of hearing   . Hyperlipidemia   . Sore throat     Patient Active Problem List   Diagnosis Date Noted  . Acute CHF (congestive heart failure) (HCC) 06/28/2020  . Acute respiratory failure with hypoxia (HCC) 06/28/2020  . Acute on chronic diastolic CHF (congestive heart failure) (HCC)  06/28/2020  . Moderate to severe aortic stenosis 06/28/2020  . H/O carotid endarterectomy 06/28/2020  . Elevated troponin 06/28/2020  . Chronic kidney disease, stage 3a (HCC) 06/28/2020  . OA (osteoarthritis) of knee 09/21/2014  . Occlusion and stenosis of carotid artery without mention of cerebral infarction 11/21/2011    Past Surgical History:  Procedure Laterality Date  . CATARACT EXTRACTION     left  . ENDARTERECTOMY  11/03/2011   Procedure: ENDARTERECTOMY CAROTID;  Surgeon: Larina Earthly, MD;  Location: [facility] OR;  Service: Vascular;  Laterality: Right;  Right Carotid endarterectomy with patch angiopolasty  . TONSILLECTOMY    . TOTAL KNEE ARTHROPLASTY     left  . TOTAL KNEE ARTHROPLASTY Right 09/21/2014   Procedure: RIGHT TOTAL KNEE ARTHROPLASTY;  Surgeon: Loanne Drilling, MD;  Location: [facility] ORS;  Service: Orthopedics;  Laterality: Right;       Family History  Problem Relation Age of Onset  . Cancer Mother   . Cancer Brother     Social History   Tobacco Use  . Smoking status: Never Smoker  . Smokeless tobacco: Never Used  Vaping Use  . Vaping Use: Never used  Substance Use Topics  . Alcohol use: Yes  Comment: 1 glass of wine daily  . Drug use: No    Home Medications Prior to Admission medications   Medication Sig Start Date End Date Taking? Authorizing Provider  acetaminophen (TYLENOL) 325 MG tablet Take 650 mg by mouth every 6 (six) hours as needed for mild pain, fever or headache.    [provider]  Apoaequorin (PREVAGEN EXTRA STRENGTH PO) Take 1 tablet by mouth See admin instructions. Chew 1 tablet by mouth every evening    [provider]  aspirin EC 81 MG tablet Take 1 tablet (81 mg total) by mouth daily. 10/07/19   [place] Ouch, Lennart Pall, PA-C  carvedilol (COREG) 3.125 MG tablet Take 1 tablet (3.125 mg total) by mouth 2 (two) times daily with a meal. 07/01/20   Sheikh, Kateri Mc Latif, DO  cholecalciferol (VITAMIN D3) 25 MCG (1000 UNIT) tablet Take  1,000 Units by mouth daily.    [provider]  diphenhydrAMINE (BENADRYL) 25 MG tablet Take 25 mg by mouth daily. Patient not taking: Reported on 07/02/2020    [provider]  fluticasone (FLONASE) 50 MCG/ACT nasal spray Place 2 sprays into both nostrils daily.    [provider]  furosemide (LASIX) 20 MG tablet Take 1 tablet (20 mg total) by mouth every other day. 07/01/20 09/29/20  Marguerita Merles Latif, DO  ipratropium (ATROVENT) 0.06 % nasal spray Place 2 sprays into both nostrils at bedtime.    [provider]  loratadine (CLARITIN) 10 MG tablet Take 10 mg by mouth daily.    [provider]  losartan (COZAAR) 25 MG tablet Take 0.5 tablets (12.5 mg total) by mouth daily. 07/02/20   Marguerita Merles Latif, DO  omeprazole (PRILOSEC) 20 MG capsule Take 20 mg by mouth at bedtime.     [provider]    Allergies    Horse-derived products  Review of Systems   Review of Systems  Unable to perform ROS: Age    Physical Exam Updated Vital Signs BP 104/70   Pulse 95   Temp 98.7 F (37.1 C) (Oral)   Resp (!) 27   Ht 6\' 1"  (1.854 m)   Wt 77.1 kg   SpO2 98%   BMI 22.43 kg/m   Physical Exam Vitals and nursing note reviewed.  Constitutional:      Comments: Frail elderly male who is extremely hard of hearing  HENT:     Head: Normocephalic and atraumatic.     Right Ear: External ear normal.     Left Ear: External ear normal.  Eyes:     Extraocular Movements: Extraocular movements intact.     Conjunctiva/sclera: Conjunctivae normal.  Cardiovascular:     Rate and Rhythm: Normal rate.     Pulses: Normal pulses.  Pulmonary:     Effort: No tachypnea, accessory muscle usage or prolonged expiration.     Breath sounds: Decreased air movement present. Examination of the right-middle field reveals rales. Examination of the left-middle field reveals rales. Examination of the right-lower field reveals rales. Examination of the left-lower field  reveals rales. Rales present. No wheezing or rhonchi.  Musculoskeletal:     Cervical back: Normal range of motion.     Right lower leg: No edema.     Left lower leg: No edema.  Skin:    General: Skin is warm and dry.  Neurological:     General: No focal deficit present.     Mental Status: He is alert and oriented to person, place, and time.  Cranial Nerves: No cranial nerve deficit.  Psychiatric:        Mood and Affect: Mood normal.        Behavior: Behavior normal.        Thought Content: Thought content normal.     ED Results / Procedures / Treatments   Labs (all labs ordered are listed, but only abnormal results are displayed) Results for orders placed or performed during the hospital encounter of 07/12/20  Comprehensive metabolic panel  Result Value Ref Range   Sodium 139 135 - 145 mmol/L   Potassium 4.2 3.5 - 5.1 mmol/L   Chloride 105 98 - 111 mmol/L   CO2 25 22 - 32 mmol/L   Glucose, Bld 109 (H) 70 - 99 mg/dL   BUN 19 8 - 23 mg/dL   Creatinine, Ser 6.31 (H) 0.61 - 1.24 mg/dL   Calcium 9.2 8.9 - 49.7 mg/dL   Total Protein 6.0 (L) 6.5 - 8.1 g/dL   Albumin 3.2 (L) 3.5 - 5.0 g/dL   AST 13 (L) 15 - 41 U/L   ALT 12 0 - 44 U/L   Alkaline Phosphatase 51 38 - 126 U/L   Total Bilirubin 0.9 0.3 - 1.2 mg/dL   GFR calc non Af Amer 49 (L) >60 mL/min   GFR calc Af Amer 57 (L) >60 mL/min   Anion gap 9 5 - 15  Brain natriuretic peptide  Result Value Ref Range   B Natriuretic Peptide 1,362.0 (H) 0.0 - 100.0 pg/mL  CBC with Differential  Result Value Ref Range   WBC 9.6 4.0 - 10.5 K/uL   RBC 4.04 (L) 4.22 - 5.81 MIL/uL   Hemoglobin 12.7 (L) 13.0 - 17.0 g/dL   HCT 02.6 (L) 39 - 52 %   MCV 96.3 80.0 - 100.0 fL   MCH 31.4 26.0 - 34.0 pg   MCHC 32.6 30.0 - 36.0 g/dL   RDW 37.8 58.8 - 50.2 %   Platelets 202 150 - 400 K/uL   nRBC 0.0 0.0 - 0.2 %   Neutrophils Relative % 72 %   Neutro Abs 7.0 1.7 - 7.7 K/uL   Lymphocytes Relative 11 %   Lymphs Abs 1.0 0.7 - 4.0 K/uL    Monocytes Relative 13 %   Monocytes Absolute 1.2 (H) 0 - 1 K/uL   Eosinophils Relative 2 %   Eosinophils Absolute 0.2 0 - 0 K/uL   Basophils Relative 1 %   Basophils Absolute 0.1 0 - 0 K/uL   Immature Granulocytes 1 %   Abs Immature Granulocytes 0.05 0.00 - 0.07 K/uL  Troponin I (High Sensitivity)  Result Value Ref Range   Troponin I (High Sensitivity) 55 (H) <18 ng/L   Laboratory interpretation all normal except renal insufficiency, anemia, elevation of BNP, elevation of initial troponin, malnutrition   EKG EKG Interpretation  Date/Time:  Monday July 12 2020 03:58:13 EDT Ventricular Rate:  100 PR Interval:    QRS Duration: 139 QT Interval:  394 QTC Calculation: 509 R Axis:   -66 Text Interpretation: Sinus tachycardia Multiple premature complexes, vent & supraven Right bundle branch block LVH with IVCD and secondary repol abnrm Prolonged QT interval Since last tracing rate faster 02 Jul 2020 Confirmed by Devoria Albe ([id number]) on 07/12/2020 4:12:48 AM   Radiology DG Chest Port 1 View  Result Date: 07/12/2020 CLINICAL DATA:  Shortness of breath.  PND. EXAM: PORTABLE CHEST 1 VIEW COMPARISON:  07/02/2020 FINDINGS: Diffuse interstitial opacity with cephalized blood flow and asymmetric right  upper lobe density. Chronic cardiomegaly. Small pleural effusions are likely. No pneumothorax. IMPRESSION: CHF pattern. Electronically Signed   By: Marnee Spring M.D.   On: 07/12/2020 05:13   DG Chest 1 View  Result Date: 07/01/2020 CLINICAL DATA:  Shortness of breathIMPRESSION: Stable patchy bilateral airspace opacity. Electronically Signed   By: Alcide Clever M.D.   On: 07/01/2020 03:51    NM Pulmonary Perfusion  Result Date: 06/28/2020 CLINICAL DATA:  Positive D-dimer, shortness of breath . IMPRESSION: 1. No evidence of pulmonary embolus based on PISAPED criteria. Electronically Signed   By: Sharlet Salina M.D.   On: 06/28/2020 15:11     ECHOCARDIOGRAM COMPLETE BUBBLE STUDY  Result  Date: 06/28/2020    ECHOCARDIOGRAM REPORT   Patient Name:   KHOI HAMBERGER Date of Exam: 06/28/2020 . IMPRESSIONS  1. Left ventricular ejection fraction, by estimation, is 35 to 40%. The left ventricle has moderately decreased function. The left ventricle demonstrates global hypokinesis. There is moderate concentric left ventricular hypertrophy. Left ventricular diastolic parameters are consistent with Grade I diastolic dysfunction (impaired relaxation).  2. Right ventricular systolic function is low normal. The right ventricular size is mildly enlarged.  3. Left atrial size was moderately dilated.  4. Right atrial size was moderately dilated.  5. The mitral valve is degenerative. Mild mitral valve regurgitation. No evidence of mitral stenosis. Moderate to severe mitral annular calcification.  6. The aortic valve is calcified. Aortic valve regurgitation is mild to moderate. Moderate to severe aortic valve stenosis.  7. The inferior vena cava is normal in size with greater than 50% respiratory variability, suggesting right atrial pressure of 3 mmHg.  8. Agitated saline contrast bubble study was negative, with no evidence of any interatrial shunt.  Adrian Blackwater MD Electronically signed by Adrian Blackwater MD Signature Date/Time: 06/28/2020/5:03:55 PM    Final      Procedures .Critical Care Performed by: Devoria Albe, MD Authorized by: Devoria Albe, MD   Critical care provider statement:    Critical care time (minutes):  32   Critical care was necessary to treat or prevent imminent or life-threatening deterioration of the following conditions:  Respiratory failure   Critical care was time spent personally by me on the following activities:  Discussions with consultants, obtaining history from patient or surrogate, examination of patient, ordering and review of laboratory studies, ordering and review of radiographic studies, pulse oximetry and re-evaluation of patient's condition   (including critical care  time)  Medications Ordered in ED Medications  furosemide (LASIX) injection 60 mg (60 mg Intravenous Given 07/12/20 0601)    ED Course  I have reviewed the triage vital signs and the nursing notes.  Pertinent labs & imaging results that were available during my care of the patient were reviewed by me and considered in my medical decision making (see chart for details).    MDM Rules/Calculators/A&P                         After reviewing his chest x-ray lab work was added to the standing orders nurses had already started, and he was given Lasix IV.  His last BUN and creatinine on September 24 were 21 and 1.44.  Patient presented to the emergency department via EMS and they had started patient on oxygen.  On 3 L his pulse ox was 89%.  Wife states he is not on oxygen at home.  When his oxygen was bumped up to 4 L he  will he is maintaining his oxygenation in the high 90s.  Review of his labs show his BNP back up to the level he waswhen he was diagnosed with congestive heart failure although it had halved after admission.  He may need to be admitted to the hospital for further adjustment of his medications.  I will speak to the hospitalist about admission.  Patient's main concern continues to be that he wants breakfast.    At 7:00 AM he had about 200 cc of urinary output.  7:21 AM Dr. Laural BenesJohnson, hospitalist will admit  Final Clinical Impression(s) / ED Diagnoses Final diagnoses:  Acute congestive heart failure, unspecified heart failure type [facility](HCC)  Hypoxia    Rx / DC Orders  Plan admission  Devoria AlbeIva Sarahann Horrell, MD, Concha PyoFACEP    Myanna Ziesmer, MD 07/12/20 [phone]

## 2020-07-12 NOTE — ED Notes (Signed)
Pt does not wear oxygen at home. Room air trial. Pt ambulated to have BM in restroom. Upon return pt was 88%l. Pt placed ba ck on 4l.

## 2020-07-12 NOTE — ED Triage Notes (Signed)
Patient brought in tonight by [facility]. Patient has had generalized weakness with a recent diagnosis of CHF and a-fib. Patient has had blood tinged sputum tonight and shortness of breath.

## 2020-07-12 NOTE — ED Notes (Signed)
Removed 4L O2; pt O2 94-100% on RA at rest

## 2020-07-12 NOTE — H&P (Signed)
History and Physical  [facility]  Jeffery Smith:[id number] DOB: 01/24/1925 DOA: 07/12/2020  PCP: System, Provider Not In  Patient coming from: Home   I have personally briefly reviewed patient's old medical records in [facility]  Chief Complaint: shortness of breath   HPI: Jeffery Smith is a 86 y.o. 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  He is followed closely by his cardiologist in [facility] Dr. Welton Flakes.  He has had difficulty with soft blood pressures and his cardiologist has taken him off carvedilol and losartan and he had been on lasix every other day 20 mg.  He presented today with severe shortness of breath, hypoxia and xray findings of CHF.  He has moderate to severe AS that is being monitored by Dr. Welton Flakes.   He was briefly placed on oxygen and given IV lasix 60 mg with a good diuretic response.    Review of Systems: As per HPI otherwise 10 point review of systems negative.    Past Medical History:  Diagnosis Date  . Arthritis   . Benign head tremor   . Carotid artery occlusion    right side   . Dysrhythmia    irregular heart beat per patient- skips a beat   . Enlarged prostate   . GERD (gastroesophageal reflux disease)   . Hard of hearing   . Hyperlipidemia   . Sore throat     Past Surgical History:  Procedure Laterality Date  . CATARACT EXTRACTION     left  . ENDARTERECTOMY  11/03/2011   Procedure: ENDARTERECTOMY CAROTID;  Surgeon: Larina Earthly, MD;  Location: [facility] OR;  Service: Vascular;  Laterality: Right;  Right Carotid endarterectomy with patch angiopolasty  . TONSILLECTOMY    . TOTAL KNEE ARTHROPLASTY     left  . TOTAL KNEE ARTHROPLASTY Right 09/21/2014   Procedure: RIGHT TOTAL KNEE ARTHROPLASTY;  Surgeon: Loanne Drilling, MD;  Location: [facility] ORS;  Service: Orthopedics;  Laterality: Right;      reports that he has never smoked. He has never used smokeless tobacco. He reports current alcohol use. He reports that he does not use drugs.  Allergies  Allergen Reactions  . Horse-Derived Products Anaphylaxis    Family History  Problem Relation Age of Onset  . Cancer Mother   . Cancer Brother      Prior to Admission medications   Medication Sig Start Date End Date Taking? Authorizing Provider  acetaminophen (TYLENOL) 325 MG tablet Take 650 mg by mouth every 6 (six) hours as needed for mild pain, fever or headache.    [provider]  Apoaequorin (PREVAGEN EXTRA STRENGTH PO) Take 1 tablet by mouth See admin instructions. Chew 1 tablet by mouth every evening    [provider]  aspirin EC 81 MG tablet Take 1 tablet (81 mg total) by mouth daily. 10/07/19   [place] Ouch, Lennart Pall, PA-C  carvedilol (COREG) 3.125 MG tablet Take 1 tablet (3.125 mg total) by mouth 2 (two) times daily with a meal. 07/01/20   Sheikh, Kateri Mc Latif, DO  cholecalciferol (VITAMIN D3) 25 MCG (1000 UNIT) tablet Take 1,000 Units by mouth daily.    [provider]  diphenhydrAMINE (BENADRYL) 25 MG tablet Take 25 mg by mouth daily. Patient not taking: Reported on 07/02/2020    [provider]  fluticasone (FLONASE) 50 MCG/ACT nasal spray  Place 2 sprays into both nostrils daily.    [provider]  furosemide (LASIX) 20 MG tablet Take 1 tablet (20 mg total) by mouth every other day. 07/01/20 09/29/20  Marguerita Merles Latif, DO  ipratropium (ATROVENT) 0.06 % nasal spray Place 2 sprays into both nostrils at bedtime.    [provider]  loratadine (CLARITIN) 10 MG tablet Take 10 mg by mouth daily.    [provider]  losartan (COZAAR) 25 MG tablet Take 0.5 tablets (12.5 mg total) by mouth daily. 07/02/20   Marguerita Merles Latif, DO  omeprazole (PRILOSEC) 20 MG capsule Take 20 mg by mouth at bedtime.     [provider]    Physical Exam: Vitals:   07/12/20  0358 07/12/20 0400 07/12/20 0415 07/12/20 0500  BP: 112/77 115/60  104/70  Pulse: 100   95  Resp: 20  20 (!) 27  Temp: 98.7 F (37.1 C)     TempSrc: Oral     SpO2: (!) 89%  99% 98%  Weight:  77.1 kg    Height:  6\' 1"  (1.854 m)      Constitutional: thin male, awake, alert, NAD, calm, comfortable Eyes: PERRL, lids and conjunctivae normal ENMT: Mucous membranes are moist. Posterior pharynx clear of any exudate or lesions.  Neck: normal, supple, no masses, no thyromegaly Respiratory: clear to auscultation bilaterally, no wheezing, no crackles. Normal respiratory effort. No accessory muscle use.  Cardiovascular: Regular rate and rhythm, no murmurs / rubs / gallops. No extremity edema. 2+ pedal pulses. No carotid bruits.  Abdomen: no tenderness, no masses palpated. No hepatosplenomegaly. Bowel sounds positive.  Musculoskeletal: no clubbing / cyanosis. No joint deformity upper and lower extremities. Good ROM, no contractures. Normal muscle tone.  Skin: no rashes, lesions, ulcers. No induration Neurologic: CN 2-12 grossly intact. Sensation intact, DTR normal. Strength 5/5 in all 4.  Psychiatric: Normal judgment and insight. Alert and oriented x 3. Normal mood.   Labs on Admission: I have personally reviewed following labs and imaging studies  CBC: Recent Labs  Lab 07/12/20 0516  WBC 9.6  NEUTROABS 7.0  HGB 12.7*  HCT 38.9*  MCV 96.3  PLT 202   Basic Metabolic Panel: Recent Labs  Lab 07/12/20 0516  NA 139  K 4.2  CL 105  CO2 25  GLUCOSE 109*  BUN 19  CREATININE 1.26*  CALCIUM 9.2   GFR: Estimated Creatinine Clearance: 39.9 mL/min (A) (by C-G formula based on SCr of 1.26 mg/dL (H)). Liver Function Tests: Recent Labs  Lab 07/12/20 0516  AST 13*  ALT 12  ALKPHOS 51  BILITOT 0.9  PROT 6.0*  ALBUMIN 3.2*   No results for input(s): LIPASE, AMYLASE in the last 168 hours. No results for input(s): AMMONIA in the last 168 hours. Coagulation Profile: No results for  input(s): INR, PROTIME in the last 168 hours. Cardiac Enzymes: No results for input(s): CKTOTAL, CKMB, CKMBINDEX, TROPONINI in the last 168 hours. BNP (last 3 results) No results for input(s): PROBNP in the last 8760 hours. HbA1C: No results for input(s): HGBA1C in the last 72 hours. CBG: No results for input(s): GLUCAP in the last 168 hours. Lipid Profile: No results for input(s): CHOL, HDL, LDLCALC, TRIG, CHOLHDL, LDLDIRECT in the last 72 hours. Thyroid Function Tests: No results for input(s): TSH, T4TOTAL, FREET4, T3FREE, THYROIDAB in the last 72 hours. Anemia Panel: No results for input(s): VITAMINB12, FOLATE, FERRITIN, TIBC, IRON, RETICCTPCT in the last 72 hours. Urine analysis:    Component  Value Date/Time   COLORURINE YELLOW 09/14/2014 0920   APPEARANCEUR CLEAR 09/14/2014 0920   LABSPEC 1.019 09/14/2014 0920   PHURINE 5.5 09/14/2014 0920   GLUCOSEU NEGATIVE 09/14/2014 0920   HGBUR NEGATIVE 09/14/2014 0920   BILIRUBINUR neg 11/04/2019 0903   KETONESUR NEGATIVE 09/14/2014 0920   PROTEINUR Negative 11/04/2019 0903   PROTEINUR NEGATIVE 09/14/2014 0920   UROBILINOGEN negative (A) 11/04/2019 0903   UROBILINOGEN 0.2 09/14/2014 0920   NITRITE neg 11/04/2019 0903   NITRITE NEGATIVE 09/14/2014 0920   LEUKOCYTESUR Negative 11/04/2019 0903    Radiological Exams on Admission: DG Chest Port 1 View  Result Date: 07/12/2020 CLINICAL DATA:  Shortness of breath.  PND. EXAM: PORTABLE CHEST 1 VIEW COMPARISON:  07/02/2020 FINDINGS: Diffuse interstitial opacity with cephalized blood flow and asymmetric right upper lobe density. Chronic cardiomegaly. Small pleural effusions are likely. No pneumothorax. IMPRESSION: CHF pattern. Electronically Signed   By: Marnee Spring M.D.   On: 07/12/2020 05:13   EKG: Independently reviewed.   Assessment/Plan Principal Problem:   Acute exacerbation of CHF (congestive heart failure) (HCC) Active Problems:   Acute respiratory failure with hypoxia  (HCC)   Acute on chronic diastolic CHF (congestive heart failure) (HCC)   Moderate to severe aortic stenosis   H/O carotid endarterectomy   Chronic kidney disease, stage 3a (HCC)   Hyperlipidemia   Hard of hearing   GERD (gastroesophageal reflux disease)   Elevated brain natriuretic peptide (BNP) level   Anemia due to chronic kidney disease   Acute on chronic combined heart failure exacerbation - IV lasix for diuresis.  Monitor output.  Wean oxygen as tolerated.  Cardiology consult given severe AS.   Further recommendations to follow.   DVT prophylaxis: enoxaparin   Code Status: Full   Family Communication: wife at bedside   Disposition Plan: Home   Consults called: cardiology   Admission status: OBV    Savvas Roper MD [facility] How to contact the [facility] Attending or Consulting provider 7A - 7P or covering provider during after hours 7P -7A, for this patient?  1. Check the care team in [facility] and look for a) attending/consulting TR[facility] provider listed and b) the [facility] team listed 2. Log into [email or web address] and use [facility]'s universal password to access. If you do not have the password, please contact the hospital operator. 3. Locate the [facility] provider you are looking for under [facility] and page to a number that you can be directly reached. 4. If you still have difficulty reaching the provider, please page the [facility] (Director on Call) for the Hospitalists listed on amion for assistance.  If 7PM-7AM, please contact night-coverage [email or web address] Password [facility]  07/12/2020, 7:37 AM

## 2020-07-12 NOTE — Discharge Summary (Signed)
Physician Discharge Summary  Jeffery Smith ZDG:[id number] DOB: 22-Dec-1924 DOA: 07/12/2020  PCP: System, Provider Not In Cardiologist: Jeanie Cooks   Admit date: 07/12/2020 Discharge date: 07/12/2020  Admitted From:  Home  Disposition: Home   Recommendations for Outpatient Follow-up:  1. Follow up with cardiology in 1-2 days as already arranged  Discharge Condition: STABLE   CODE STATUS: FULL    Brief Hospitalization Summary: Please see all hospital notes, images, labs for full details of the hospitalization. HPI: Jeffery Smith is a 86 y.o. 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  He is followed closely by his cardiologist in [facility] Dr. Welton Flakes.  He has had difficulty with soft blood pressures and his cardiologist has taken him off carvedilol and losartan and he had been on lasix every other day 20 mg.  He presented today with severe shortness of breath, hypoxia and xray findings of CHF.  He has moderate to severe AS that is being monitored by Dr. Welton Flakes.   He was briefly placed on oxygen and given IV lasix 60 mg with a good diuretic response.    The patient responded very well to IV lasix.  He has diuresed over 1L urine and now off oxygen and sats are 99% on room air.  He was seen by cardiologist Dr. Wyline Mood and he can discharge home with close outpatient follow up with Dr. Welton Flakes.  They are due to see Dr. Welton Flakes in 1-2 days for follow up.  Resume lasix 20 mg every other day starting 10/5.  Continue to Hold coreg and losartan.   Discharge Diagnoses:  Principal Problem:   Acute exacerbation of CHF (congestive heart failure) (HCC) Active Problems:   Acute respiratory failure with hypoxia (HCC)   Acute on chronic diastolic CHF (congestive heart failure) (HCC)   Moderate to severe aortic stenosis   H/O carotid endarterectomy   Chronic  kidney disease, stage 3a (HCC)   Hyperlipidemia   Hard of hearing   GERD (gastroesophageal reflux disease)   Elevated brain natriuretic peptide (BNP) level   Anemia due to chronic kidney disease   Discharge Instructions:  Allergies as of 07/12/2020      Reactions   Horse-derived Products Anaphylaxis      Medication List    STOP taking these medications   carvedilol 3.125 MG tablet Commonly known as: COREG   diphenhydrAMINE 25 MG tablet Commonly known as: BENADRYL   losartan 25 MG tablet Commonly known as: COZAAR     TAKE these medications   acetaminophen 325 MG tablet Commonly known as: TYLENOL Take 650 mg by mouth every 6 (six) hours as needed for mild pain, fever or headache.   aspirin EC 81 MG tablet Take 1 tablet (81 mg total) by mouth daily.   cholecalciferol 25 MCG (1000 UNIT) tablet Commonly known as: VITAMIN D3 Take 1,000 Units by mouth daily.   fluticasone 50 MCG/ACT nasal spray Commonly known as: FLONASE Place 2 sprays into both nostrils daily.   furosemide 20 MG tablet Commonly known as: LASIX Take 1 tablet (20 mg total) by mouth every other day. Start taking on: July 13, 2020   ipratropium 0.06 % nasal spray Commonly known as: ATROVENT Place 2 sprays into both nostrils at bedtime.   loratadine 10 MG tablet Commonly known as: CLARITIN Take 10 mg by mouth daily.   omeprazole 20 MG capsule Commonly  known as: PRILOSEC Take 20 mg by mouth at bedtime.   PREVAGEN EXTRA STRENGTH PO Take 1 tablet by mouth See admin instructions. Chew 1 tablet by mouth every evening       Follow-up Information    Laurier Nancy, MD. Schedule an appointment as soon as possible for a visit in 1 day(s).   Specialty: Cardiology Contact information: [address] [place] [address] [phone]              Allergies  Allergen Reactions  . Horse-Derived Products Anaphylaxis   Allergies as of 07/12/2020      Reactions   Horse-derived Products  Anaphylaxis      Medication List    STOP taking these medications   carvedilol 3.125 MG tablet Commonly known as: COREG   diphenhydrAMINE 25 MG tablet Commonly known as: BENADRYL   losartan 25 MG tablet Commonly known as: COZAAR     TAKE these medications   acetaminophen 325 MG tablet Commonly known as: TYLENOL Take 650 mg by mouth every 6 (six) hours as needed for mild pain, fever or headache.   aspirin EC 81 MG tablet Take 1 tablet (81 mg total) by mouth daily.   cholecalciferol 25 MCG (1000 UNIT) tablet Commonly known as: VITAMIN D3 Take 1,000 Units by mouth daily.   fluticasone 50 MCG/ACT nasal spray Commonly known as: FLONASE Place 2 sprays into both nostrils daily.   furosemide 20 MG tablet Commonly known as: LASIX Take 1 tablet (20 mg total) by mouth every other day. Start taking on: July 13, 2020   ipratropium 0.06 % nasal spray Commonly known as: ATROVENT Place 2 sprays into both nostrils at bedtime.   loratadine 10 MG tablet Commonly known as: CLARITIN Take 10 mg by mouth daily.   omeprazole 20 MG capsule Commonly known as: PRILOSEC Take 20 mg by mouth at bedtime.   PREVAGEN EXTRA STRENGTH PO Take 1 tablet by mouth See admin instructions. Chew 1 tablet by mouth every evening       Procedures/Studies: DG Chest 1 View  Result Date: 07/01/2020 CLINICAL DATA:  Shortness of breath EXAM: CHEST  1 VIEW COMPARISON:  06/28/2020 FINDINGS: Cardiac shadow is stable. Aortic calcifications are again seen. Patchy airspace opacities are again identified but improved from the prior study. No sizable effusion is seen. No bony abnormality is noted. IMPRESSION: Stable patchy bilateral airspace opacity. Electronically Signed   By: Alcide Clever M.D.   On: 07/01/2020 03:51   DG Chest 1 View  Result Date: 06/28/2020 CLINICAL DATA:  Shortness of breath for 2 weeks EXAM: CHEST  1 VIEW COMPARISON:  06/18/2019 FINDINGS: Cardiac shadow is stable. Aortic calcifications are  noted. Diffuse bilateral infiltrates are identified. No sizable effusion is seen. No bony abnormality is noted. IMPRESSION: New diffuse bilateral infiltrates consistent with multifocal pneumonia. Electronically Signed   By: Alcide Clever M.D.   On: 06/28/2020 01:57   DG Chest 2 View  Result Date: 07/02/2020 CLINICAL DATA:  Lethargy, hypotension EXAM: CHEST - 2 VIEW COMPARISON:  07/01/2020 FINDINGS: Frontal and lateral views of the chest demonstrates stable enlargement of the cardiac silhouette. There is diffuse interstitial scarring throughout the lungs, with trace bilateral pleural effusions. There is persistent airspace disease at the right apex. Remaining areas of consolidation demonstrate significant improvement and resolution. No acute bony abnormalities. IMPRESSION: 1. Findings most consistent with improving pulmonary edema. Residual right upper lobe airspace disease and small bilateral pleural effusions. Electronically Signed   By: Sharlet Salina  M.D.   On: 07/02/2020 18:24   NM Pulmonary Perfusion  Result Date: 06/28/2020 CLINICAL DATA:  Positive D-dimer, shortness of breath EXAM: NUCLEAR MEDICINE PERFUSION LUNG SCAN TECHNIQUE: Perfusion images were obtained in multiple projections after intravenous injection of radiopharmaceutical. Ventilation scans intentionally deferred if perfusion scan and chest x-ray adequate for interpretation during COVID 19 epidemic. RADIOPHARMACEUTICALS:  3.82 mCi Tc-3576m MAA IV COMPARISON:  06/28/2020 FINDINGS: Planar images of the chest are obtained during the perfusion phase of the exam in multiple projections. There is a small peripheral perfusion defect within the right upper lobe. This is smaller than the corresponding opacity on chest x-ray. No other wedge-shaped perfusion defects are observed. IMPRESSION: 1. No evidence of pulmonary embolus based on PISAPED criteria. Electronically Signed   By: Sharlet SalinaMichael  Brown M.D.   On: 06/28/2020 15:11   DG Chest Port 1  View  Result Date: 07/12/2020 CLINICAL DATA:  Shortness of breath.  PND. EXAM: PORTABLE CHEST 1 VIEW COMPARISON:  07/02/2020 FINDINGS: Diffuse interstitial opacity with cephalized blood flow and asymmetric right upper lobe density. Chronic cardiomegaly. Small pleural effusions are likely. No pneumothorax. IMPRESSION: CHF pattern. Electronically Signed   By: Marnee SpringJonathon  Watts M.D.   On: 07/12/2020 05:13   ECHOCARDIOGRAM COMPLETE BUBBLE STUDY  Result Date: 06/28/2020    ECHOCARDIOGRAM REPORT   Patient Name:   Juliann PulseFREDERICK M Mizuno Date of Exam: 06/28/2020 Medical Rec #:  [id number]            Height:       73.0 in Accession #:    [id number]           Weight:       175.0 lb Date of Birth:  1925-05-23            BSA:          2.033 m Patient Age:    86 years             BP:           99/82 mmHg Patient Gender: M                    HR:           44 bpm. Exam Location:  [facility] Procedure: 2D Echo, Cardiac Doppler, Color Doppler and Saline Contrast Bubble            Study Indications:     Shortness of breath, Elevated brain natriuretic peptide  History:         Patient has prior history of Echocardiogram examinations, most                  recent 07/16/2019. Hypertrophic Cardiomyopathy; Risk                  Factors:Dyslipidemia. Dysrhythmia.  Sonographer:     Cristela BlueJerry Hege RDCS (AE) Referring Phys:  [id number] Zerita BoersZACHARY P SMITH Diagnosing Phys: Adrian BlackwaterShaukat Khan MD  Sonographer Comments: Technically challenging study due to limited acoustic windows and no parasternal window. IMPRESSIONS  1. Left ventricular ejection fraction, by estimation, is 35 to 40%. The left ventricle has moderately decreased function. The left ventricle demonstrates global hypokinesis. There is moderate concentric left ventricular hypertrophy. Left ventricular diastolic parameters are consistent with Grade I diastolic dysfunction (impaired relaxation).  2. Right ventricular systolic function is low normal. The right ventricular size is mildly enlarged.  3. Left  atrial size was moderately dilated.  4. Right atrial size was moderately dilated.  5. The mitral  valve is degenerative. Mild mitral valve regurgitation. No evidence of mitral stenosis. Moderate to severe mitral annular calcification.  6. The aortic valve is calcified. Aortic valve regurgitation is mild to moderate. Moderate to severe aortic valve stenosis.  7. The inferior vena cava is normal in size with greater than 50% respiratory variability, suggesting right atrial pressure of 3 mmHg.  8. Agitated saline contrast bubble study was negative, with no evidence of any interatrial shunt. FINDINGS  Left Ventricle: Left ventricular ejection fraction, by estimation, is 35 to 40%. The left ventricle has moderately decreased function. The left ventricle demonstrates global hypokinesis. The left ventricular internal cavity size was normal in size. There is moderate concentric left ventricular hypertrophy. Left ventricular diastolic parameters are consistent with Grade I diastolic dysfunction (impaired relaxation). Right Ventricle: The right ventricular size is mildly enlarged. No increase in right ventricular wall thickness. Right ventricular systolic function is low normal. Left Atrium: Left atrial size was moderately dilated. Right Atrium: Right atrial size was moderately dilated. Pericardium: There is no evidence of pericardial effusion. Mitral Valve: The mitral valve is degenerative in appearance. Moderate to severe mitral annular calcification. Mild mitral valve regurgitation. No evidence of mitral valve stenosis. Tricuspid Valve: The tricuspid valve is normal in structure. Tricuspid valve regurgitation is mild . No evidence of tricuspid stenosis. Aortic Valve: The aortic valve is calcified. Aortic valve regurgitation is mild to moderate. Moderate to severe aortic stenosis is present. Aortic valve mean gradient measures 24.0 mmHg. Aortic valve peak gradient measures 44.2 mmHg. Aortic valve area, by VTI measures 0.52  cm. Pulmonic Valve: The pulmonic valve was normal in structure. Pulmonic valve regurgitation is mild. No evidence of pulmonic stenosis. Aorta: The aortic root is normal in size and structure. Venous: The inferior vena cava is normal in size with greater than 50% respiratory variability, suggesting right atrial pressure of 3 mmHg. IAS/Shunts: No atrial level shunt detected by color flow Doppler. Agitated saline contrast was given intravenously to evaluate for intracardiac shunting. Agitated saline contrast bubble study was negative, with no evidence of any interatrial shunt.  LEFT VENTRICLE PLAX 2D LVIDd:         4.64 cm      Diastology LVIDs:         3.63 cm      LV e' medial:    4.90 cm/s LV PW:         1.35 cm      LV E/e' medial:  14.0 LV IVS:        1.28 cm      LV e' lateral:   4.68 cm/s LVOT diam:     2.00 cm      LV E/e' lateral: 14.7 LV SV:         32 LV SV Index:   16 LVOT Area:     3.14 cm  LV Volumes (MOD) LV vol d, MOD A2C: 105.0 ml LV vol d, MOD A4C: 140.0 ml LV vol s, MOD A2C: 75.0 ml LV vol s, MOD A4C: 85.6 ml LV SV MOD A2C:     30.0 ml LV SV MOD A4C:     140.0 ml LV SV MOD BP:      42.0 ml RIGHT VENTRICLE RV Basal diam:  4.55 cm RV S prime:     12.40 cm/s TAPSE (M-mode): 4.0 cm LEFT ATRIUM             Index       RIGHT ATRIUM  Index LA diam:        3.10 cm 1.52 cm/m  RA Area:     21.70 cm LA Vol (A2C):   47.0 ml 23.12 ml/m RA Volume:   68.60 ml  33.74 ml/m LA Vol (A4C):   32.5 ml 15.99 ml/m LA Biplane Vol: 39.4 ml 19.38 ml/m  AORTIC VALVE AV Area (Vmax):    0.69 cm AV Area (Vmean):   0.56 cm AV Area (VTI):     0.52 cm AV Vmax:           332.25 cm/s AV Vmean:          221.250 cm/s AV VTI:            0.623 m AV Peak Grad:      44.2 mmHg AV Mean Grad:      24.0 mmHg LVOT Vmax:         73.10 cm/s LVOT Vmean:        39.300 cm/s LVOT VTI:          0.103 m LVOT/AV VTI ratio: 0.17  AORTA Ao Root diam: 3.10 cm MITRAL VALVE               TRICUSPID VALVE MV Area (PHT): 4.44 cm    TR Peak  grad:   33.4 mmHg MV Decel Time: 171 msec    TR Vmax:        289.00 cm/s MV E velocity: 68.60 cm/s MV A velocity: 96.00 cm/s  SHUNTS MV E/A ratio:  0.71        Systemic VTI:  0.10 m                            Systemic Diam: 2.00 cm Adrian Blackwater MD Electronically signed by Adrian Blackwater MD Signature Date/Time: 06/28/2020/5:03:55 PM    Final       Subjective: PT feeling much better after diuresis, He is off oxygen and on room air.   Discharge Exam: Vitals:   07/12/20 1200 07/12/20 1215  BP: 95/64   Pulse: 61 89  Resp:    Temp:    SpO2: 90% 94%   Vitals:   07/12/20 0948 07/12/20 1100 07/12/20 1200 07/12/20 1215  BP: 104/77 109/73 95/64   Pulse: 88 89 61 89  Resp: 20     Temp: 98.2 F (36.8 C)     TempSrc: Oral     SpO2: 98% 96% 90% 94%  Weight:      Height:       General: Pt is alert, awake, not in acute distress Cardiovascular: RRR, S1/S2 +, no rubs, no gallops Respiratory: CTA bilaterally, no wheezing, no rhonchi Abdominal: Soft, NT, ND, bowel sounds + Extremities: no edema, no cyanosis   The results of significant diagnostics from this hospitalization (including imaging, microbiology, ancillary and laboratory) are listed below for reference.     Microbiology: Recent Results (from the past 240 hour(s))  Resp Panel by RT PCR (RSV, Flu A&B, Covid) - Nasopharyngeal Swab     Status: None   Collection Time: 07/12/20  9:45 AM   Specimen: Nasopharyngeal Swab  Result Value Ref Range Status   SARS Coronavirus 2 by RT PCR NEGATIVE NEGATIVE Final    Comment: (NOTE) SARS-CoV-2 target nucleic acids are NOT DETECTED.  The SARS-CoV-2 RNA is generally detectable in upper respiratoy specimens during the acute phase of infection. The lowest concentration of SARS-CoV-2 viral copies this assay can detect is  131 copies/mL. A negative result does not preclude SARS-Cov-2 infection and should not be used as the sole basis for treatment or other patient management decisions. A negative result  may occur with  improper specimen collection/handling, submission of specimen other than nasopharyngeal swab, presence of viral mutation(s) within the areas targeted by this assay, and inadequate number of viral copies (<131 copies/mL). A negative result must be combined with clinical observations, patient history, and epidemiological information. The expected result is Negative.  Fact Sheet for Patients:  https://www.moore.com/  Fact Sheet for Healthcare Providers:  https://www.young.biz/  This test is no t yet approved or cleared by the [place] FDA and  has been authorized for detection and/or diagnosis of SARS-CoV-2 by FDA under an Emergency Use Authorization (EUA). This EUA will remain  in effect (meaning this test can be used) for the duration of the COVID-19 declaration under Section 564(b)(1) of the Act, 21 U.S.C. section 360bbb-3(b)(1), unless the authorization is terminated or revoked sooner.     Influenza A by PCR NEGATIVE NEGATIVE Final   Influenza B by PCR NEGATIVE NEGATIVE Final    Comment: (NOTE) The Xpert Xpress SARS-CoV-2/FLU/RSV assay is intended as an aid in  the diagnosis of influenza from Nasopharyngeal swab specimens and  should not be used as a sole basis for treatment. Nasal washings and  aspirates are unacceptable for Xpert Xpress SARS-CoV-2/FLU/RSV  testing.  Fact Sheet for Patients: https://www.moore.com/  Fact Sheet for Healthcare Providers: https://www.young.biz/  This test is not yet approved or cleared by the [place] FDA and  has been authorized for detection and/or diagnosis of SARS-CoV-2 by  FDA under an Emergency Use Authorization (EUA). This EUA will remain  in effect (meaning this test can be used) for the duration of the  Covid-19 declaration under Section 564(b)(1) of the Act, 21  U.S.C. section 360bbb-3(b)(1), unless the authorization is  terminated  or revoked.    Respiratory Syncytial Virus by PCR NEGATIVE NEGATIVE Final    Comment: (NOTE) Fact Sheet for Patients: https://www.moore.com/  Fact Sheet for Healthcare Providers: https://www.young.biz/  This test is not yet approved or cleared by the [place] FDA and  has been authorized for detection and/or diagnosis of SARS-CoV-2 by  FDA under an Emergency Use Authorization (EUA). This EUA will remain  in effect (meaning this test can be used) for the duration of the  COVID-19 declaration under Section 564(b)(1) of the Act, 21 U.S.C.  section 360bbb-3(b)(1), unless the authorization is terminated or  revoked. Performed at [facility], [address], [place], [place] [address]      Labs: BNP (last 3 results) Recent Labs    07/01/20 0350 07/02/20 1632 07/12/20 0516  BNP 624.6* 752.9* 1,362.0*   Basic Metabolic Panel: Recent Labs  Lab 07/12/20 0516  NA 139  K 4.2  CL 105  CO2 25  GLUCOSE 109*  BUN 19  CREATININE 1.26*  CALCIUM 9.2   Liver Function Tests: Recent Labs  Lab 07/12/20 0516  AST 13*  ALT 12  ALKPHOS 51  BILITOT 0.9  PROT 6.0*  ALBUMIN 3.2*   No results for input(s): LIPASE, AMYLASE in the last 168 hours. No results for input(s): AMMONIA in the last 168 hours. CBC: Recent Labs  Lab 07/12/20 0516  WBC 9.6  NEUTROABS 7.0  HGB 12.7*  HCT 38.9*  MCV 96.3  PLT 202   Cardiac Enzymes: No results for input(s): CKTOTAL, CKMB, CKMBINDEX, TROPONINI in the last 168 hours. BNP: Invalid input(s): POCBNP CBG:  No results for input(s): GLUCAP in the last 168 hours. D-Dimer No results for input(s): DDIMER in the last 72 hours. Hgb A1c Recent Labs    07/12/20 0748  HGBA1C 5.8*   Lipid Profile Recent Labs    07/12/20 0748  CHOL 175  HDL 53  LDLCALC 109*  TRIG 65  CHOLHDL 3.3   Thyroid function studies Recent Labs    07/12/20 0748  TSH 1.095   Anemia work up No results for input(s):  VITAMINB12, FOLATE, FERRITIN, TIBC, IRON, RETICCTPCT in the last 72 hours. Urinalysis    Component Value Date/Time   COLORURINE YELLOW 09/14/2014 0920   APPEARANCEUR CLEAR 09/14/2014 0920   LABSPEC 1.019 09/14/2014 0920   PHURINE 5.5 09/14/2014 0920   GLUCOSEU NEGATIVE 09/14/2014 0920   HGBUR NEGATIVE 09/14/2014 0920   BILIRUBINUR neg 11/04/2019 0903   KETONESUR NEGATIVE 09/14/2014 0920   PROTEINUR Negative 11/04/2019 0903   PROTEINUR NEGATIVE 09/14/2014 0920   UROBILINOGEN negative (A) 11/04/2019 0903   UROBILINOGEN 0.2 09/14/2014 0920   NITRITE neg 11/04/2019 0903   NITRITE NEGATIVE 09/14/2014 0920   LEUKOCYTESUR Negative 11/04/2019 0903   Sepsis Labs Invalid input(s): PROCALCITONIN,  WBC,  LACTICIDVEN Microbiology Recent Results (from the past 240 hour(s))  Resp Panel by RT PCR (RSV, Flu A&B, Covid) - Nasopharyngeal Swab     Status: None   Collection Time: 07/12/20  9:45 AM   Specimen: Nasopharyngeal Swab  Result Value Ref Range Status   SARS Coronavirus 2 by RT PCR NEGATIVE NEGATIVE Final    Comment: (NOTE) SARS-CoV-2 target nucleic acids are NOT DETECTED.  The SARS-CoV-2 RNA is generally detectable in upper respiratoy specimens during the acute phase of infection. The lowest concentration of SARS-CoV-2 viral copies this assay can detect is 131 copies/mL. A negative result does not preclude SARS-Cov-2 infection and should not be used as the sole basis for treatment or other patient management decisions. A negative result may occur with  improper specimen collection/handling, submission of specimen other than nasopharyngeal swab, presence of viral mutation(s) within the areas targeted by this assay, and inadequate number of viral copies (<131 copies/mL). A negative result must be combined with clinical observations, patient history, and epidemiological information. The expected result is Negative.  Fact Sheet for Patients:   https://www.moore.com/  Fact Sheet for Healthcare Providers:  https://www.young.biz/  This test is no t yet approved or cleared by the [place] FDA and  has been authorized for detection and/or diagnosis of SARS-CoV-2 by FDA under an Emergency Use Authorization (EUA). This EUA will remain  in effect (meaning this test can be used) for the duration of the COVID-19 declaration under Section 564(b)(1) of the Act, 21 U.S.C. section 360bbb-3(b)(1), unless the authorization is terminated or revoked sooner.     Influenza A by PCR NEGATIVE NEGATIVE Final   Influenza B by PCR NEGATIVE NEGATIVE Final    Comment: (NOTE) The Xpert Xpress SARS-CoV-2/FLU/RSV assay is intended as an aid in  the diagnosis of influenza from Nasopharyngeal swab specimens and  should not be used as a sole basis for treatment. Nasal washings and  aspirates are unacceptable for Xpert Xpress SARS-CoV-2/FLU/RSV  testing.  Fact Sheet for Patients: https://www.moore.com/  Fact Sheet for Healthcare Providers: https://www.young.biz/  This test is not yet approved or cleared by the [place] FDA and  has been authorized for detection and/or diagnosis of SARS-CoV-2 by  FDA under an Emergency Use Authorization (EUA). This EUA will remain  in effect (meaning this test can be  used) for the duration of the  Covid-19 declaration under Section 564(b)(1) of the Act, 21  U.S.C. section 360bbb-3(b)(1), unless the authorization is  terminated or revoked.    Respiratory Syncytial Virus by PCR NEGATIVE NEGATIVE Final    Comment: (NOTE) Fact Sheet for Patients: https://www.moore.com/  Fact Sheet for Healthcare Providers: https://www.young.biz/  This test is not yet approved or cleared by the [place] FDA and  has been authorized for detection and/or diagnosis of SARS-CoV-2 by  FDA under an Emergency  Use Authorization (EUA). This EUA will remain  in effect (meaning this test can be used) for the duration of the  COVID-19 declaration under Section 564(b)(1) of the Act, 21 U.S.C.  section 360bbb-3(b)(1), unless the authorization is terminated or  revoked. Performed at [facility], [address], [place], [place] [address]    Time coordinating discharge:   SIGNED:  Standley Dakins, MD  [facility] 07/12/2020, 12:47 PM How to contact the [facility] Attending or Consulting provider 7A - 7P or covering provider during after hours 7P -7A, for this patient?  1. Check the care team in [facility] and look for a) attending/consulting [facility] provider listed and b) the [facility] team listed 2. Log into [email or web address] and use [facility]'s universal password to access. If you do not have the password, please contact the hospital operator. 3. Locate the [facility] provider you are looking for under [facility] and page to a number that you can be directly reached. 4. If you still have difficulty reaching the provider, please page the [facility] (Director on Call) for the Hospitalists listed on amion for assistance.

## 2020-07-12 NOTE — TOC Initial Note (Signed)
Transition of Care [facility]) - Initial/Assessment Note   Patient Details  Name: Jeffery Smith MRN: [id number] Date of Birth: [birth year]  Transition of Care [facility]) CM/SW Contact:    Ewing Schlein, LCSW Phone Number: 07/12/2020, 11:07 AM  Clinical Narrative: Patient is a 86 year old male who was admitted for acute exacerbation of CHF. Patient has a history of acute respiratory failure with hypoxia, aortic stenosis, carotid endarterectomy, chronic kidney disease, hyperlipidemia, GERD, elevated brain natriuretic peptide level, and anemia due to chronic kidney disease. TOC received consult for CHF screening. CSW spoke with patient's wife, [name], to complete assessment. Per wife, patient lives with her at home. Patient has a walker and cane and is currently active with [facility] for HH (PT/OT/RN). Wife reported patient is able to afford his medications each month and takes these as prescribed. Patient is currently able to complete all his ADLs independently.  Per CHF screening, patient follows a heart healthy diet and is supposed to have a sodium-free diet, but they did not realize there was "too much" salt in the food they were eating at home. Wife reported patient restricts his fluid intake, but was told by his cardiologist he no longer needs to take daily weights. Patient has a stress test scheduled for October 12 with Dr. Welton Flakes in [place]. TOC to follow.  Expected Discharge Plan: Home w Home Health Services Barriers to Discharge: Continued Medical Work up  Patient Goals and CMS Choice Patient states their goals for this hospitalization and ongoing recovery are:: Discharge home with [facility] CMS Medicare.gov Compare Post Acute Care list provided to:: Patient Represenative (must comment) [name] (wife)) Choice offered to / list presented to : Spouse  Expected Discharge Plan and Services Expected Discharge Plan: Home w Home Health Services In-house Referral: Clinical Social Work Discharge  Planning Services: NA Post Acute Care Choice: Home Health Living arrangements for the past 2 months: Single Family Home HH Agency: [facility] Date HH Agency Contacted: 07/12/20 Time HH Agency Contacted: 1058 Representative spoke with at [facility] Agency: [name]  Prior Living Arrangements/Services Living arrangements for the past 2 months: Single Family Home Lives with:: Spouse Patient language and need for interpreter reviewed:: Yes Do you feel safe going back to the place where you live?: Yes      Need for Family Participation in Patient Care: Yes (Comment) (Patient has difficulty hearing) Care giver support system in place?: Yes (comment) Current home services: Home PT, Home RN, DME (Walker, cane) Criminal Activity/Legal Involvement Pertinent to Current Situation/Hospitalization: No - Comment as needed  Permission Sought/Granted Permission sought to share information with : Family Supports, Oceanographer granted to share info w AGENCY: [facility]  Emotional Assessment Appearance:: Appears stated age Orientation: : Oriented to Self, Oriented to Place, Oriented to  Time, Oriented to Situation Alcohol / Substance Use: Not Applicable Psych Involvement: No (comment)  Admission diagnosis:  Acute exacerbation of CHF (congestive heart failure) (HCC) [I50.9] Patient Active Problem List   Diagnosis Date Noted  . Acute exacerbation of CHF (congestive heart failure) (HCC) 07/12/2020  . Hyperlipidemia   . Hard of hearing   . GERD (gastroesophageal reflux disease)   . Elevated brain natriuretic peptide (BNP) level   . Anemia due to chronic kidney disease   . Acute CHF (congestive heart failure) (HCC) 06/28/2020  . Acute respiratory failure with hypoxia (HCC) 06/28/2020  . Acute on chronic diastolic CHF (congestive heart failure) (HCC) 06/28/2020  . Moderate to severe aortic  stenosis 06/28/2020  . H/O carotid endarterectomy 06/28/2020  .  Elevated troponin 06/28/2020  . Chronic kidney disease, stage 3a (HCC) 06/28/2020  . OA (osteoarthritis) of knee 09/21/2014  . Occlusion and stenosis of carotid artery without mention of cerebral infarction 11/21/2011   PCP:  System, Provider Not In Pharmacy:   [facility] [place]. - [place], [place] - [address] [place] [address] Phone: [phone] Fax: [phone]  Readmission Risk Interventions No flowsheet data found.

## 2020-07-12 NOTE — Consult Note (Addendum)
Cardiology Consultation:   Patient ID: Jeffery Smith MRN: [id number]; DOB: 1925/01/07  Admit date: 07/12/2020 Date of Consult: 07/12/2020  Primary Care Provider: System, Provider Not In [facility] Cardiologist: Laurier Nancy, MD   [facility] Electrophysiologist:  None     Patient Profile:   Jeffery Smith is a 86 y.o. male with a hx of systolic and diastolic CHF who is being seen today for the evaluation of CHF at the request of Dr. Laural Benes.  History of Present Illness:   Mr. Jeffery Smith is a 86 yo patient with history of systolic and diastolic CHF, discharged from [facility] 07/01/20 with the same  and pneumonia . Echo 9/21 LVEF 35-40% global HK, grade 1 DD, mod-severe AS AVA 0.52 cm2,sent home on losartan 12.5 mg daily, lasix 20 mg every other day  And coreg 3.215 mg bid Back in [place] 07/02/20 with hypotension CKD stage 3-carvedilol stopped, spoke with cardiologist over the weekend and losartan stopped because of hypotension.  History of RCEA, HLD.  Patient returns with recurrent Respiratory failure secondary to CHF, BNP 1362, Troponin 55, Crt 1.26. Patient had orthopnea Fri and Sat night. Cardiologist stopped losartan over the weekend due to hypotension. He had very salty brunswick stew and fast food as well. Legs started to swell. 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 He denies chest pain or syncope.    Past Medical History:  Diagnosis Date  . Arthritis   . Benign head tremor   . Carotid artery occlusion    right side   . Dysrhythmia    irregular heart beat per patient- skips a beat   . Enlarged prostate   . GERD (gastroesophageal reflux disease)   . Hard of hearing   . Hyperlipidemia   . Sore throat     Past Surgical History:  Procedure Laterality Date  . CATARACT EXTRACTION     left  . ENDARTERECTOMY  11/03/2011   Procedure: ENDARTERECTOMY CAROTID;  Surgeon: Larina Earthly, MD;  Location: [facility] OR;  Service: Vascular;   Laterality: Right;  Right Carotid endarterectomy with patch angiopolasty  . TONSILLECTOMY    . TOTAL KNEE ARTHROPLASTY     left  . TOTAL KNEE ARTHROPLASTY Right 09/21/2014   Procedure: RIGHT TOTAL KNEE ARTHROPLASTY;  Surgeon: Loanne Drilling, MD;  Location: [facility] ORS;  Service: Orthopedics;  Laterality: Right;     Home Medications:  Prior to Admission medications   Medication Sig Start Date End Date Taking? Authorizing Provider  acetaminophen (TYLENOL) 325 MG tablet Take 650 mg by mouth every 6 (six) hours as needed for mild pain, fever or headache.   Yes [provider]  Apoaequorin (PREVAGEN EXTRA STRENGTH PO) Take 1 tablet by mouth See admin instructions. Chew 1 tablet by mouth every evening   Yes [provider]  aspirin EC 81 MG tablet Take 1 tablet (81 mg total) by mouth daily. 10/07/19  Yes Strader, [place] M, PA-C  cholecalciferol (VITAMIN D3) 25 MCG (1000 UNIT) tablet Take 1,000 Units by mouth daily.   Yes [provider]  fluticasone (FLONASE) 50 MCG/ACT nasal spray Place 2 sprays into both nostrils daily.   Yes [provider]  ipratropium (ATROVENT) 0.06 % nasal spray Place 2 sprays into both nostrils at bedtime.   Yes [provider]  loratadine (CLARITIN) 10 MG tablet Take 10 mg by mouth daily.   Yes [provider]  omeprazole (PRILOSEC) 20 MG capsule Take 20 mg  by mouth at bedtime.    Yes [provider]  carvedilol (COREG) 3.125 MG tablet Take 1 tablet (3.125 mg total) by mouth 2 (two) times daily with a meal. Patient not taking: Reported on 07/12/2020 07/01/20   Marguerita Merles Latif, DO  diphenhydrAMINE (BENADRYL) 25 MG tablet Take 25 mg by mouth daily. Patient not taking: Reported on 07/02/2020    [provider]  furosemide (LASIX) 20 MG tablet Take 1 tablet (20 mg total) by mouth every other day. Patient not taking: Reported on 07/12/2020 07/01/20 09/29/20  Marguerita Merles Latif, DO  losartan (COZAAR) 25 MG  tablet Take 0.5 tablets (12.5 mg total) by mouth daily. Patient not taking: Reported on 07/12/2020 07/02/20   Merlene Laughter, DO    Inpatient Medications: Scheduled Meds: . aspirin EC  81 mg Oral Daily  . carvedilol  3.125 mg Oral BID WC  . enoxaparin (LOVENOX) injection  40 mg Subcutaneous Q24H  . pantoprazole  40 mg Oral QHS  . sodium chloride flush  3 mL Intravenous Q12H   Continuous Infusions: . sodium chloride     PRN Meds: sodium chloride, acetaminophen, ondansetron (ZOFRAN) IV, sodium chloride flush, traZODone  Allergies:    Allergies  Allergen Reactions  . Horse-Derived Products Anaphylaxis    Social History:   Social History   Socioeconomic History  . Marital status: Married    Spouse name: Not on file  . Number of children: Not on file  . Years of education: Not on file  . Highest education level: Not on file  Occupational History  . Not on file  Tobacco Use  . Smoking status: Never Smoker  . Smokeless tobacco: Never Used  Vaping Use  . Vaping Use: Never used  Substance and Sexual Activity  . Alcohol use: Yes    Comment: 1 glass of wine daily  . Drug use: No  . Sexual activity: Not on file  Other Topics Concern  . Not on file  Social History Narrative  . Not on file   Social Determinants of Health   Financial Resource Strain:   . Difficulty of Paying Living Expenses: Not on file  Food Insecurity:   . Worried About Programme researcher, broadcasting/film/video in the Last Year: Not on file  . Ran Out of Food in the Last Year: Not on file  Transportation Needs:   . Lack of Transportation (Medical): Not on file  . Lack of Transportation (Non-Medical): Not on file  Physical Activity:   . Days of Exercise per Week: Not on file  . Minutes of Exercise per Session: Not on file  Stress:   . Feeling of Stress : Not on file  Social Connections:   . Frequency of Communication with Friends and Family: Not on file  . Frequency of Social Gatherings with Friends and Family: Not  on file  . Attends Religious Services: Not on file  . Active Member of Clubs or Organizations: Not on file  . Attends Banker Meetings: Not on file  . Marital Status: Not on file  Intimate Partner Violence:   . Fear of Current or Ex-Partner: Not on file  . Emotionally Abused: Not on file  . Physically Abused: Not on file  . Sexually Abused: Not on file    Family History:     Family History  Problem Relation Age of Onset  . Cancer Mother   . Cancer Brother      ROS:  Please see the history of  present illness.  Review of Systems  Constitutional: Negative.  HENT: Negative.   Cardiovascular: Positive for dyspnea on exertion, leg swelling and orthopnea.  Respiratory: Negative.   Endocrine: Negative.   Hematologic/Lymphatic: Negative.   Musculoskeletal: Positive for arthritis and stiffness.  Gastrointestinal: Negative.   Genitourinary: Negative.   Neurological: Negative.     All other ROS reviewed and negative.     Physical Exam/Data:   Vitals:   07/12/20 0358 07/12/20 0400 07/12/20 0415 07/12/20 0500  BP: 112/77 115/60  104/70  Pulse: 100   95  Resp: 20  20 (!) 27  Temp: 98.7 F (37.1 C)     TempSrc: Oral     SpO2: (!) 89%  99% 98%  Weight:  77.1 kg    Height:  6\' 1"  (1.854 m)     No intake or output data in the 24 hours ending 07/12/20 0802 Last 3 Weights 07/12/2020 07/02/2020 07/01/2020  Weight (lbs) 170 lb 170 lb 166 lb 6.4 oz  Weight (kg) 77.111 kg 77.111 kg 75.479 kg     Body mass index is 22.43 kg/m.  General:  Thin, in no acute distress HEENT: normal Lymph: no adenopathy Neck: no JVD Endocrine:  No thryomegaly Vascular: No carotid bruits; FA pulses 2+ bilaterally without bruits  Cardiac:  normal S1, S2; RRR; 3/6 systolic murmur LSB Lungs:  Rales left lung base Abd: soft, nontender, no hepatomegaly  Ext: no edema Musculoskeletal:  No deformities, BUE and BLE strength normal and equal Skin: warm and dry  Neuro:  CNs 2-12 intact, no focal  abnormalities noted Psych:  Normal affect   EKG:  The EKG was personally reviewed and demonstrates:  NSR with PVC's PAC's, RBBB, IVCD Telemetry:  Telemetry was personally reviewed and demonstrates:  NSR with PVC's PAC's  Relevant CV Studies:  Echo 06/29/20  FINDINGS   Left Ventricle: Left ventricular ejection fraction, by estimation, is 35  to 40%. The left ventricle has moderately decreased function. The left  ventricle demonstrates global hypokinesis. The left ventricular internal  cavity size was normal in size.  There is moderate concentric left ventricular hypertrophy. Left  ventricular diastolic parameters are consistent with Grade I diastolic  dysfunction (impaired relaxation).   Right Ventricle: The right ventricular size is mildly enlarged. No  increase in right ventricular wall thickness. Right ventricular systolic  function is low normal.   Left Atrium: Left atrial size was moderately dilated.   Right Atrium: Right atrial size was moderately dilated.   Pericardium: There is no evidence of pericardial effusion.   Mitral Valve: The mitral valve is degenerative in appearance. Moderate to  severe mitral annular calcification. Mild mitral valve regurgitation. No  evidence of mitral valve stenosis.   Tricuspid Valve: The tricuspid valve is normal in structure. Tricuspid  valve regurgitation is mild . No evidence of tricuspid stenosis.   Aortic Valve: The aortic valve is calcified. Aortic valve regurgitation is  mild to moderate. Moderate to severe aortic stenosis is present. Aortic  valve mean gradient measures 24.0 mmHg. Aortic valve peak gradient  measures 44.2 mmHg. Aortic valve area,  by VTI measures 0.52 cm.   Pulmonic Valve: The pulmonic valve was normal in structure. Pulmonic valve  regurgitation is mild. No evidence of pulmonic stenosis.   Aorta: The aortic root is normal in size and structure.   Venous: The inferior vena cava is normal in size with greater  than 50%  respiratory variability, suggesting right atrial pressure of 3 mmHg.   IAS/Shunts:  No atrial level shunt detected by color flow Doppler. Agitated  saline contrast was given intravenously to evaluate for intracardiac  shunting. Agitated saline contrast bubble study was negative, with no  evidence of any interatrial shunt.     LEFT VENTRICLE  NST: 07/2019  There was no ST segment deviation noted during stress.  Defect 1: There is a medium defect of moderate severity present in the mid inferoseptal, mid inferior, mid inferolateral, apical inferior and apical lateral location. There is significant soft tissue attenuation. There may be a degree of myocardial scar but there are no large ischemic territories.  This is a low risk study.  Nuclear stress EF: 52%.   Echocardiogram: 07/2019 IMPRESSIONS      1. Left ventricular ejection fraction, by visual estimation, is 60 to 65%. The left ventricle has normal function. There is mildly increased left ventricular hypertrophy.  2. Left ventricular diastolic Doppler parameters are consistent with impaired relaxation pattern of LV diastolic filling.  3. Global right ventricle has low normal systolic function.The right ventricular size is normal. No increase in right ventricular wall thickness.  4. Left atrial size was mildly dilated.  5. Right atrial size was mildly dilated.  6. Mild mitral annular calcification.  7. Moderate aortic valve annular calcification.  8. The mitral valve is degenerative. Moderate mitral valve regurgitation.  9. The tricuspid valve is grossly normal. Tricuspid valve regurgitation is mild. 10. The aortic valve is tricuspid Aortic valve regurgitation is mild by color flow Doppler. Moderate to severe aortic valve stenosis. 11. The pulmonic valve was not well visualized. Pulmonic valve regurgitation is not visualized by color flow Doppler. 12. Mildly elevated pulmonary artery systolic pressure. 13. The inferior  vena cava is dilated in size with >50% respiratory variability, suggesting right atrial pressure of 8 mmHg.       Laboratory Data:  High Sensitivity Troponin:   Recent Labs  Lab 06/28/20 0131 06/28/20 0324 07/02/20 1628 07/02/20 1828 07/12/20 0516  TROPONINIHS 50* 75* 21* 18* 55*     Chemistry Recent Labs  Lab 07/12/20 0516  NA 139  K 4.2  CL 105  CO2 25  GLUCOSE 109*  BUN 19  CREATININE 1.26*  CALCIUM 9.2  GFRNONAA 49*  GFRAA 57*  ANIONGAP 9    Recent Labs  Lab 07/12/20 0516  PROT 6.0*  ALBUMIN 3.2*  AST 13*  ALT 12  ALKPHOS 51  BILITOT 0.9   Hematology Recent Labs  Lab 07/12/20 0516  WBC 9.6  RBC 4.04*  HGB 12.7*  HCT 38.9*  MCV 96.3  MCH 31.4  MCHC 32.6  RDW 13.2  PLT 202   BNP Recent Labs  Lab 07/12/20 0516  BNP 1,362.0*    DDimer No results for input(s): DDIMER in the last 168 hours.   Radiology/Studies:  DG Chest Port 1 View  Result Date: 07/12/2020 CLINICAL DATA:  Shortness of breath.  PND. EXAM: PORTABLE CHEST 1 VIEW COMPARISON:  07/02/2020 FINDINGS: Diffuse interstitial opacity with cephalized blood flow and asymmetric right upper lobe density. Chronic cardiomegaly. Small pleural effusions are likely. No pneumothorax. IMPRESSION: CHF pattern. Electronically Signed   By: Marnee SpringJonathon  Watts M.D.   On: 07/12/2020 05:13   {    New York Heart Association (NYHA) Functional Class NYHA Class III  Assessment and Plan:   1. Acute on chronic systolic and diastolic CHF secondary to excessive salt intake and stopping meds due to hypotension. Now diuresing after Lasix 60 mg IV x 1. Back on low dose  carvedilol. Hopefully he'll tolerate. Could possibly go home with close f/u with cardiologist in [place]-Dr. Welton Flakes. They were supposed to see him today. 2. Hypotension limiting treatment options. Supposed to wear abdominal binder and TED hose. 3. Mod-Severe AS 4. History of carotid endarterectomy on ASA 5. CKD stage 3 Crt 1.26.       For  questions or updates, please contact [facility] Please consult [email or web address] for contact info under    Signed, Jacolyn Reedy, PA-C  07/12/2020 8:02 AM   Attending note  Patient seen and discussed with PA Geni Bers, I agree with her documentation. 86 yo male history of chronic systolic/diastolic HF, mod to severe AS, prior CEA, CKD 3, admitted with SOB and weakenss  From notes recent admission 06/2020 with HF. During that admission noted LVEF had decreased to 35-40%. Diuresed with improved symptoms. Medical therapy limited by low bp's.   K 4.2 Cr 1.26 BUN 19 BNP 1362 WBC 9.6 Hgb 12.7 Plt 202 TSH 1.095  hstrop 55-> CXR CHF pattern EKG SR, RBBB,LAFB, PVCs COVID neg  06/2020 echo: LVEF 35-40%, grade I diastolic dysfunction, mod to severe AS( mean grad 24, AVA VTI 0.52, DI 0.17 LV SVI 16)  Admitted with acute on chronic combined systolic/diastolic HF. Drop in LVEF is new by 06/2020 echo, he is followed by Dr Welton Flakes in [place]. . Medical therapy was limited during recent admission due to low bp's.  on just low dose coreg 3.125mg  bid and losartan 12.5mg , since discharge family reports primary cardiologist has since stopped both due to low bp's. Reports also stopped taking lasix at home.   Marland Kitchen Received  of IV lasix in ER just this AM, improved symptoms. Will defer any long term workup to Dr Welton Flakes regarding drop in LVEF, given advanced age would defer to consider ischemic testing or not.   06/2020 echo would be consistent with severe low flow low gradient aortic stenosis ( mean grad 24, AVA VTI 0.52, DI 0.17 LV SVI 16). Unclear if contributing to drop in LVEF. Once again with advanced age unclear if should be considered for any form of intervention, I will defer to Dr Welton Flakes who follows this patient regularly.   Patient has close f/u with Dr Welton Flakes, and have asked we defer any medication changes. Would not send out on any beta blocker or ACE/ARB at this time, would d/c with lasix  qod as he had been  on, defer further changes to his f/u   We will sign off inpatient care.  Dina Rich MD

## 2020-07-13 ENCOUNTER — Telehealth: Payer: Self-pay

## 2020-07-13 NOTE — Telephone Encounter (Signed)
Called patient to follow up with him being seen in the emergency room yesterday for heart failure,spoke with wife Kathie Rhodes as patient was on his way to see Dr.Khan.  She states that they had been instructed by Dr. Welton Flakes to stop weighing daily.  They had not been taking the lasix until last Sunday when they called Dr. Rosine Beat PA and were instructed to take one dose of lasix at that time and then repeat in the AM, but he ended up in the ED before that.  He was given lasix in the ED and she states he filled the urinal 4 times and was breathing better so then let him go home.  She states they are going to start weighing daily, she also felt they were not doing as well with the diet as they could have been so they are going to try harder.  Discussed seeing Inetta Fermo in the heart failure clinic -they are going to discuss it and get back to me or Inetta Fermo.

## 2020-07-15 ENCOUNTER — Ambulatory Visit: Payer: No Typology Code available for payment source | Admitting: Student

## 2020-08-10 ENCOUNTER — Ambulatory Visit (INDEPENDENT_AMBULATORY_CARE_PROVIDER_SITE_OTHER): Payer: Medicare Other | Admitting: Orthopaedic Surgery

## 2020-08-10 ENCOUNTER — Encounter: Payer: Self-pay | Admitting: Orthopaedic Surgery

## 2020-08-10 DIAGNOSIS — I6523 Occlusion and stenosis of bilateral carotid arteries: Secondary | ICD-10-CM | POA: Diagnosis not present

## 2020-08-10 DIAGNOSIS — M7061 Trochanteric bursitis, right hip: Secondary | ICD-10-CM

## 2020-08-10 MED ORDER — METHYLPREDNISOLONE ACETATE 40 MG/ML IJ SUSP
40.0000 mg | INTRAMUSCULAR | Status: AC | PRN
  Administered 2020-08-10: 40 mg via INTRA_ARTICULAR

## 2020-08-10 MED ORDER — LIDOCAINE HCL 1 % IJ SOLN
3.0000 mL | INTRAMUSCULAR | Status: AC | PRN
  Administered 2020-08-10: 3 mL

## 2020-08-10 NOTE — Progress Notes (Signed)
Office Visit Note   Patient: Jeffery Smith           Date of Birth: [birth year]           MRN: [id number] Visit Date: 08/10/2020              Requested by: No referring provider defined for this encounter. PCP: System, Provider Not In   Assessment & Plan: Visit Diagnoses:  1. Trochanteric bursitis, right hip     Plan: Per the patient's wish I did provide a steroid injection of the right hip trochanteric area.  This is helped him before.  He tolerated the injection well.  All question concerns were answered and addressed.  Follow-up will be as needed.  Follow-Up Instructions: Return if symptoms worsen or fail to improve.   Orders:  Orders Placed This Encounter  Procedures  . Large Joint Inj   No orders of the defined types were placed in this encounter.     Procedures: Large Joint Inj: R greater trochanter on 08/10/2020 10:14 AM Indications: pain and diagnostic evaluation Details: 22 G 1.5 in needle, lateral approach  Arthrogram: No  Medications: 3 mL lidocaine 1 %; 40 mg methylPREDNISolone acetate 40 MG/ML Outcome: tolerated well, no immediate complications Procedure, treatment alternatives, risks and benefits explained, specific risks discussed. Consent was given by the patient. Immediately prior to procedure a time out was called to verify the correct patient, procedure, equipment, support staff and site/side marked as required. Patient was prepped and draped in the usual sterile fashion.       Clinical Data: No additional findings.   Subjective: Chief Complaint  Patient presents with  . Right Hip - Pain  The patient is a 86 year old gentleman who comes in requesting a steroid injection in his right hip trochanteric area.  This is for hip pain and bursitis.  We last injected him in January of this year and that helped quite a bit.  Recently he has been dealing with pneumonia and congestive heart failure.  He does have a portable defibrillator as well.  His  wife is with him today.  He is not a diabetic.  HPI  Review of Systems Today he does not reporting shortness of breath or chest pain.  He denies any fever, chills, nausea, vomiting  Objective: Vital Signs: There were no vitals taken for this visit.  Physical Exam He is alert and oriented and slightly hard of hearing.  He follows commands appropriately Ortho Exam Examination of his right hip shows it moves smoothly and fluidly.  He is a thin individual.  He has pain over the right greater trochanteric area. Specialty Comments:  No specialty comments available.  Imaging: No results found.   PMFS History: Patient Active Problem List   Diagnosis Date Noted  . Acute exacerbation of CHF (congestive heart failure) (HCC) 07/12/2020  . Hyperlipidemia   . Hard of hearing   . GERD (gastroesophageal reflux disease)   . Elevated brain natriuretic peptide (BNP) level   . Anemia due to chronic kidney disease   . Acute CHF (congestive heart failure) (HCC) 06/28/2020  . Acute respiratory failure with hypoxia (HCC) 06/28/2020  . Acute on chronic diastolic CHF (congestive heart failure) (HCC) 06/28/2020  . Moderate to severe aortic stenosis 06/28/2020  . H/O carotid endarterectomy 06/28/2020  . Elevated troponin 06/28/2020  . Chronic kidney disease, stage 3a (HCC) 06/28/2020  . OA (osteoarthritis) of knee 09/21/2014  . Occlusion and stenosis of carotid artery without mention of  cerebral infarction 11/21/2011   Past Medical History:  Diagnosis Date  . Arthritis   . Benign head tremor   . Carotid artery occlusion    right side   . Dysrhythmia    irregular heart beat per patient- skips a beat   . Enlarged prostate   . GERD (gastroesophageal reflux disease)   . Hard of hearing   . Hyperlipidemia   . Sore throat     Family History  Problem Relation Age of Onset  . Cancer Mother   . Cancer Brother     Past Surgical History:  Procedure Laterality Date  . CATARACT EXTRACTION      left  . ENDARTERECTOMY  11/03/2011   Procedure: ENDARTERECTOMY CAROTID;  Surgeon: Larina Earthly, MD;  Location: [facility] OR;  Service: Vascular;  Laterality: Right;  Right Carotid endarterectomy with patch angiopolasty  . TONSILLECTOMY    . TOTAL KNEE ARTHROPLASTY     left  . TOTAL KNEE ARTHROPLASTY Right 09/21/2014   Procedure: RIGHT TOTAL KNEE ARTHROPLASTY;  Surgeon: Loanne Drilling, MD;  Location: [facility] ORS;  Service: Orthopedics;  Laterality: Right;   Social History   Occupational History  . Not on file  Tobacco Use  . Smoking status: Never Smoker  . Smokeless tobacco: Never Used  Vaping Use  . Vaping Use: Never used  Substance and Sexual Activity  . Alcohol use: Yes    Comment: 1 glass of wine daily  . Drug use: No  . Sexual activity: Not on file

## 2020-08-21 ENCOUNTER — Encounter (HOSPITAL_COMMUNITY): Payer: Self-pay

## 2020-08-21 ENCOUNTER — Other Ambulatory Visit: Payer: Self-pay

## 2020-08-21 ENCOUNTER — Emergency Department (HOSPITAL_COMMUNITY)
Admission: EM | Admit: 2020-08-21 | Discharge: 2020-08-21 | Disposition: A | Discharge status: Home / Self Care | Payer: Medicare Other | Attending: Emergency Medicine | Admitting: Emergency Medicine

## 2020-08-21 ENCOUNTER — Emergency Department (HOSPITAL_COMMUNITY): Payer: Medicare Other

## 2020-08-21 DIAGNOSIS — Z96653 Presence of artificial knee joint, bilateral: Secondary | ICD-10-CM | POA: Diagnosis not present

## 2020-08-21 DIAGNOSIS — Z79899 Other long term (current) drug therapy: Secondary | ICD-10-CM | POA: Insufficient documentation

## 2020-08-21 DIAGNOSIS — Z7982 Long term (current) use of aspirin: Secondary | ICD-10-CM | POA: Insufficient documentation

## 2020-08-21 DIAGNOSIS — K5900 Constipation, unspecified: Secondary | ICD-10-CM | POA: Insufficient documentation

## 2020-08-21 DIAGNOSIS — N1831 Chronic kidney disease, stage 3a: Secondary | ICD-10-CM | POA: Diagnosis not present

## 2020-08-21 DIAGNOSIS — I5033 Acute on chronic diastolic (congestive) heart failure: Secondary | ICD-10-CM | POA: Insufficient documentation

## 2020-08-21 LAB — CBC WITH DIFFERENTIAL/PLATELET
Abs Immature Granulocytes: 0.08 10*3/uL — ABNORMAL HIGH (ref 0.00–0.07)
Basophils Absolute: 0 10*3/uL (ref 0.0–0.1)
Basophils Relative: 0 %
Eosinophils Absolute: 0.1 10*3/uL (ref 0.0–0.5)
Eosinophils Relative: 1 %
HCT: 41 % (ref 39.0–52.0)
Hemoglobin: 13.5 g/dL (ref 13.0–17.0)
Immature Granulocytes: 1 %
Lymphocytes Relative: 4 %
Lymphs Abs: 0.5 10*3/uL — ABNORMAL LOW (ref 0.7–4.0)
MCH: 31.3 pg (ref 26.0–34.0)
MCHC: 32.9 g/dL (ref 30.0–36.0)
MCV: 95.1 fL (ref 80.0–100.0)
Monocytes Absolute: 0.9 10*3/uL (ref 0.1–1.0)
Monocytes Relative: 7 %
Neutro Abs: 10.7 10*3/uL — ABNORMAL HIGH (ref 1.7–7.7)
Neutrophils Relative %: 87 %
Platelets: 315 10*3/uL (ref 150–400)
RBC: 4.31 MIL/uL (ref 4.22–5.81)
RDW: 13.7 % (ref 11.5–15.5)
WBC: 12.4 10*3/uL — ABNORMAL HIGH (ref 4.0–10.5)
nRBC: 0 % (ref 0.0–0.2)

## 2020-08-21 LAB — BASIC METABOLIC PANEL
Anion gap: 10 (ref 5–15)
BUN: 24 mg/dL — ABNORMAL HIGH (ref 8–23)
CO2: 23 mmol/L (ref 22–32)
Calcium: 9.8 mg/dL (ref 8.9–10.3)
Chloride: 100 mmol/L (ref 98–111)
Creatinine, Ser: 1.34 mg/dL — ABNORMAL HIGH (ref 0.61–1.24)
GFR, Estimated: 49 mL/min — ABNORMAL LOW (ref >60)
Glucose, Bld: 118 mg/dL — ABNORMAL HIGH (ref 70–99)
Potassium: 4.3 mmol/L (ref 3.5–5.1)
Sodium: 133 mmol/L — ABNORMAL LOW (ref 135–145)

## 2020-08-21 MED ORDER — SORBITOL 70 % SOLN
960.0000 mL | TOPICAL_OIL | Freq: Once | ORAL | Status: AC
  Administered 2020-08-21: 960 mL via RECTAL
  Filled 2020-08-21: qty 473

## 2020-08-21 NOTE — ED Provider Notes (Signed)
Patient was received at shift change from [facility] PA-C she provided HPI, current work-up, likely disposition please see her note for full detail.  Per her instructions will follow up after enema and reassess patient.  If patient is feeling well and has no abdominal tenderness will DC home on a MiraLAX regimen and follow-up with GI.  If there are any concerning symptoms during reexamination we will proceed with CT abdomen pelvis for further evaluation. Physical Exam  BP 109/70 (BP Location: Right Arm)   Pulse (!) 105   Temp 98 F (36.7 C) (Oral)   Resp 17   Ht 6\' 1"  (1.854 m)   Wt 77.1 kg   SpO2 90%   BMI 22.43 kg/m   Physical Exam Vitals and nursing note reviewed.  Constitutional:      General: He is not in acute distress.    Appearance: He is not ill-appearing.  HENT:     Head: Normocephalic and atraumatic.     Nose: No congestion.  Eyes:     Conjunctiva/sclera: Conjunctivae normal.  Pulmonary:     Effort: Pulmonary effort is normal. No respiratory distress.  Abdominal:     General: There is no distension.     Palpations: Abdomen is soft. There is no mass.     Tenderness: There is no abdominal tenderness. There is no guarding.  Musculoskeletal:     Comments: Patient moving all 4 extremities without difficulty  Skin:    General: Skin is warm and dry.  Neurological:     General: No focal deficit present.     Mental Status: He is alert.  Psychiatric:        Mood and Affect: Mood normal.     ED Course/Procedures     Procedures  MDM  Patient had a enema performed, large amount of stool came out after.  Patient states he is feeling much better and would like to go home.  Assess patient's abdomen it was soft, nontender to palpation no acute abdomen noted.  Reviewed patient's lab work and imaging.  Will recommend MiraLAX for the next 2 weeks, encourage hydration and follow-up with PCP for further evaluation.  Vital signs have remained stable, no indication for  hospital admission.  Patient given at home care as well strict return precautions.  Patient verbalized that they understood agreed to said plan.        , PA-C 08/21/20 2050    08/28/2020, MD 08/21/20 2126

## 2020-08-21 NOTE — ED Notes (Signed)
Pt educated with DC instructions and verbalized complete understanding, denies questions at this time. Pt IV removed fully intact and dressing applied. Pt to exit via wheelchair to be transported by his wife home.

## 2020-08-21 NOTE — Discharge Instructions (Addendum)
You have been evaluated for constipation.  Lab work and imaging all looks reassuring.  I recommend taking MiraLAX for the next 2 weeks,  1 capful a day, please be sure to drink plenty of fluids as you will need to be hydrated for the MiraLAX to work.  Please follow-up with your primary care provider for further evaluation management.  Come back to the emergency department if you develop chest pain, shortness of breath, severe abdominal pain, uncontrolled nausea, vomiting, diarrhea.

## 2020-08-21 NOTE — ED Notes (Signed)
Pt to restroom via wheelchair on O2 to complete enema admin. Pt tolerated well and produced a copious amount of solid stool. Pt states "I dont feel the pressure in my bottom anymore".

## 2020-08-21 NOTE — ED Triage Notes (Signed)
From home. Constipation x 4 days and now has severe pain in abdomen. Straining very hard at home. Tried miralax this morning and then at lunch but no results.

## 2020-08-21 NOTE — ED Provider Notes (Signed)
[facility] EMERGENCY DEPARTMENT Provider Note   CSN: [id number] Arrival date & time: 08/21/20  1606    History Chief Complaint  Patient presents with  . Constipation    Jeffery Smith is a 86 y.o. male with past medical history significant for CHF on LifeVest, GERD, carotid artery stenosis, CKD who presents for evaluation of constipation.  Patient states he has not had a bowel movement in 4 days.  Patient states that he has had rectal pain like he needs to have a bowel movement however has not been able to have one.  He told his wife this morning.  They gave 2 doses of MiraLAX without relief.  Has not passed flatulence.  Denies any prior abdominal surgeries or histories of bowel obstructions.  He denies any abdominal pain however states his pain is at his rectum.  Denies any melena or bright red blood in his rectum.  He is currently on a LifeVest for heart failure due to reduced EF.  He denies any fever, chills, nausea, vomiting, chest pain, shortness of breath, dysuria, hematuria, weakness.  Denies additional aggravating or alleviating factors.  Rates pain a 6/10.  History of obtained from patient and past medical records.  No interpreter used.  HPI     Past Medical History:  Diagnosis Date  . Arthritis   . Benign head tremor   . Carotid artery occlusion    right side   . Dysrhythmia    irregular heart beat per patient- skips a beat   . Enlarged prostate   . GERD (gastroesophageal reflux disease)   . Hard of hearing   . Hyperlipidemia   . Sore throat     Patient Active Problem List   Diagnosis Date Noted  . Acute exacerbation of CHF (congestive heart failure) (HCC) 07/12/2020  . Hyperlipidemia   . Hard of hearing   . GERD (gastroesophageal reflux disease)   . Elevated brain natriuretic peptide (BNP) level   . Anemia due to chronic kidney disease   . Acute CHF (congestive heart failure) (HCC) 06/28/2020  . Acute respiratory failure with hypoxia (HCC) 06/28/2020    . Acute on chronic diastolic CHF (congestive heart failure) (HCC) 06/28/2020  . Moderate to severe aortic stenosis 06/28/2020  . H/O carotid endarterectomy 06/28/2020  . Elevated troponin 06/28/2020  . Chronic kidney disease, stage 3a (HCC) 06/28/2020  . OA (osteoarthritis) of knee 09/21/2014  . Occlusion and stenosis of carotid artery without mention of cerebral infarction 11/21/2011    Past Surgical History:  Procedure Laterality Date  . CATARACT EXTRACTION     left  . ENDARTERECTOMY  11/03/2011   Procedure: ENDARTERECTOMY CAROTID;  Surgeon: Larina Earthly, MD;  Location: [facility] OR;  Service: Vascular;  Laterality: Right;  Right Carotid endarterectomy with patch angiopolasty  . TONSILLECTOMY    . TOTAL KNEE ARTHROPLASTY     left  . TOTAL KNEE ARTHROPLASTY Right 09/21/2014   Procedure: RIGHT TOTAL KNEE ARTHROPLASTY;  Surgeon: Loanne Drilling, MD;  Location: [facility] ORS;  Service: Orthopedics;  Laterality: Right;       Family History  Problem Relation Age of Onset  . Cancer Mother   . Cancer Brother     Social History   Tobacco Use  . Smoking status: Never Smoker  . Smokeless tobacco: Never Used  Vaping Use  . Vaping Use: Never used  Substance Use Topics  . Alcohol use: Yes    Comment: 1 glass of wine daily  . Drug use: No  Home Medications Prior to Admission medications   Medication Sig Start Date End Date Taking? Authorizing Provider  acetaminophen (TYLENOL) 325 MG tablet Take 650 mg by mouth every 6 (six) hours as needed for mild pain, fever or headache.    [provider]  Apoaequorin (PREVAGEN EXTRA STRENGTH PO) Take 1 tablet by mouth See admin instructions. Chew 1 tablet by mouth every evening    [provider]  aspirin EC 81 MG tablet Take 1 tablet (81 mg total) by mouth daily. 10/07/19   Strader, Lennart PallBrittany M, PA-C  cholecalciferol (VITAMIN D3) 25 MCG (1000 UNIT) tablet Take 1,000 Units by mouth daily.    [provider]  fluticasone  (FLONASE) 50 MCG/ACT nasal spray Place 2 sprays into both nostrils daily.    [provider]  furosemide (LASIX) 20 MG tablet Take 1 tablet (20 mg total) by mouth every other day. 07/13/20 10/11/20  Johnson, Clanford L, MD  ipratropium (ATROVENT) 0.06 % nasal spray Place 2 sprays into both nostrils at bedtime.    [provider]  loratadine (CLARITIN) 10 MG tablet Take 10 mg by mouth daily.    [provider]  omeprazole (PRILOSEC) 20 MG capsule Take 20 mg by mouth at bedtime.     [provider]    Allergies    Horse-derived products  Review of Systems   Review of Systems  Constitutional: Negative.   HENT: Negative.   Respiratory: Negative.   Cardiovascular: Negative.   Gastrointestinal: Positive for constipation. Negative for abdominal distention, abdominal pain, anal bleeding, blood in stool, diarrhea, nausea, rectal pain and vomiting.  Genitourinary: Negative.   Musculoskeletal: Negative.   Skin: Negative.   Neurological: Negative.   All other systems reviewed and are negative.   Physical Exam Updated Vital Signs BP 102/67   Pulse (!) 103   Temp 98 F (36.7 C) (Oral)   Resp 20   Ht 6\' 1"  (1.854 m)   Wt 77.1 kg   SpO2 93%   BMI 22.43 kg/m   Physical Exam Vitals and nursing note reviewed. Exam conducted with a chaperone present.  Constitutional:      General: He is not in acute distress.    Appearance: Normal appearance. He is well-developed. He is not ill-appearing, toxic-appearing or diaphoretic.  HENT:     Head: Normocephalic and atraumatic.     Nose: Nose normal.     Mouth/Throat:     Mouth: Mucous membranes are moist.  Eyes:     Pupils: Pupils are equal, round, and reactive to light.  Cardiovascular:     Rate and Rhythm: Normal rate and regular rhythm.     Pulses: Normal pulses.     Heart sounds: Normal heart sounds.  Pulmonary:     Effort: Pulmonary effort is normal. No respiratory distress.     Breath sounds: Normal  breath sounds.  Chest:     Comments: Life Vest in place Abdominal:     General: Bowel sounds are normal. There is no distension.     Palpations: Abdomen is soft. There is no mass.     Tenderness: There is no abdominal tenderness. There is no right CVA tenderness, left CVA tenderness, guarding or rebound.     Hernia: No hernia is present.  Genitourinary:    Penis: Normal.      Testes: Normal.     Comments: Oozing light brown stool from rectal vault. Impaction to distal finger tip length rectum Musculoskeletal:  General: No swelling, tenderness, deformity or signs of injury. Normal range of motion.     Cervical back: Normal range of motion and neck supple.     Right lower leg: No edema.     Left lower leg: No edema.  Skin:    General: Skin is warm and dry.     Capillary Refill: Capillary refill takes less than 2 seconds.  Neurological:     General: No focal deficit present.     Mental Status: He is alert and oriented to person, place, and time.    ED Results / Procedures / Treatments   Labs (all labs ordered are listed, but only abnormal results are displayed) Labs Reviewed  CBC WITH DIFFERENTIAL/PLATELET - Abnormal; Notable for the following components:      Result Value   WBC 12.4 (*)    Neutro Abs 10.7 (*)    Lymphs Abs 0.5 (*)    Abs Immature Granulocytes 0.08 (*)    All other components within normal limits  BASIC METABOLIC PANEL - Abnormal; Notable for the following components:   Sodium 133 (*)    Glucose, Bld 118 (*)    BUN 24 (*)    Creatinine, Ser 1.34 (*)    GFR, Estimated 49 (*)    All other components within normal limits    EKG None  Radiology DG Abd Portable 1 View  Result Date: 08/21/2020 CLINICAL DATA:  Constipation EXAM: PORTABLE ABDOMEN - 1 VIEW COMPARISON:  July 02, 2020. FINDINGS: Air and stool-filled nondilated loops of bowel. Moderate colonic stool burden predominately in the LEFT hemicolon. Mild distension of loops of bowel in the  RIGHT hemiabdomen. Reticulation of bilateral lung bases likely reflecting underlying interstitial lung disease versus pulmonary edema. Degenerative changes of the lumbar spine. Prominent bladder silhouette. IMPRESSION: 1. Moderate colonic stool burden predominately in the LEFT hemicolon. 2. Nonobstructive bowel gas pattern. 3. Reticulation of bilateral lung bases likely reflecting underlying interstitial lung disease versus pulmonary edema. Electronically Signed   By: Meda Klinefelter MD   On: 08/21/2020 17:56    Procedures Fecal disimpaction  Date/Time: 08/21/2020 6:37 PM Performed by: Linwood Dibbles, PA-C Authorized by: Linwood Dibbles, PA-C  Preparation: Patient was prepped and draped in the usual sterile fashion. Local anesthesia used: no  Anesthesia: Local anesthesia used: no  Sedation: Patient sedated: no  Patient tolerance: patient tolerated the procedure well with no immediate complications    (including critical care time)  Medications Ordered in ED Medications  sorbitol, milk of mag, mineral oil, glycerin (SMOG) enema (has no administration in time range)    ED Course  I have reviewed the triage vital signs and the nursing notes.  Pertinent labs & imaging results that were available during my care of the patient were reviewed by me and considered in my medical decision making (see chart for details).  86 year old presents for evaluation of constipation.  He is afebrile, septic, non-ill-appearing.  He is not passing flatulence has not had a bowel movement in 4 days.  He denies any abdominal pain.  He does have LifeVest in place due to CHF with decreased EF.  He denies any chest pain, shortness of breath lower extremity edema.  No midline pulsatile abdominal mass.  Rectal exam with some light brown stool oozing from rectal vault however there is distal impaction in the rectum with just barely able to to palpate this with distal fingertip.  We will plan on labs, DG  abdomen, enema.  Labs and  imaging personally reviewed and interpreted:  CBC mild leukocytosis at 12.4 BMP sodium 133, creatinine 1.34, BUN 24, labs at baseline DG Abdomen moderate stool EKG   Patient reassessed.  Discussed labs and imaging.  Will plan on enema here in ED and reassess to determine if need for further imaging.  Care transferred to [facility] [place] who will follow up on patient and determine disposition.    MDM Rules/Calculators/A&P                           Final Clinical Impression(s) / ED Diagnoses Final diagnoses:  Constipation, unspecified constipation type    Rx / DC Orders ED Discharge Orders    None       Robbye Dede A, PA-C 08/21/20 1837    Cathren Laine, MD 08/21/20 2126

## 2020-08-23 ENCOUNTER — Emergency Department: Payer: Medicare Other

## 2020-08-23 ENCOUNTER — Inpatient Hospital Stay
Admission: EM | Admit: 2020-08-23 | Discharge: 2020-09-08 | DRG: 871 | Disposition: E | Payer: Medicare Other | Attending: Pulmonary Disease | Admitting: Pulmonary Disease

## 2020-08-23 ENCOUNTER — Other Ambulatory Visit: Payer: Self-pay

## 2020-08-23 ENCOUNTER — Inpatient Hospital Stay: Payer: Medicare Other

## 2020-08-23 ENCOUNTER — Encounter: Payer: Self-pay | Admitting: Emergency Medicine

## 2020-08-23 DIAGNOSIS — N400 Enlarged prostate without lower urinary tract symptoms: Secondary | ICD-10-CM | POA: Diagnosis present

## 2020-08-23 DIAGNOSIS — N179 Acute kidney failure, unspecified: Secondary | ICD-10-CM | POA: Diagnosis present

## 2020-08-23 DIAGNOSIS — D72823 Leukemoid reaction: Secondary | ICD-10-CM | POA: Diagnosis present

## 2020-08-23 DIAGNOSIS — K633 Ulcer of intestine: Secondary | ICD-10-CM | POA: Diagnosis present

## 2020-08-23 DIAGNOSIS — E860 Dehydration: Secondary | ICD-10-CM | POA: Diagnosis present

## 2020-08-23 DIAGNOSIS — K626 Ulcer of anus and rectum: Secondary | ICD-10-CM | POA: Diagnosis not present

## 2020-08-23 DIAGNOSIS — K219 Gastro-esophageal reflux disease without esophagitis: Secondary | ICD-10-CM | POA: Diagnosis present

## 2020-08-23 DIAGNOSIS — R627 Adult failure to thrive: Secondary | ICD-10-CM

## 2020-08-23 DIAGNOSIS — I4510 Unspecified right bundle-branch block: Secondary | ICD-10-CM | POA: Diagnosis present

## 2020-08-23 DIAGNOSIS — I420 Dilated cardiomyopathy: Secondary | ICD-10-CM | POA: Diagnosis present

## 2020-08-23 DIAGNOSIS — I2510 Atherosclerotic heart disease of native coronary artery without angina pectoris: Secondary | ICD-10-CM | POA: Diagnosis present

## 2020-08-23 DIAGNOSIS — R6521 Severe sepsis with septic shock: Secondary | ICD-10-CM | POA: Diagnosis present

## 2020-08-23 DIAGNOSIS — Y92239 Unspecified place in hospital as the place of occurrence of the external cause: Secondary | ICD-10-CM | POA: Diagnosis present

## 2020-08-23 DIAGNOSIS — Z9181 History of falling: Secondary | ICD-10-CM

## 2020-08-23 DIAGNOSIS — Z79899 Other long term (current) drug therapy: Secondary | ICD-10-CM

## 2020-08-23 DIAGNOSIS — H9190 Unspecified hearing loss, unspecified ear: Secondary | ICD-10-CM | POA: Diagnosis present

## 2020-08-23 DIAGNOSIS — Z20822 Contact with and (suspected) exposure to covid-19: Secondary | ICD-10-CM | POA: Diagnosis present

## 2020-08-23 DIAGNOSIS — Z515 Encounter for palliative care: Secondary | ICD-10-CM

## 2020-08-23 DIAGNOSIS — E876 Hypokalemia: Secondary | ICD-10-CM | POA: Diagnosis not present

## 2020-08-23 DIAGNOSIS — I6521 Occlusion and stenosis of right carotid artery: Secondary | ICD-10-CM | POA: Diagnosis present

## 2020-08-23 DIAGNOSIS — Z7982 Long term (current) use of aspirin: Secondary | ICD-10-CM

## 2020-08-23 DIAGNOSIS — I5023 Acute on chronic systolic (congestive) heart failure: Secondary | ICD-10-CM | POA: Diagnosis not present

## 2020-08-23 DIAGNOSIS — J9601 Acute respiratory failure with hypoxia: Secondary | ICD-10-CM

## 2020-08-23 DIAGNOSIS — R531 Weakness: Secondary | ICD-10-CM

## 2020-08-23 DIAGNOSIS — K8020 Calculus of gallbladder without cholecystitis without obstruction: Secondary | ICD-10-CM | POA: Diagnosis present

## 2020-08-23 DIAGNOSIS — I088 Other rheumatic multiple valve diseases: Secondary | ICD-10-CM | POA: Diagnosis present

## 2020-08-23 DIAGNOSIS — R042 Hemoptysis: Secondary | ICD-10-CM | POA: Diagnosis not present

## 2020-08-23 DIAGNOSIS — I5043 Acute on chronic combined systolic (congestive) and diastolic (congestive) heart failure: Secondary | ICD-10-CM

## 2020-08-23 DIAGNOSIS — Z6820 Body mass index (BMI) 20.0-20.9, adult: Secondary | ICD-10-CM

## 2020-08-23 DIAGNOSIS — E43 Unspecified severe protein-calorie malnutrition: Secondary | ICD-10-CM | POA: Insufficient documentation

## 2020-08-23 DIAGNOSIS — R410 Disorientation, unspecified: Secondary | ICD-10-CM | POA: Diagnosis not present

## 2020-08-23 DIAGNOSIS — J9600 Acute respiratory failure, unspecified whether with hypoxia or hypercapnia: Secondary | ICD-10-CM

## 2020-08-23 DIAGNOSIS — N1831 Chronic kidney disease, stage 3a: Secondary | ICD-10-CM | POA: Diagnosis present

## 2020-08-23 DIAGNOSIS — Z7189 Other specified counseling: Secondary | ICD-10-CM

## 2020-08-23 DIAGNOSIS — Z96651 Presence of right artificial knee joint: Secondary | ICD-10-CM | POA: Diagnosis present

## 2020-08-23 DIAGNOSIS — J690 Pneumonitis due to inhalation of food and vomit: Secondary | ICD-10-CM

## 2020-08-23 DIAGNOSIS — E871 Hypo-osmolality and hyponatremia: Secondary | ICD-10-CM | POA: Diagnosis present

## 2020-08-23 DIAGNOSIS — J189 Pneumonia, unspecified organism: Secondary | ICD-10-CM | POA: Diagnosis not present

## 2020-08-23 DIAGNOSIS — E785 Hyperlipidemia, unspecified: Secondary | ICD-10-CM | POA: Diagnosis present

## 2020-08-23 DIAGNOSIS — I952 Hypotension due to drugs: Secondary | ICD-10-CM | POA: Diagnosis present

## 2020-08-23 DIAGNOSIS — K5641 Fecal impaction: Secondary | ICD-10-CM | POA: Diagnosis present

## 2020-08-23 DIAGNOSIS — K625 Hemorrhage of anus and rectum: Secondary | ICD-10-CM | POA: Diagnosis present

## 2020-08-23 DIAGNOSIS — I4891 Unspecified atrial fibrillation: Secondary | ICD-10-CM | POA: Diagnosis not present

## 2020-08-23 DIAGNOSIS — D513 Other dietary vitamin B12 deficiency anemia: Secondary | ICD-10-CM | POA: Diagnosis present

## 2020-08-23 DIAGNOSIS — J9691 Respiratory failure, unspecified with hypoxia: Secondary | ICD-10-CM | POA: Diagnosis present

## 2020-08-23 DIAGNOSIS — Z96653 Presence of artificial knee joint, bilateral: Secondary | ICD-10-CM | POA: Diagnosis present

## 2020-08-23 DIAGNOSIS — Z66 Do not resuscitate: Secondary | ICD-10-CM | POA: Diagnosis not present

## 2020-08-23 DIAGNOSIS — B59 Pneumocystosis: Secondary | ICD-10-CM | POA: Diagnosis present

## 2020-08-23 DIAGNOSIS — I5022 Chronic systolic (congestive) heart failure: Secondary | ICD-10-CM | POA: Diagnosis not present

## 2020-08-23 DIAGNOSIS — A419 Sepsis, unspecified organism: Secondary | ICD-10-CM | POA: Diagnosis present

## 2020-08-23 DIAGNOSIS — T502X5A Adverse effect of carbonic-anhydrase inhibitors, benzothiadiazides and other diuretics, initial encounter: Secondary | ICD-10-CM | POA: Diagnosis present

## 2020-08-23 DIAGNOSIS — Z9842 Cataract extraction status, left eye: Secondary | ICD-10-CM

## 2020-08-23 LAB — CBC
HCT: 41.6 % (ref 39.0–52.0)
Hemoglobin: 14 g/dL (ref 13.0–17.0)
MCH: 31 pg (ref 26.0–34.0)
MCHC: 33.7 g/dL (ref 30.0–36.0)
MCV: 92.2 fL (ref 80.0–100.0)
Platelets: 377 10*3/uL (ref 150–400)
RBC: 4.51 MIL/uL (ref 4.22–5.81)
RDW: 13.8 % (ref 11.5–15.5)
WBC: 36.1 10*3/uL — ABNORMAL HIGH (ref 4.0–10.5)
nRBC: 0 % (ref 0.0–0.2)

## 2020-08-23 LAB — BLOOD GAS, ARTERIAL
Acid-base deficit: 1.1 mmol/L (ref 0.0–2.0)
Bicarbonate: 22.2 mmol/L (ref 20.0–28.0)
FIO2: 1
O2 Saturation: 96 %
Patient temperature: 37
pCO2 arterial: 32 mmHg (ref 32.0–48.0)
pH, Arterial: 7.45 (ref 7.350–7.450)
pO2, Arterial: 78 mmHg — ABNORMAL LOW (ref 83.0–108.0)

## 2020-08-23 LAB — COMPREHENSIVE METABOLIC PANEL
ALT: 18 U/L (ref 0–44)
AST: 37 U/L (ref 15–41)
Albumin: 2.6 g/dL — ABNORMAL LOW (ref 3.5–5.0)
Alkaline Phosphatase: 81 U/L (ref 38–126)
Anion gap: 14 (ref 5–15)
BUN: 39 mg/dL — ABNORMAL HIGH (ref 8–23)
CO2: 22 mmol/L (ref 22–32)
Calcium: 10 mg/dL (ref 8.9–10.3)
Chloride: 97 mmol/L — ABNORMAL LOW (ref 98–111)
Creatinine, Ser: 1.5 mg/dL — ABNORMAL HIGH (ref 0.61–1.24)
GFR, Estimated: 43 mL/min — ABNORMAL LOW (ref >60)
Glucose, Bld: 114 mg/dL — ABNORMAL HIGH (ref 70–99)
Potassium: 5.1 mmol/L (ref 3.5–5.1)
Sodium: 133 mmol/L — ABNORMAL LOW (ref 135–145)
Total Bilirubin: 1.3 mg/dL — ABNORMAL HIGH (ref 0.3–1.2)
Total Protein: 6.3 g/dL — ABNORMAL LOW (ref 6.5–8.1)

## 2020-08-23 LAB — RESPIRATORY PANEL BY RT PCR (FLU A&B, COVID)
Influenza A by PCR: NEGATIVE
Influenza B by PCR: NEGATIVE
SARS Coronavirus 2 by RT PCR: NEGATIVE

## 2020-08-23 LAB — FERRITIN: Ferritin: 386 ng/mL — ABNORMAL HIGH (ref 24–336)

## 2020-08-23 LAB — RETICULOCYTES
Immature Retic Fract: 15.6 % (ref 2.3–15.9)
RBC.: 3.76 MIL/uL — ABNORMAL LOW (ref 4.22–5.81)
Retic Count, Absolute: 81.2 10*3/uL (ref 19.0–186.0)
Retic Ct Pct: 2.2 % (ref 0.4–3.1)

## 2020-08-23 LAB — CBC WITH DIFFERENTIAL/PLATELET
Abs Immature Granulocytes: 0.71 10*3/uL — ABNORMAL HIGH (ref 0.00–0.07)
Basophils Absolute: 0.1 10*3/uL (ref 0.0–0.1)
Basophils Relative: 0 %
Eosinophils Absolute: 0 10*3/uL (ref 0.0–0.5)
Eosinophils Relative: 0 %
HCT: 41.7 % (ref 39.0–52.0)
Hemoglobin: 13.8 g/dL (ref 13.0–17.0)
Immature Granulocytes: 2 %
Lymphocytes Relative: 2 %
Lymphs Abs: 0.8 10*3/uL (ref 0.7–4.0)
MCH: 30.7 pg (ref 26.0–34.0)
MCHC: 33.1 g/dL (ref 30.0–36.0)
MCV: 92.9 fL (ref 80.0–100.0)
Monocytes Absolute: 1.8 10*3/uL — ABNORMAL HIGH (ref 0.1–1.0)
Monocytes Relative: 5 %
Neutro Abs: 33 10*3/uL — ABNORMAL HIGH (ref 1.7–7.7)
Neutrophils Relative %: 91 %
Platelets: 372 10*3/uL (ref 150–400)
RBC: 4.49 MIL/uL (ref 4.22–5.81)
RDW: 13.8 % (ref 11.5–15.5)
Smear Review: NORMAL
WBC: 36.3 10*3/uL — ABNORMAL HIGH (ref 4.0–10.5)
nRBC: 0.1 % (ref 0.0–0.2)

## 2020-08-23 LAB — PROTIME-INR
INR: 1.1 (ref 0.8–1.2)
Prothrombin Time: 13.7 seconds (ref 11.4–15.2)

## 2020-08-23 LAB — MRSA PCR SCREENING: MRSA by PCR: NEGATIVE

## 2020-08-23 LAB — TYPE AND SCREEN
ABO/RH(D): A POS
Antibody Screen: NEGATIVE

## 2020-08-23 LAB — PROCALCITONIN: Procalcitonin: 0.53 ng/mL

## 2020-08-23 LAB — IRON AND TIBC
Iron: 24 ug/dL — ABNORMAL LOW (ref 45–182)
Saturation Ratios: 17 % — ABNORMAL LOW (ref 17.9–39.5)
TIBC: 146 ug/dL — ABNORMAL LOW (ref 250–450)
UIBC: 122 ug/dL

## 2020-08-23 LAB — T4, FREE: Free T4: 1.31 ng/dL — ABNORMAL HIGH (ref 0.61–1.12)

## 2020-08-23 LAB — FOLATE: Folate: 8.4 ng/mL (ref >5.9)

## 2020-08-23 LAB — LACTIC ACID, PLASMA: Lactic Acid, Venous: 1.6 mmol/L (ref 0.5–1.9)

## 2020-08-23 LAB — APTT: aPTT: 28 s (ref 24–36)

## 2020-08-23 MED ORDER — SODIUM CHLORIDE 0.9 % IV SOLN
2.0000 g | INTRAVENOUS | Status: DC
  Filled 2020-08-23: qty 20

## 2020-08-23 MED ORDER — SODIUM CHLORIDE 0.9% FLUSH: 3.0000 mL | INTRAVENOUS | Status: DC | PRN

## 2020-08-23 MED ORDER — NOREPINEPHRINE 4 MG/250ML-% IV SOLN
2.0000 ug/min | INTRAVENOUS | Status: DC
  Administered 2020-08-23: 2 ug/min via INTRAVENOUS
  Administered 2020-08-24: 3 ug/min via INTRAVENOUS
  Filled 2020-08-23 (×3): qty 250

## 2020-08-23 MED ORDER — CHLORHEXIDINE GLUCONATE CLOTH 2 % EX PADS
6.0000 | MEDICATED_PAD | Freq: Every day | CUTANEOUS | Status: DC
  Administered 2020-08-23 – 2020-08-29 (×6): 6 via TOPICAL
  Filled 2020-08-23: qty 6

## 2020-08-23 MED ORDER — IOHEXOL 300 MG/ML  SOLN
75.0000 mL | Freq: Once | INTRAMUSCULAR | Status: AC | PRN
  Administered 2020-08-23: 75 mL via INTRAVENOUS

## 2020-08-23 MED ORDER — FLUTICASONE PROPIONATE 50 MCG/ACT NA SUSP
2.0000 | Freq: Every day | NASAL | Status: DC
  Administered 2020-08-24 – 2020-08-29 (×7): 2 via NASAL
  Filled 2020-08-23 (×2): qty 16

## 2020-08-23 MED ORDER — VITAMIN D 25 MCG (1000 UNIT) PO TABS: 1000.0000 [IU] | ORAL_TABLET | Freq: Every day | ORAL | Status: DC

## 2020-08-23 MED ORDER — LORATADINE 10 MG PO TABS: 10.0000 mg | ORAL_TABLET | Freq: Every day | ORAL | Status: DC

## 2020-08-23 MED ORDER — SODIUM CHLORIDE 0.9 % IV SOLN
3.0000 g | Freq: Four times a day (QID) | INTRAVENOUS | Status: DC
  Administered 2020-08-23 – 2020-08-25 (×7): 3 g via INTRAVENOUS
  Filled 2020-08-23 (×3): qty 3
  Filled 2020-08-23: qty 8
  Filled 2020-08-23 (×2): qty 3
  Filled 2020-08-23: qty 8
  Filled 2020-08-23: qty 3
  Filled 2020-08-23: qty 8

## 2020-08-23 MED ORDER — SODIUM CHLORIDE 0.9 % IV SOLN
500.0000 mg | INTRAVENOUS | Status: DC
  Administered 2020-08-23: 500 mg via INTRAVENOUS
  Filled 2020-08-23 (×2): qty 500

## 2020-08-23 MED ORDER — SODIUM CHLORIDE 0.9 % IV SOLN: 250.0000 mL | INTRAVENOUS | Status: DC | PRN

## 2020-08-23 MED ORDER — SODIUM CHLORIDE 0.9 % IV BOLUS
1000.0000 mL | Freq: Once | INTRAVENOUS | Status: AC
  Administered 2020-08-23: 1000 mL via INTRAVENOUS

## 2020-08-23 MED ORDER — SODIUM CHLORIDE 0.9 % IV SOLN: 250.0000 mL | INTRAVENOUS | Status: DC

## 2020-08-23 MED ORDER — VANCOMYCIN HCL IN DEXTROSE 1-5 GM/200ML-% IV SOLN
1000.0000 mg | Freq: Once | INTRAVENOUS | Status: AC
  Administered 2020-08-23: 1000 mg via INTRAVENOUS
  Filled 2020-08-23: qty 200

## 2020-08-23 MED ORDER — ACETAMINOPHEN 325 MG PO TABS
650.0000 mg | ORAL_TABLET | Freq: Four times a day (QID) | ORAL | Status: DC | PRN
  Filled 2020-08-23: qty 2

## 2020-08-23 MED ORDER — PANTOPRAZOLE SODIUM 40 MG PO TBEC: 40.0000 mg | DELAYED_RELEASE_TABLET | Freq: Every day | ORAL | Status: DC

## 2020-08-23 MED ORDER — IPRATROPIUM BROMIDE 0.06 % NA SOLN
2.0000 | Freq: Every day | NASAL | Status: DC
  Administered 2020-08-24 – 2020-08-28 (×5): 2 via NASAL
  Filled 2020-08-23 (×2): qty 15

## 2020-08-23 MED ORDER — SODIUM CHLORIDE 0.9% FLUSH
3.0000 mL | Freq: Two times a day (BID) | INTRAVENOUS | Status: DC
  Administered 2020-08-23 – 2020-08-28 (×10): 3 mL via INTRAVENOUS

## 2020-08-23 MED ORDER — PIPERACILLIN-TAZOBACTAM 3.375 G IVPB
3.3750 g | Freq: Once | INTRAVENOUS | Status: AC
  Administered 2020-08-23: 3.375 g via INTRAVENOUS
  Filled 2020-08-23: qty 50

## 2020-08-23 MED ORDER — PANTOPRAZOLE SODIUM 40 MG IV SOLR
40.0000 mg | Freq: Two times a day (BID) | INTRAVENOUS | Status: DC
  Administered 2020-08-23 – 2020-08-26 (×6): 40 mg via INTRAVENOUS
  Filled 2020-08-23 (×6): qty 40

## 2020-08-23 MED ORDER — FUROSEMIDE 40 MG PO TABS: 20.0000 mg | ORAL_TABLET | ORAL | Status: DC

## 2020-08-23 MED ORDER — SODIUM CHLORIDE 0.9 % IV SOLN
2.0000 g | Freq: Once | INTRAVENOUS | Status: AC
  Administered 2020-08-23: 2 g via INTRAVENOUS
  Filled 2020-08-23: qty 2

## 2020-08-23 MED ORDER — IPRATROPIUM-ALBUTEROL 0.5-2.5 (3) MG/3ML IN SOLN
3.0000 mL | Freq: Four times a day (QID) | RESPIRATORY_TRACT | Status: DC | PRN
  Administered 2020-08-23: 3 mL via RESPIRATORY_TRACT
  Filled 2020-08-23: qty 3

## 2020-08-23 NOTE — Assessment & Plan Note (Signed)
We will hold metoprolol and lasix and enalapril due to hypotension.  Cardiology dr. Santo Held consulted.

## 2020-08-23 NOTE — Sepsis Progress Note (Signed)
Notified provider of need to order additional fluid bolus. Provider did not feel that additional fluids were appropriate given aortic stenosis and chf. Low SBP's are baseline for Jeffery Smith.

## 2020-08-23 NOTE — ED Notes (Signed)
This RN to bedside. Pt cardiac monitor alarming. O2 sat were 85% on 4L [place]. Placed on 6L [place] O2 sat increased to 88-89%. Repositioned pt to sit up. Gave pt breathing treatment at this time. Jonny Ruiz, primary RN made aware.

## 2020-08-23 NOTE — Assessment & Plan Note (Signed)
Pt meets septic shock criteria with hypovolemia due to sepsis from his pna.

## 2020-08-23 NOTE — Assessment & Plan Note (Signed)
Pt on uv ppi as he cannot take po until swallow eval due to suspected aspiration. Anemia panel and type and screen.

## 2020-08-23 NOTE — Consult Note (Signed)
Pharmacy Antibiotic Note  Jeffery Smith is a 86 y.o. male admitted on 09/14/20 with pneumonia.  Pharmacy has been consulted for Unasyn dosing.  Plan: Unasyn 3g q6H  Height: 6\' 1"  (185.4 cm) Weight: 72.1 kg (159 lb) IBW/kg (Calculated) : 79.9  Temp (24hrs), Avg:98.5 F (36.9 C), Min:97.9 F (36.6 C), Max:98.9 F (37.2 C)  Recent Labs  Lab 08/21/20 1706 09-14-20 1115 September 14, 2020 1200  WBC 12.4* 36.3*   36.1*  --   CREATININE 1.34* 1.50*  --   LATICACIDVEN  --   --  1.6    Estimated Creatinine Clearance: 31.4 mL/min (A) (by C-G formula based on SCr of 1.5 mg/dL (H)).    Allergies  Allergen Reactions   Horse-Derived Products Anaphylaxis    Antimicrobials this admission: Cefepime, pip/tazo, vancomycin x 1 11/15 Unaysn >>   Dose adjustments this admission: None  Microbiology results: 11/15 BCx: pending  Thank you for allowing pharmacy to be a part of this patients care.  12/15, PharmD, BCPS Sep 14, 2020 9:48 PM

## 2020-08-23 NOTE — Assessment & Plan Note (Signed)
SpO2: 96 % O2 Flow Rate (L/min): 15 L/min  ABG on Current o2 of 15 L/Min Arterial Blood Gas result:  pO2 78 ; pCO2 32; pH7.45 ;  HCO3 22.2, %O2 96. Attribute to pt's pneumonia. Pt s/p 4 iv abx. Anti-infectives (From admission, onward)   Start     Dose/Rate Route Frequency Ordered Stop   08/24/20 0130  cefTRIAXone (ROCEPHIN) 2 g in sodium chloride 0.9 % 100 mL IVPB        2 g 200 mL/hr over 30 Minutes Intravenous Every 24 hours 09/05/2020 1444 2020-09-10 0129   08/18/2020 1445  azithromycin (ZITHROMAX) 500 mg in sodium chloride 0.9 % 250 mL IVPB        500 mg 250 mL/hr over 60 Minutes Intravenous Every 24 hours 09/06/2020 1444 08/28/20 1444   09/02/2020 1330  ceFEPIme (MAXIPIME) 2 g in sodium chloride 0.9 % 100 mL IVPB        2 g 200 mL/hr over 30 Minutes Intravenous  Once 08/31/2020 1316 08/28/2020 1421   08/21/2020 1330  piperacillin-tazobactam (ZOSYN) IVPB 3.375 g        3.375 g 12.5 mL/hr over 240 Minutes Intravenous Once 08/13/2020 1316 09/06/2020 1525   08/22/2020 1330  vancomycin (VANCOCIN) IVPB 1000 mg/200 mL premix        1,000 mg 200 mL/hr over 60 Minutes Intravenous  Once 08/12/2020 1316 09/02/2020 1525     We will get cta once bp is stable and hr is improved.  Cont neb therapy.

## 2020-08-23 NOTE — Assessment & Plan Note (Signed)
Lab Results  Component Value Date   CREATININE 1.50 (H) 08/11/2020   CREATININE 1.34 (H) 08/21/2020   CREATININE 1.26 (H) 07/12/2020  Follow and monitor gfr and renally dose meds and avoid contrast studies.  Consider v/q in am.

## 2020-08-23 NOTE — H&P (Addendum)
History and Physical    [name] HQI:[id number] DOB: [birth year] DOA: 08/31/2020  PCP: System, Provider Not In   Patient coming from: Home  I have personally briefly reviewed patient's old medical records in [facility]  Chief Complaint: Generalized weakness  HPI: Jeffery Smith is a 86 y.o. male with medical history significant for cardiomyopathy with last known LVEF of 35% - 40%, patient has a LifeVest, moderate to severe aortic stenosis, GERD and BPH who was sent to the emergency room by his primary care provider for evaluation of generalized weakness, mid rectal bleeding and diarrhea for 2 days.  Patient was seen in the emergency room at [facility] over the weekend for constipation and was found to have fecal impaction.  He was manually disimpacted and received an enema and was discharged home.  His wife states that over the last 2 days following the enema he has had multiple episodes of diarrhea stool containing bright red blood.  He does not have a history of hemorrhoids and denies having any abdominal pain.  He denies any NSAID use. His wife who is at the bedside who provides most of the history states that he has been very weak and has had poor oral intake for the last 24 hours. Patient was recently treated for pneumonia and completed a course of antibiotic therapy, wife thinks doxycycline about a week ago. He has a cough which is productive of clear phlegm but denies having any fever or chills.  He denies having any shortness of breath. Labs show sodium 133, potassium 5.1, 97 mg 22, glucose 114, BUN 39, creatinine 1.50, calcium 10.0, anion gap 14 alkaline phosphatase 81, albumin 2.6, AST 37, ALT 18, total protein 6.3, lactic acid 1.6, white count 36.3, hemoglobin 13.8, hematocrit 0.7, MCV 92.9, RDW 13.8, platelet count 372, PT 13.7, INR 1.1 Respiratory viral panel is negative  Chest x-ray reviewed by me shows multifocal areas of airspace opacity consistent with  multifocal pneumonia throughout right lung.  Underlying interstitial fibrosis and mild hyper expansion.  Heart size is normal. CT scan of abdomen and pelvis shows mass-like mural thickening of the distal rectum with some associated mucosal hyperenhancement. Correlation with sigmoidoscopy is suggested to exclude the possibility of infiltrative neoplasm. Areas of airspace consolidation in the lower lobes of the lungs bilaterally (right greater than left), concerning for multilobar pneumonia. Small right parapneumonic pleural effusion. Cholelithiasis or biliary sludge lying dependently in the gallbladder, without evidence of acute cholecystitis at this time. Twelve-lead EKG reviewed by me shows sinus rhythm with PACs and right bundle branch block    ED Course: Patient is a 86 year old Caucasian who presents to the ER at the request of his primary care provider for evaluation of generalized weakness and rectal bleeding for 2 days.  Patient was seen at [facility] for constipation and was found to have fecal impaction, he was manually disimpacted and received an enema and his wife states that he has had diarrhea for 2 days containing bright red blood.  Patient has had poor oral intake as well.  He was hypotensive upon arrival to the ER with systolic blood pressure in the 70s and this has improved to with IV fluid resuscitation.  He has marked leukocytosis and imaging is suggestive of multifocal pneumonia.  Patient recently completed antibiotic therapy about a week ago for community-acquired pneumonia.  He also had a recent steroid injection to his right hip.  He will be admitted to the hospital for further  evaluation.  Review of Systems: As per HPI otherwise 10 point review of systems negative.   Past Medical History:  Diagnosis Date  . Arthritis   . Benign head tremor   . Carotid artery occlusion    right side   . Dysrhythmia    irregular heart beat per patient- skips a beat   .  Enlarged prostate   . GERD (gastroesophageal reflux disease)   . Hard of hearing   . Hyperlipidemia   . Sore throat     Past Surgical History:  Procedure Laterality Date  . CATARACT EXTRACTION     left  . ENDARTERECTOMY  11/03/2011   Procedure: ENDARTERECTOMY CAROTID;  Surgeon: Larina Earthly, MD;  Location: [facility] OR;  Service: Vascular;  Laterality: Right;  Right Carotid endarterectomy with patch angiopolasty  . TONSILLECTOMY    . TOTAL KNEE ARTHROPLASTY     left  . TOTAL KNEE ARTHROPLASTY Right 09/21/2014   Procedure: RIGHT TOTAL KNEE ARTHROPLASTY;  Surgeon: Loanne Drilling, MD;  Location: [facility] ORS;  Service: Orthopedics;  Laterality: Right;     reports that he has never smoked. He has never used smokeless tobacco. He reports current alcohol use. He reports that he does not use drugs.  Allergies  Allergen Reactions  . Horse-Derived Products Anaphylaxis    Family History  Problem Relation Age of Onset  . Cancer Mother   . Cancer Brother      Prior to Admission medications   Medication Sig Start Date End Date Taking? Authorizing Provider  acetaminophen (TYLENOL) 325 MG tablet Take 650 mg by mouth every 6 (six) hours as needed for mild pain, fever or headache.    [provider]  Apoaequorin (PREVAGEN EXTRA STRENGTH PO) Take 1 tablet by mouth See admin instructions. Chew 1 tablet by mouth every evening    [provider]  aspirin EC 81 MG tablet Take 1 tablet (81 mg total) by mouth daily. 10/07/19   Strader, Lennart Pall, PA-C  cholecalciferol (VITAMIN D3) 25 MCG (1000 UNIT) tablet Take 1,000 Units by mouth daily.    [provider]  fluticasone (FLONASE) 50 MCG/ACT nasal spray Place 2 sprays into both nostrils daily.    [provider]  furosemide (LASIX) 20 MG tablet Take 1 tablet (20 mg total) by mouth every other day. 07/13/20 10/11/20  Johnson, Clanford L, MD  ipratropium (ATROVENT) 0.06 % nasal spray Place 2 sprays into both nostrils at bedtime.     [provider]  loratadine (CLARITIN) 10 MG tablet Take 10 mg by mouth daily.    [provider]  omeprazole (PRILOSEC) 20 MG capsule Take 20 mg by mouth at bedtime.     [provider]    Physical Exam: Vitals:   Aug 29, 2020 1145 08/24/2020 1200 29-Aug-2020 1215 08/12/2020 1300  BP: (!) 81/64   (!) 84/61  Pulse: (!) 107 (!) 107 (!) 108 (!) 115  Resp: (!) 31 (!) 24 18 19   Temp:      TempSrc:      SpO2: 91% 92% (!) 85% 91%  Weight:      Height:         Vitals:   08/22/2020 1145 09/06/2020 1200 09/03/2020 1215 08/19/2020 1300  BP: (!) 81/64   (!) 84/61  Pulse: (!) 107 (!) 107 (!) 108 (!) 115  Resp: (!) 31 (!) 24 18 19   Temp:      TempSrc:      SpO2: 91% 92% (!) 85% 91%  Weight:      Height:        Constitutional: NAD, alert and oriented x 3, chronically ill-appearing, frail, has a LifeVest on Eyes: PERRL, lids and conjunctivae pallor ENMT: Mucous membranes are moist.  Patient is hard of hearing Neck: normal, supple, no masses, no thyromegaly Respiratory: Coarse breath sounds in both lung fields, no wheezing, no crackles. Normal respiratory effort. No accessory muscle use. Cardiovascular: Tachycardia, systolic ejection murmur / rubs / gallops. No extremity edema. 2+ pedal pulses. No carotid bruits.  Abdomen: no tenderness, no masses palpated. No hepatosplenomegaly. Bowel sounds positive.  Musculoskeletal: no clubbing / cyanosis. No joint deformity upper and lower extremities.  Skin: no rashes, lesions, ulcers.  Neurologic: No gross focal neurologic deficit.  Generalized weakness Psychiatric: Normal mood and affect.   Labs on Admission: I have personally reviewed following labs and imaging studies  CBC: Recent Labs  Lab 08/21/20 1706 09-22-2020 1115  WBC 12.4* 36.3*  36.1*  NEUTROABS 10.7* 33.0*  HGB 13.5 13.8  14.0  HCT 41.0 41.7  41.6  MCV 95.1 92.9  92.2  PLT 315 372  377   Basic Metabolic Panel: Recent Labs  Lab 08/21/20 1706 09-22-2020 1115    NA 133* 133*  K 4.3 5.1  CL 100 97*  CO2 23 22  GLUCOSE 118* 114*  BUN 24* 39*  CREATININE 1.34* 1.50*  CALCIUM 9.8 10.0   GFR: Estimated Creatinine Clearance: 31.4 mL/min (A) (by C-G formula based on SCr of 1.5 mg/dL (H)). Liver Function Tests: Recent Labs  Lab 09-22-2020 1115  AST 37  ALT 18  ALKPHOS 81  BILITOT 1.3*  PROT 6.3*  ALBUMIN 2.6*   No results for input(s): LIPASE, AMYLASE in the last 168 hours. No results for input(s): AMMONIA in the last 168 hours. Coagulation Profile: Recent Labs  Lab 09-22-2020 1115  INR 1.1   Cardiac Enzymes: No results for input(s): CKTOTAL, CKMB, CKMBINDEX, TROPONINI in the last 168 hours. BNP (last 3 results) No results for input(s): PROBNP in the last 8760 hours. HbA1C: No results for input(s): HGBA1C in the last 72 hours. CBG: No results for input(s): GLUCAP in the last 168 hours. Lipid Profile: No results for input(s): CHOL, HDL, LDLCALC, TRIG, CHOLHDL, LDLDIRECT in the last 72 hours. Thyroid Function Tests: No results for input(s): TSH, T4TOTAL, FREET4, T3FREE, THYROIDAB in the last 72 hours. Anemia Panel: No results for input(s): VITAMINB12, FOLATE, FERRITIN, TIBC, IRON, RETICCTPCT in the last 72 hours. Urine analysis:    Component Value Date/Time   COLORURINE YELLOW 09/14/2014 0920   APPEARANCEUR CLEAR 09/14/2014 0920   LABSPEC 1.019 09/14/2014 0920   PHURINE 5.5 09/14/2014 0920   GLUCOSEU NEGATIVE 09/14/2014 0920   HGBUR NEGATIVE 09/14/2014 0920   BILIRUBINUR neg 11/04/2019 0903   KETONESUR NEGATIVE 09/14/2014 0920   PROTEINUR Negative 11/04/2019 0903   PROTEINUR NEGATIVE 09/14/2014 0920   UROBILINOGEN negative (A) 11/04/2019 0903   UROBILINOGEN 0.2 09/14/2014 0920   NITRITE neg 11/04/2019 0903   NITRITE NEGATIVE 09/14/2014 0920   LEUKOCYTESUR Negative 11/04/2019 0903    Radiological Exams on Admission: CT ABDOMEN PELVIS W CONTRAST  Result Date: Oct 18, 2019 CLINICAL DATA:  [age] year old male with history of  acute onset of nonlocalized abdominal pain. Blood from rectum. EXAM: CT ABDOMEN AND PELVIS WITH CONTRAST TECHNIQUE: Multidetector CT imaging of the abdomen and pelvis was performed using the standard protocol following bolus administration of intravenous contrast. CONTRAST:  75mL OMNIPAQUE IOHEXOL 300 MG/ML  SOLN COMPARISON:  No priors. FINDINGS: Lower  chest: Electronic devices in the subcutaneous fat of the lower back causing extensive beam hardening artifact. Small right pleural effusion lying dependently. Patchy airspace consolidation in the right lower lobe. Patchy peribronchovascular ground-glass attenuation and septal thickening also noted in the lower lobes of the lungs bilaterally. Aortic atherosclerosis. Small hiatal hernia. Hepatobiliary: 1.5 cm low-attenuation lesion in the central aspect of the right lobe of the liver (axial image 18 of series 2), compatible with a simple cyst. Several other tiny 2-3 mm low-attenuation lesions noted in the liver, too small to characterize, but statistically likely to represent tiny cysts or biliary hamartomas. No definite suspicious hepatic lesions. No intra or extrahepatic biliary ductal dilatation. Trace amount of high attenuation material lying dependently in the gallbladder, likely to reflect biliary sludge or tiny noncalcified gallstones. No findings to suggest an acute cholecystitis at this time. Pancreas: No pancreatic mass. No pancreatic ductal dilatation. No pancreatic or peripancreatic fluid collections or inflammatory changes. Spleen: Unremarkable. Adrenals/Urinary Tract: 2 mm nonobstructive calculus in the lower pole collecting system of the left kidney. Bilateral kidneys and adrenal glands are otherwise normal in appearance. No hydroureteronephrosis. Urinary bladder is normal in appearance. Stomach/Bowel: Normal appearance of the stomach. No pathologic dilatation of small bowel or colon. Masslike mural thickening in the distal rectum where there is also some  mucosal hyperenhancement, best appreciated on axial images 83-86 of series 2. Numerous colonic diverticulae are noted, without surrounding inflammatory changes to suggest an acute diverticulitis at this time. Vascular/Lymphatic: Aortic atherosclerosis, without evidence of aneurysm or dissection in the abdominal or pelvic vasculature. No lymphadenopathy noted in the abdomen or pelvis. Reproductive: Prostate gland and seminal vesicles are unremarkable in appearance. Other: No significant volume of ascites.  No pneumoperitoneum. Musculoskeletal: Chronic appearing T12 compression fracture with 30% loss of anterior vertebral body height. There are no aggressive appearing lytic or blastic lesions noted in the visualized portions of the skeleton. IMPRESSION: 1. Mass-like mural thickening of the distal rectum with some associated mucosal hyperenhancement. Correlation with sigmoidoscopy is suggested to exclude the possibility of infiltrative neoplasm. 2. Areas of airspace consolidation in the lower lobes of the lungs bilaterally (right greater than left), concerning for multilobar pneumonia. 3. Small right parapneumonic pleural effusion. 4. Cholelithiasis or biliary sludge lying dependently in the gallbladder, without evidence of acute cholecystitis at this time. 5. Colonic diverticulosis. 6. 2 mm nonobstructive calculus in the lower pole collecting system of the left kidney. 7. Aortic atherosclerosis. 8. Additional incidental findings, as above. Electronically Signed   By: Trudie Reed M.D.   On: 08/24/2020 13:08   DG Chest Port 1 View  Result Date: 09/03/2020 CLINICAL DATA:  Rectal bleeding and diarrhea. Concern for potential sepsis EXAM: PORTABLE CHEST 1 VIEW COMPARISON:  July 12, 2020 FINDINGS: There is airspace opacity throughout multiple sites in the right lung. There is underlying hyperexpansion, stable. There is diffuse interstitial thickening throughout the lungs, stable. Heart size and pulmonary vascular  normal. No adenopathy. There is aortic atherosclerosis. No bone lesions. IMPRESSION: Multifocal areas of airspace opacity consistent with multifocal pneumonia throughout right lung. Underlying interstitial fibrosis and mild hyperexpansion. Heart size normal. No adenopathy evident. Aortic Atherosclerosis (ICD10-I70.0). Electronically Signed   By: Bretta Bang III M.D.   On: 08/18/2020 13:49   DG Abd Portable 1 View  Result Date: 08/21/2020 CLINICAL DATA:  Constipation EXAM: PORTABLE ABDOMEN - 1 VIEW COMPARISON:  July 02, 2020. FINDINGS: Air and stool-filled nondilated loops of bowel. Moderate colonic stool burden predominately in the LEFT hemicolon. Mild  distension of loops of bowel in the RIGHT hemiabdomen. Reticulation of bilateral lung bases likely reflecting underlying interstitial lung disease versus pulmonary edema. Degenerative changes of the lumbar spine. Prominent bladder silhouette. IMPRESSION: 1. Moderate colonic stool burden predominately in the LEFT hemicolon. 2. Nonobstructive bowel gas pattern. 3. Reticulation of bilateral lung bases likely reflecting underlying interstitial lung disease versus pulmonary edema. Electronically Signed   By: Meda Klinefelter MD   On: 08/21/2020 17:56    EKG: Independently reviewed.  Sinus tachycardia PACs  Assessment/Plan Principal Problem:   Generalized weakness Active Problems:   Chronic kidney disease, stage 3a (HCC)   GERD (gastroesophageal reflux disease)   Sepsis (HCC)   Rectal bleeding   Leukemoid reaction   Chronic systolic CHF (congestive heart failure) (HCC)     Generalized weakness Most likely secondary to volume depletion from diarrhea resulting in hypotension Patient denies having any falls or loss of consciousness He was hypotensive upon arrival to the ER with systolic blood pressure in the 70s and this has improved with IV fluid hydration with his systolic  blood pressure currently in the 80s  Place patient on fall  precautions Hold antihypertensive medications for now   Suspected sepsis from community-acquired pneumonia Patient was hypotensive with systolic blood pressure in the 70s upon arrival, he was tachycardic and tachypneic and noted to have marked leukocytosis of 36,000 with imaging suggestive of multifocal pneumonia Patient received 1 L fluid bolus in the ER with improvement in his blood pressure He is awake and alert and appears to be at his baseline Hesitant to administer more IV fluids due to history of moderate to severe aortic stenosis as well as chronic systolic heart failure He received a dose of Zosyn, cefepime and vancomycin in the ER Marked leukocytosis may be secondary to leukemoid reaction and related to rectal bleed as well as recent steroid injection in his right hip We will obtain procalcitonin levels We will place patient empirically on Rocephin and Zithromax    Leukemoid reaction Most likely related to systemic steroid injection the patient received in his right hip for osteoarthritis about a week ago as well as rectal bleed    Rectal bleed ??  Hemorrhoidal bleed Patient received an enema for constipation and fecal impaction and has had diarrhea continued bright red blood CT scan of abdomen and pelvis shows mass-like mural thickening of the distal rectum with some associated mucosal hyperenhancement. Correlation with sigmoidoscopy is suggested to exclude the possibility of infiltrative neoplasm. We will consult GI   Chronic systolic heart failure with dilated cardiomyopathy Patient has a LifeVest He is on furosemide 20 mg every other day and is not on an ACE inhibitor or beta-blocker due to relative hypotension We will request cardiology consult   Chronic kidney disease stage III Stable Slight bump in patient's serum creatinine and BUN levels from dehydration    DVT prophylaxis: SCD Code Status: Full code Family Communication: Greater than 50% of time was spent  discussing plan of care with patient and his wife at the bedside.  All questions and concerns have been addressed.  They verbalized understanding and agreed with the plan.  CODE STATUS was discussed and the patient requests to be a full code Disposition Plan: Back to previous home environment Consults called: Cardiology/GI    Arlis Yale MD [facility]     2020/09/20, 2:29 PM

## 2020-08-23 NOTE — Consult Note (Signed)
NAME:  Jeffery PulseFrederick M Smith, MRN:  [id number], DOB:  [birth year], LOS: 0 ADMISSION DATE:  Nov 29, 2019, CONSULTATION DATE:  Nov 29, 2019 REFERRING MD:  TRH, CHIEF COMPLAINT:  Septic shock  Brief History   86 year old presenting from home with rectal bleeding and diarrhea after recent fecal disimpaction with progressive generalized weakness and poor PO intake.  Hypotensive with mild AKI with CXR consistent with multifocal pneumonia.  Hgb stable.  [facility], however had worsening hypotension requiring vasopressor support and hypoxia now on NRB, PCCM consulted for further ICU management.   History of present illness   HPI obtained from patient and patient's wife, Kathie RhodesBetty, and their daughter Jamesetta Sohyllis at bedside, as patient is poor historian.   86 year old male with prior history of systolic and diastolic HF with recent EF 35-40% on 06/28/2020 with LifeVest, moderate to severe AS, GERD, BPH, HLD, and HOH presenting from home with two day history of progressive generalized weakness, poor PO intake/ appetite, and rectal bleeding with diarrhea.    [facility], found to have a fecal impaction and therefore manually disimpacted and given enema.  Wife reports she last gave him a laxative yesterday.  Reported bright red blood with diarrhea at home but blood only on his depends, and not mixed in stool per wife.  Additionally over the last month, he has received a steroid injection for his right hip 11/2, sustained a fall at home, and he was taken off his carvedilol and losartan by cardiology for ongoing hypotension.  Wife reports he was treated for pneumonia 1-2 weeks ago (? 10/29) with doxycyline.  At that time, he did not have fever but had a productive cough, since resolved.  Denies any recent fever or chills other than patient always being cold, no vomiting, focal weakness, LOC, chest pain, leg swelling, abdominal pain, dysuria, or vomiting.  Does report some  clear sputum which is pink-tinged at times.  Patient reported some SOB earlier today, since resolved.  No reports of obvious dysphagia or coughing episodes after eating but wife noted the other day, he held some [place] bacon in his mouth and kept chewing like he could not remember to swallow.    In ER, patient afebrile with initial BP 85/66, tachypnea and 90% on room air.  Labs noted for WBC 36.3, Hgb stable 13.8, Na 133, K 5.1, CL 97, glucose 114, BUN 39, sCr 1.5 (baseline  ~1.2 - 1.4), low albumin and protein, normal coags, SARS2/ flu negative, CXR consistent with multifocal pneumonia, CT abdomen/ pelvis showed  mass-like mural thickening of the distal rectum with associated mucosal hyperenhancement; suggest sigmoidoscopy to exclude possibility of infiltrative neoplasm.  Additionally noted for bilateral lower lobe consolidation, right greater than left, with small pleural effusion.  Blood cultures sent and given vancomycin, zosyn, and cefepime in ER.  He was given a 1L NS bolus with some improvement in hypotension, however ended up on peripheral levophed.  He was placed on nasal cannula however had acute increase in oxygen requirements to NRB.  Wife reports he ate on a boxed lunch in the ER and then got sicker.   [facility] however given vasopressor and oxygen requirements, PCCM consulted for further ICU management.   Past Medical History  Systolic and diastolic HF with EF 35-40% (06/28/2020) with LifeVest, moderate to severe AS, GERD, BPH, [facility]  Significant Hospital Events   11/15 Admitted   Consults:  Cardiology GI  Procedures:   Significant Diagnostic Tests:  11/15 CT a/p >> 1. Mass-like mural thickening of the distal rectum with some associated mucosal hyperenhancement. Correlation with sigmoidoscopy is suggested to exclude the possibility of infiltrative neoplasm. 2. Areas of airspace consolidation in the lower lobes of the lungs bilaterally (right greater than left),  concerning for multilobar pneumonia. 3. Small right parapneumonic pleural effusion. 4. Cholelithiasis or biliary sludge lying dependently in the gallbladder, without evidence of acute cholecystitis at this time. 5. Colonic diverticulosis. 6. 2 mm nonobstructive calculus in the lower pole collecting system of the left kidney. 7. Aortic atherosclerosis. 8. Additional incidental findings, as above.  Micro Data:  11/15 SARS 2/ Flu >> neg 11/15 BCx2 >> 11/15 MRSA >>   Antimicrobials:  11/15 vanc 11/15 zosyn  11/15 cefepime 11/15 azithro >> 11/16 unasyn >>  Interim history/subjective:  Currently denies any SOB or pain.  NE at 4 mcg peripherally   Objective   Blood pressure (!) 83/59, Smith (!) 116, temperature 98.8 F (37.1 C), temperature source Oral, resp. rate (!) 26, height 6\' 1"  (1.854 m), weight 72.1 kg, SpO2 96 %.        Intake/Output Summary (Last 24 hours) at 08/14/2020 2046 Last data filed at 08/27/2020 1628 Gross per 24 hour  Intake 1600 ml  Output --  Net 1600 ml   Filed Weights   08/31/2020 1050  Weight: 72.1 kg   Examination: General:  Thin elderly male sitting upright in bed in NAD HEENT: MM pink/dry, pupils 3/reactive, wearing glasses, anicteric, no JVD, slightly HOH Neuro: Awake, oriented to person, place and month, appropriate, MAE CV: ST, 109, systolic murmur, +2 distal pulses, lifevest currently on patient PULM:  Mild tachypnea, dry cough, clear anteriorly, bibasilar rales- currently on NRB at 99-100% GI: soft, NT, ND, bs+ active, urinary pouch in place Extremities: warm/dry, no LE edema  Skin: no rashes   Resolved Hospital Problem list    Assessment & Plan:   Septic shock secondary to pneumonia P:  ICU monitoring S/p 1 NS, cautious with further fluid boluses given chronic combined HF and AS Continue peripheral levophed for MAP goal > 65 Check UA   PCT pending Repeat TTE in am Continue azithro and unasyn (for aspiration coverage) Abd exam  benign, + for recent laxatives, no concern for Cdiff w/ leukemoid reaction Trend WBC/ fever curve Follow culture data   Hypoxic respiratory failure CAP/ multifocal, r/o component of aspiration P:  ABG reassuring High risk for intubation  NPO/ Aspiration precautions Wean supplemental O2 for sat goal >92% abx as above CXR in am  Send urinary strep and legionella  SLP eval tomorrow  Some question of underlying fibrosis on imaging, can f/u outpatient with repeat imaging in several weeks   Mild AKI on CKD stage IIIa, likely pre-renal given poor PO intake P:  S/p 1L NS in ER Send for UA Trend renal indices Strict I/Os Avoid further nephrotoxins    Combined systolic and diastolic HF with EF 35-40% on 08/25/20 with LifeVest Moderate to severe AS - f/b Dr. 04/4258 - some recent component of hypotension in which his carvedilol and losartan  - doubt acute exacerbation  P:  Cardiology consulted by [facility] Tele monitoring  Strict I/Os/ daily weights  Holding home lasix / metoprolol   Rectal bleeding/ diarrhea - s/p disimpaction 11/13 and laxatives at home 11/14 - CT a/p showing mass-like mural thickening of the distal rectum with associated mucosal hyperenhancement; suggest sigmoidoscopy to exclude possibility of infiltrative neoplasm.  P:  Hgb 13.8,  Trend CBC On arrival to ICU, brown watery stool, no gross blood Transfuse for Hgb < 8 (given HF)    ? Failure to thrive/ protein calorie malnutrition, BMI 20.98 - on chart review-> weight 77.1 on 07/12/2020 to now 72.1   Best practice:  Diet: NPO Pain/Anxiety/Delirium protocol (if indicated): n/a VAP protocol (if indicated): n/a DVT prophylaxis: SCDs only, if H/H remains stable, consider adding heparin SQ in am GI prophylaxis: n/a Glucose control: trend on BMET, add SSI if > 180 Mobility: BR Code Status: Full, patient wishes for all medical care, verified with patient and family at bedside.  Family Communication: Kathie Rhodes, wife  [phone] and daughter Jamesetta So [phone].  Wife ask that we call Jamesetta So first tonight if needed.  Disposition:   Labs   CBC: Recent Labs  Lab 08/21/20 1706 08/16/2020 1115  WBC 12.4* 36.3*   36.1*  NEUTROABS 10.7* 33.0*  HGB 13.5 13.8   14.0  HCT 41.0 41.7   41.6  MCV 95.1 92.9   92.2  PLT 315 372   377    Basic Metabolic Panel: Recent Labs  Lab 08/21/20 1706 08/17/2020 1115  NA 133* 133*  K 4.3 5.1  CL 100 97*  CO2 23 22  GLUCOSE 118* 114*  BUN 24* 39*  CREATININE 1.34* 1.50*  CALCIUM 9.8 10.0   GFR: Estimated Creatinine Clearance: 31.4 mL/min (A) (by C-G formula based on SCr of 1.5 mg/dL (H)). Recent Labs  Lab 08/21/20 1706 09/02/2020 1115 08/25/2020 1200  WBC 12.4* 36.3*   36.1*  --   LATICACIDVEN  --   --  1.6    Liver Function Tests: Recent Labs  Lab 08/31/2020 1115  AST 37  ALT 18  ALKPHOS 81  BILITOT 1.3*  PROT 6.3*  ALBUMIN 2.6*   No results for input(s): LIPASE, AMYLASE in the last 168 hours. No results for input(s): AMMONIA in the last 168 hours.  ABG    Component Value Date/Time   PHART 7.45 08/13/2020 1827   PCO2ART 32 08/22/2020 1827   PO2ART 78 (L) 08/09/2020 1827   HCO3 22.2 08/25/2020 1827   ACIDBASEDEF 1.1 09/06/2020 1827   O2SAT 96.0 08/27/2020 1827     Coagulation Profile: Recent Labs  Lab 09/07/2020 1115  INR 1.1    Cardiac Enzymes: No results for input(s): CKTOTAL, CKMB, CKMBINDEX, TROPONINI in the last 168 hours.  HbA1C: Hgb A1c MFr Bld  Date/Time Value Ref Range Status  07/12/2020 07:48 AM 5.8 (H) 4.8 - 5.6 % Final    Comment:    (NOTE) Pre diabetes:          5.7%-6.4%  Diabetes:              >6.4%  Glycemic control for   <7.0% adults with diabetes     CBG: No results for input(s): GLUCAP in the last 168 hours.  Review of Systems:   Review of Systems  Constitutional: Positive for malaise/fatigue. Negative for chills and fever.  Respiratory: Positive for cough and shortness of breath. Negative for  hemoptysis, sputum production and wheezing.   Cardiovascular: Negative for chest pain, palpitations and leg swelling.  Gastrointestinal: Positive for blood in stool and diarrhea. Negative for abdominal pain and vomiting.  Genitourinary: Negative for dysuria.  Neurological: Positive for weakness. Negative for dizziness, speech change, focal weakness, loss of consciousness and headaches.   Past Medical History  He,  has a past medical history of Arthritis, Benign head tremor, Carotid artery occlusion,  Dysrhythmia, Enlarged prostate, GERD (gastroesophageal reflux disease), Hard of hearing, Hyperlipidemia, and Sore throat.   Surgical History    Past Surgical History:  Procedure Laterality Date   CATARACT EXTRACTION     left   ENDARTERECTOMY  11/03/2011   Procedure: ENDARTERECTOMY CAROTID;  Surgeon: Larina Earthly, MD;  Location: [facility] OR;  Service: Vascular;  Laterality: Right;  Right Carotid endarterectomy with patch angiopolasty   TONSILLECTOMY     TOTAL KNEE ARTHROPLASTY     left   TOTAL KNEE ARTHROPLASTY Right 09/21/2014   Procedure: RIGHT TOTAL KNEE ARTHROPLASTY;  Surgeon: Loanne Drilling, MD;  Location: [facility] ORS;  Service: Orthopedics;  Laterality: Right;     Social History   reports that he has never smoked. He has never used smokeless tobacco. He reports current alcohol use. He reports that he does not use drugs.   Family History   His family history includes Cancer in his brother and mother.   Allergies Allergies  Allergen Reactions   Horse-Derived Products Anaphylaxis     Home Medications  Prior to Admission medications   Medication Sig Start Date End Date Taking? Authorizing Provider  acetaminophen (TYLENOL) 325 MG tablet Take 650 mg by mouth every 6 (six) hours as needed for mild pain, fever or headache.   Yes [provider]  Apoaequorin (PREVAGEN EXTRA STRENGTH PO) Take 1 tablet by mouth See admin instructions. Chew 1 tablet by mouth every evening   Yes  [provider]  aspirin EC 81 MG tablet Take 1 tablet (81 mg total) by mouth daily. 10/07/19  Yes Strader, [place] M, PA-C  cholecalciferol (VITAMIN D3) 25 MCG (1000 UNIT) tablet Take 1,000 Units by mouth daily.   Yes [provider]  doxycycline (VIBRAMYCIN) 100 MG capsule Take 100 mg by mouth 2 (two) times daily. 08/06/20  Yes [provider]  enalapril (VASOTEC) 2.5 MG tablet Take 2.5 mg by mouth daily. 08/05/20  Yes [provider]  fluticasone (FLONASE) 50 MCG/ACT nasal spray Place 2 sprays into both nostrils daily.   Yes [provider]  furosemide (LASIX) 20 MG tablet Take 1 tablet (20 mg total) by mouth every other day. 07/13/20 10/11/20 Yes Johnson, Clanford L, MD  metoprolol succinate (TOPROL-XL) 25 MG 24 hr tablet Take 25 mg by mouth daily. 07/30/20  Yes [provider]  omeprazole (PRILOSEC) 20 MG capsule Take 20 mg by mouth at bedtime.    Yes [provider]  ipratropium (ATROVENT) 0.06 % nasal spray Place 2 sprays into both nostrils at bedtime. Patient not taking: Reported on 08/19/2020    [provider]  loratadine (CLARITIN) 10 MG tablet Take 10 mg by mouth daily. Patient not taking: Reported on 08/30/2020    [provider]     Critical care time: 60 mins    Posey Boyer, ACNP [facility] 08/22/2020, 11:21 PM  See Loretha Stapler for personal pager PCCM on call pager [phone]

## 2020-08-23 NOTE — Assessment & Plan Note (Signed)
Pt admitted with septic shock and severe sepsis with no response to ivf bolus received in ED. Pt bp is still low and after d/w family they want patient to be full code. I am therefore changing pt's disposition to icu and start low dose pressor therapy with goal MAP of 65  Initially thru PIV and change to central or picc or midline per AM MD team.  Due to pt's reduced ef systolic heart failure ivf resuscitation is restricted.  Pt is continued on his iv abx regimen.

## 2020-08-23 NOTE — Progress Notes (Signed)
CODE SEPSIS - PHARMACY COMMUNICATION  **Broad Spectrum Antibiotics should be administered within 1 hour of Sepsis diagnosis**  Time Code Sepsis Called/Page Received: 1317  Antibiotics Ordered: Zosyn, Vanc, & Cefepime  Time of 1st antibiotic administration: Meropenem @ 1322, Zosyn @ 1326  Otelia Sergeant, PharmD, [facility] 08/22/2020 1:45 PM

## 2020-08-23 NOTE — ED Notes (Signed)
REPORT CALLED TO ICU ALL QUESTIONS ANSWERED

## 2020-08-23 NOTE — ED Notes (Signed)
Pt to CT

## 2020-08-23 NOTE — ED Provider Notes (Addendum)
[facility] Emergency Department Provider Note  Time seen: 11:15 AM  I have reviewed the triage vital signs and the nursing notes.   HISTORY  Chief Complaint Rectal Bleeding   HPI Jeffery Smith is a 86 y.o. male with a past medical history of gastric reflux, hyperlipidemia, CKD, CAD with a LifeVest, CHF, presents to the emergency department for generalized weakness and rectal bleeding.  According to the patient last week he was feeling very constipated came to the emergency department this past Saturday and was diagnosed with a fecal impaction.  Had an enema performed.  State the patient began passing stool but also had blood per rectum.  Wife states the patient has had several bloody bowel movements since.  Denies any abdominal pain.  No fever.  Patient was feeling weak, PCP sent to the ED for evaluation.  Patient found to be hypotensive in the 70s upon arrival.  Past Medical History:  Diagnosis Date  . Arthritis   . Benign head tremor   . Carotid artery occlusion    right side   . Dysrhythmia    irregular heart beat per patient- skips a beat   . Enlarged prostate   . GERD (gastroesophageal reflux disease)   . Hard of hearing   . Hyperlipidemia   . Sore throat     Patient Active Problem List   Diagnosis Date Noted  . Acute exacerbation of CHF (congestive heart failure) (HCC) 07/12/2020  . Hyperlipidemia   . Hard of hearing   . GERD (gastroesophageal reflux disease)   . Elevated brain natriuretic peptide (BNP) level   . Anemia due to chronic kidney disease   . Acute CHF (congestive heart failure) (HCC) 06/28/2020  . Acute respiratory failure with hypoxia (HCC) 06/28/2020  . Acute on chronic diastolic CHF (congestive heart failure) (HCC) 06/28/2020  . Moderate to severe aortic stenosis 06/28/2020  . H/O carotid endarterectomy 06/28/2020  . Elevated troponin 06/28/2020  . Chronic kidney disease, stage 3a (HCC) 06/28/2020  . OA (osteoarthritis)  of knee 09/21/2014  . Occlusion and stenosis of carotid artery without mention of cerebral infarction 11/21/2011    Past Surgical History:  Procedure Laterality Date  . CATARACT EXTRACTION     left  . ENDARTERECTOMY  11/03/2011   Procedure: ENDARTERECTOMY CAROTID;  Surgeon: Larina Earthly, MD;  Location: [facility] OR;  Service: Vascular;  Laterality: Right;  Right Carotid endarterectomy with patch angiopolasty  . TONSILLECTOMY    . TOTAL KNEE ARTHROPLASTY     left  . TOTAL KNEE ARTHROPLASTY Right 09/21/2014   Procedure: RIGHT TOTAL KNEE ARTHROPLASTY;  Surgeon: Loanne Drilling, MD;  Location: [facility] ORS;  Service: Orthopedics;  Laterality: Right;    Prior to Admission medications   Medication Sig Start Date End Date Taking? Authorizing Provider  acetaminophen (TYLENOL) 325 MG tablet Take 650 mg by mouth every 6 (six) hours as needed for mild pain, fever or headache.    [provider]  Apoaequorin (PREVAGEN EXTRA STRENGTH PO) Take 1 tablet by mouth See admin instructions. Chew 1 tablet by mouth every evening    [provider]  aspirin EC 81 MG tablet Take 1 tablet (81 mg total) by mouth daily. 10/07/19   Strader, Lennart Pall, PA-C  cholecalciferol (VITAMIN D3) 25 MCG (1000 UNIT) tablet Take 1,000 Units by mouth daily.    [provider]  fluticasone (FLONASE) 50 MCG/ACT nasal spray Place 2 sprays into both nostrils daily.    [provider]  furosemide (LASIX) 20 MG tablet Take 1 tablet (20 mg total) by mouth every other day. 07/13/20 10/11/20  Johnson, Clanford L, MD  ipratropium (ATROVENT) 0.06 % nasal spray Place 2 sprays into both nostrils at bedtime.    [provider]  loratadine (CLARITIN) 10 MG tablet Take 10 mg by mouth daily.    [provider]  omeprazole (PRILOSEC) 20 MG capsule Take 20 mg by mouth at bedtime.     [provider]    Allergies  Allergen Reactions  . Horse-Derived Products Anaphylaxis    Family History   Problem Relation Age of Onset  . Cancer Mother   . Cancer Brother     Social History Social History   Tobacco Use  . Smoking status: Never Smoker  . Smokeless tobacco: Never Used  Vaping Use  . Vaping Use: Never used  Substance Use Topics  . Alcohol use: Yes    Comment: 1 glass of wine daily  . Drug use: No    Review of Systems Constitutional: Negative for fever. Cardiovascular: Negative for chest pain. Respiratory: Negative for shortness of breath. Gastrointestinal: Negative for abdominal pain, vomiting and diarrhea.  Positive for rectal bleeding.   Genitourinary: Negative for urinary compaints Musculoskeletal: Negative for musculoskeletal complaints Neurological: Negative for headache All other ROS negative  ____________________________________________   PHYSICAL EXAM:  VITAL SIGNS: ED Triage Vitals  Enc Vitals Group     BP 08/25/2020 1051 (!) 72/50     Pulse Rate 08/28/2020 1051 (!) 116     Resp 08/16/2020 1051 16     Temp 08/20/2020 1051 97.9 F (36.6 C)     Temp Source 08/14/2020 1051 Oral     SpO2 08/27/2020 1051 90 %     Weight 08/12/2020 1050 159 lb (72.1 kg)     Height 09/04/2020 1050 6\' 1"  (1.854 m)     Head Circumference --      Peak Flow --      Pain Score 08/21/2020 1058 0     Pain Loc --      Pain Edu? --      Excl. in GC? --    Constitutional: Alert and oriented. Well appearing and in no distress. Eyes: Normal exam ENT      Head: Normocephalic and atraumatic.      Mouth/Throat: Mucous membranes are moist. Cardiovascular: Normal rate, regular rhythm.  LifeVest in place. Respiratory: Normal respiratory effort without tachypnea nor retractions. Breath sounds are clear Gastrointestinal: Soft and nontender. No distention.   Musculoskeletal: Nontender with normal range of motion in all extremities.  Neurologic:  Normal speech and language. No gross focal neurologic deficits  Skin:  Skin is warm, dry and intact.  Psychiatric: Mood and affect are  normal.  ____________________________________________    EKG  EKG viewed and interpreted by myself shows a sinus tachycardia 112 bpm with a widened QRS, left axis deviation, largely normal intervals nonspecific ST changes.  ____________________________________________    RADIOLOGY  CT scan shows mural thickening of the distal rectum.  Airspace consolidation in bilateral lungs.  ____________________________________________   INITIAL IMPRESSION / ASSESSMENT AND PLAN / ED COURSE  Pertinent labs & imaging results that were available during my care of the patient were reviewed by me and considered in my medical decision making (see chart for details).   Patient presents to the emergency department for generalized weakness found to be hypotensive in the 70s.  Patient has had rectal bleeding over the past 2 to 3 days.  Patient has a benign abdomen.  Rectal examination does show light brown stool but strongly guaiac positive.  We will check labs, IV hydrate with 1 L bolus and continue to closely monitor.  CT scan shows what appears to be bilateral pneumonia in addition to a mural thickening of the rectum.  Patient's white blood cell count has resulted at 36,000 although reassuringly fairly normal lactate.  Given the patient's significant leukocytosis significantly increased from 2 days ago with hypotension and tachycardia meeting sepsis criteria I have started the patient on broad-spectrum antibiotics.  Covid test is negative.  I spoke to the hospitalist will be admitting the patient to their service.  Patient remained somewhat hypotensive although improved from arrival.  We will be careful with our IV hydration given his CHF history.  Continues to appear overall well, mentating well.  ORRIN YURKOVICH was evaluated in Emergency Department on 09/22/20 for the symptoms described in the history of present illness. He was evaluated in the context of the global COVID-19 pandemic, which  necessitated consideration that the patient might be at risk for infection with the SARS-CoV-2 virus that causes COVID-19. Institutional protocols and algorithms that pertain to the evaluation of patients at risk for COVID-19 are in a state of rapid change based on information released by regulatory bodies including the CDC and federal and state organizations. These policies and algorithms were followed during the patient's care in the ED.  CRITICAL CARE Performed by: Minna Antis   Total critical care time: 30 minutes  Critical care time was exclusive of separately billable procedures and treating other patients.  Critical care was necessary to treat or prevent imminent or life-threatening deterioration.  Critical care was time spent personally by me on the following activities: development of treatment plan with patient and/or surrogate as well as nursing, discussions with consultants, evaluation of patient's response to treatment, examination of patient, obtaining history from patient or surrogate, ordering and performing treatments and interventions, ordering and review of laboratory studies, ordering and review of radiographic studies, pulse oximetry and re-evaluation of patient's condition.   ____________________________________________   FINAL CLINICAL IMPRESSION(S) / ED DIAGNOSES  Rectal bleeding Weakness Hypotension   Minna Antis, MD 09-22-2020 1351    Minna Antis, MD 09/22/2020 1351

## 2020-08-23 NOTE — Sepsis Progress Note (Signed)
Sepsis protocol is being monitored by [employer]. 

## 2020-08-23 NOTE — ED Notes (Signed)
md hospitalist made aware o f low oxygen sats , verbal order for stat CXR , pts sats remained low on 6lnc , ed md stated to place pt on NRB @ 15 L , hospitalist Dr Jory Sims. Made aware

## 2020-08-23 NOTE — ED Notes (Signed)
Pt placed on 2L [place] for SpO2 89%. Dr Lenard Lance aware

## 2020-08-23 NOTE — ED Triage Notes (Signed)
Pt to ER from home with c/o rectal bleeding and diarrhea since Saturday after being seen for an impaction.  Pt sent after phone call to PCP, pt noted to be hypotensive in triage.

## 2020-08-23 NOTE — ED Notes (Addendum)
See triage note, pt wife reports pt had enema on Wednesday at [facility]. Has been having blood from rectum since Saturday and c/o pain to rectum.  Denies cp, or abdominal pain  Pt noted to have "Life Vest" on arrival

## 2020-08-23 NOTE — Progress Notes (Signed)
Subjective/Objective Received call from dr. Joylene Igo bout pt's declining clinical condition and upon evaluation pt is on 15 L and tachycardic and hypotensive. Pt is mentating well and wife at bedside. Contacted icu md dr. Belia Heman and d/w about moving pt ot unit as family request to  Be full code. D/w nurse Jonny Ruiz about ABG I ordered and moving to icu and starting low dose pressor therapy and cardiology consult.   Scheduled Meds: . fluticasone  2 spray Each Nare Daily  . ipratropium  2 spray Each Nare QHS  . pantoprazole (PROTONIX) IV  40 mg Intravenous Q12H  . sodium chloride flush  3 mL Intravenous Q12H   Continuous Infusions: . sodium chloride    . sodium chloride    . azithromycin Stopped (08/16/2020 1628)  . [START ON 08/24/2020] cefTRIAXone (ROCEPHIN)  IV    . norepinephrine (LEVOPHED) Adult infusion 2 mcg/min (08/17/2020 1910)   PRN Meds:sodium chloride, acetaminophen, ipratropium-albuterol, sodium chloride flush  Vital signs in last 24 hours: Temp:  [97.9 F (36.6 C)-98.9 F (37.2 C)] 98.8 F (37.1 C) (11/15 1850) Pulse Rate:  [107-127] 116 (11/15 1850) Resp:  [16-31] 26 (11/15 1850) BP: (72-88)/(50-66) 83/59 (11/15 1850) SpO2:  [85 %-96 %] 96 % (11/15 1850) Weight:  [72.1 kg] 72.1 kg (11/15 1050)  Intake/Output last 3 shifts: I/O last 3 completed shifts: In: 1600 [IV Piggyback:1600] Out: -  Intake/Output this shift: No intake/output data recorded.  Problem Assessment/Plan Cardiovascular and Mediastinum Chronic systolic CHF (congestive heart failure) (HCC) Assessment & Plan We will hold metoprolol and lasix and enalapril due to hypotension.  Cardiology dr. Santo Held consulted.    Respiratory Respiratory failure with hypoxia (HCC) Assessment & Plan SpO2: 96 % O2 Flow Rate (L/min): 15 L/min  ABG on Current o2 of 15 L/Min Arterial Blood Gas result:  pO2 78 ; pCO2 32; pH7.45 ;  HCO3 22.2, %O2 96. Attribute to pt's pneumonia. Pt s/p 4 iv abx. Anti-infectives (From admission,  onward)   Start     Dose/Rate Route Frequency Ordered Stop   08/24/20 0130  cefTRIAXone (ROCEPHIN) 2 g in sodium chloride 0.9 % 100 mL IVPB        2 g 200 mL/hr over 30 Minutes Intravenous Every 24 hours 09/06/2020 1444 09-02-20 0129   08/22/2020 1445  azithromycin (ZITHROMAX) 500 mg in sodium chloride 0.9 % 250 mL IVPB        500 mg 250 mL/hr over 60 Minutes Intravenous Every 24 hours 08/15/2020 1444 08/28/20 1444   09/07/2020 1330  ceFEPIme (MAXIPIME) 2 g in sodium chloride 0.9 % 100 mL IVPB        2 g 200 mL/hr over 30 Minutes Intravenous  Once 08/09/2020 1316 08/31/2020 1421   08/09/2020 1330  piperacillin-tazobactam (ZOSYN) IVPB 3.375 g        3.375 g 12.5 mL/hr over 240 Minutes Intravenous Once 09/06/2020 1316 08/22/2020 1525   08/15/2020 1330  vancomycin (VANCOCIN) IVPB 1000 mg/200 mL premix        1,000 mg 200 mL/hr over 60 Minutes Intravenous  Once 08/12/2020 1316 08/28/2020 1525     We will get cta once bp is stable and hr is improved.  Cont neb therapy.      Digestive Rectal bleeding Assessment & Plan Pt on uv ppi as he cannot take po until swallow eval due to suspected aspiration. Anemia panel and type and screen.   Genitourinary Chronic kidney disease, stage 3a [facility]) Assessment & Plan Lab Results  Component Value Date  CREATININE 1.50 (H) 09/04/2020   CREATININE 1.34 (H) 08/21/2020   CREATININE 1.26 (H) 07/12/2020  Follow and monitor gfr and renally dose meds and avoid contrast studies.  Consider v/q in am.   Other * Severe sepsis with septic shock [facility]) Assessment & Plan Pt admitted with septic shock and severe sepsis with no response to ivf bolus received in ED. Pt bp is still low and after d/w family they want patient to be full code. I am therefore changing pt's disposition to icu and start low dose pressor therapy with goal MAP of 65  Initially thru PIV and change to central or picc or midline per AM MD team.  Due to pt's reduced ef systolic heart failure ivf  resuscitation is restricted.  Pt is continued on his iv abx regimen.

## 2020-08-24 ENCOUNTER — Inpatient Hospital Stay: Payer: Medicare Other

## 2020-08-24 ENCOUNTER — Inpatient Hospital Stay
Admit: 2020-08-24 | Discharge: 2020-08-24 | Disposition: A | Discharge status: Home / Self Care | Payer: Medicare Other | Attending: Nurse Practitioner | Admitting: Nurse Practitioner

## 2020-08-24 DIAGNOSIS — J9601 Acute respiratory failure with hypoxia: Secondary | ICD-10-CM | POA: Diagnosis not present

## 2020-08-24 DIAGNOSIS — K626 Ulcer of anus and rectum: Secondary | ICD-10-CM

## 2020-08-24 DIAGNOSIS — J189 Pneumonia, unspecified organism: Secondary | ICD-10-CM

## 2020-08-24 DIAGNOSIS — I5022 Chronic systolic (congestive) heart failure: Secondary | ICD-10-CM | POA: Diagnosis not present

## 2020-08-24 DIAGNOSIS — K625 Hemorrhage of anus and rectum: Secondary | ICD-10-CM

## 2020-08-24 DIAGNOSIS — R627 Adult failure to thrive: Secondary | ICD-10-CM

## 2020-08-24 DIAGNOSIS — Z515 Encounter for palliative care: Secondary | ICD-10-CM | POA: Diagnosis not present

## 2020-08-24 DIAGNOSIS — A419 Sepsis, unspecified organism: Secondary | ICD-10-CM | POA: Diagnosis not present

## 2020-08-24 DIAGNOSIS — Z7189 Other specified counseling: Secondary | ICD-10-CM

## 2020-08-24 LAB — CBC
HCT: 37.3 % — ABNORMAL LOW (ref 39.0–52.0)
Hemoglobin: 12.5 g/dL — ABNORMAL LOW (ref 13.0–17.0)
MCH: 30.7 pg (ref 26.0–34.0)
MCHC: 33.5 g/dL (ref 30.0–36.0)
MCV: 91.6 fL (ref 80.0–100.0)
Platelets: 317 10*3/uL (ref 150–400)
RBC: 4.07 MIL/uL — ABNORMAL LOW (ref 4.22–5.81)
RDW: 13.9 % (ref 11.5–15.5)
WBC: 31.9 10*3/uL — ABNORMAL HIGH (ref 4.0–10.5)
nRBC: 0 % (ref 0.0–0.2)

## 2020-08-24 LAB — BASIC METABOLIC PANEL
Anion gap: 11 (ref 5–15)
BUN: 32 mg/dL — ABNORMAL HIGH (ref 8–23)
CO2: 23 mmol/L (ref 22–32)
Calcium: 9.6 mg/dL (ref 8.9–10.3)
Chloride: 102 mmol/L (ref 98–111)
Creatinine, Ser: 1.38 mg/dL — ABNORMAL HIGH (ref 0.61–1.24)
GFR, Estimated: 48 mL/min — ABNORMAL LOW (ref >60)
Glucose, Bld: 123 mg/dL — ABNORMAL HIGH (ref 70–99)
Potassium: 4.3 mmol/L (ref 3.5–5.1)
Sodium: 136 mmol/L (ref 135–145)

## 2020-08-24 LAB — ECHOCARDIOGRAM COMPLETE
AR max vel: 1.1 cm2
AV Area VTI: 1.2 cm2
AV Area mean vel: 1.16 cm2
AV Mean grad: 18 mmHg
AV Peak grad: 36 mmHg
Ao pk vel: 3 m/s
Area-P 1/2: 7.25 cm2
Height: 73 in
S' Lateral: 4.25 cm
Weight: 2447.99 oz

## 2020-08-24 LAB — GLUCOSE, CAPILLARY: Glucose-Capillary: 109 mg/dL — ABNORMAL HIGH (ref 70–99)

## 2020-08-24 LAB — PROCALCITONIN: Procalcitonin: 0.29 ng/mL

## 2020-08-24 LAB — STREP PNEUMONIAE URINARY ANTIGEN: Strep Pneumo Urinary Antigen: NEGATIVE

## 2020-08-24 LAB — VITAMIN B12: Vitamin B-12: 136 pg/mL — ABNORMAL LOW (ref 180–914)

## 2020-08-24 LAB — HEMOGLOBIN AND HEMATOCRIT, BLOOD
HCT: 38.8 % — ABNORMAL LOW (ref 39.0–52.0)
Hemoglobin: 12.8 g/dL — ABNORMAL LOW (ref 13.0–17.0)

## 2020-08-24 LAB — MAGNESIUM: Magnesium: 2.2 mg/dL (ref 1.7–2.4)

## 2020-08-24 MED ORDER — ENSURE ENLIVE PO LIQD
237.0000 mL | Freq: Three times a day (TID) | ORAL | Status: DC
  Administered 2020-08-24 – 2020-08-29 (×7): 237 mL via ORAL

## 2020-08-24 MED ORDER — TRAZODONE HCL 50 MG PO TABS
50.0000 mg | ORAL_TABLET | Freq: Every evening | ORAL | Status: DC | PRN
  Administered 2020-08-24 – 2020-08-27 (×3): 50 mg via ORAL
  Filled 2020-08-24 (×3): qty 1

## 2020-08-24 MED ORDER — MIDODRINE HCL 5 MG PO TABS
10.0000 mg | ORAL_TABLET | Freq: Three times a day (TID) | ORAL | Status: DC
  Administered 2020-08-24 – 2020-08-29 (×14): 10 mg via ORAL
  Filled 2020-08-24 (×14): qty 2

## 2020-08-24 MED ORDER — POLYETHYLENE GLYCOL 3350 17 G PO PACK
17.0000 g | PACK | Freq: Every day | ORAL | Status: DC
  Administered 2020-08-24 – 2020-08-28 (×3): 17 g via ORAL
  Filled 2020-08-24 (×3): qty 1

## 2020-08-24 MED ORDER — ADULT MULTIVITAMIN W/MINERALS CH
1.0000 | ORAL_TABLET | Freq: Every day | ORAL | Status: DC
  Administered 2020-08-25 – 2020-08-29 (×5): 1 via ORAL
  Filled 2020-08-24 (×5): qty 1

## 2020-08-24 NOTE — Evaluation (Cosign Needed)
Clinical/Bedside Swallow Evaluation Patient Details  Name: Jeffery Smith MRN: [id number] Date of Birth: 31-Oct-1924  Today's Date: 08/24/2020 Time: SLP Start Time (ACUTE ONLY): 0830 SLP Stop Time (ACUTE ONLY): 0930 SLP Time Calculation (min) (ACUTE ONLY): 60 min  Past Medical History:  Past Medical History:  Diagnosis Date  . Arthritis   . Benign head tremor   . Carotid artery occlusion    right side   . Dysrhythmia    irregular heart beat per patient- skips a beat   . Enlarged prostate   . GERD (gastroesophageal reflux disease)   . Hard of hearing   . Hyperlipidemia   . Sore throat    Past Surgical History:  Past Surgical History:  Procedure Laterality Date  . CATARACT EXTRACTION     left  . ENDARTERECTOMY  11/03/2011   Procedure: ENDARTERECTOMY CAROTID;  Surgeon: Larina Earthly, MD;  Location: [facility] OR;  Service: Vascular;  Laterality: Right;  Right Carotid endarterectomy with patch angiopolasty  . TONSILLECTOMY    . TOTAL KNEE ARTHROPLASTY     left  . TOTAL KNEE ARTHROPLASTY Right 09/21/2014   Procedure: RIGHT TOTAL KNEE ARTHROPLASTY;  Surgeon: Loanne Drilling, MD;  Location: [facility] ORS;  Service: Orthopedics;  Laterality: Right;   HPI:  Per admitting H&P: "is a 86 y.o. male with medical history significant for cardiomyopathy with last known LVEF of 35% - 40%, patient has a LifeVest, moderate to severe aortic stenosis, GERD and BPH who was sent to the emergency room by his primary care provider for evaluation of generalized weakness, mid rectal bleeding and diarrhea for 2 days.  Patient was seen in the emergency room at [facility] over the weekend for constipation and was found to have fecal impaction.  He was manually disimpacted and received an enema and was discharged home.  His wife states that over the last 2 days following the enema he has had multiple episodes of diarrhea stool containing bright red blood.  He does not have a history of hemorrhoids and denies having any  abdominal pain.  He denies any NSAID use.His wife who is at the bedside who provides most of the history states that he has been very weak and has had poor oral intake for the last 24 hours. Patient was recently treated for pneumonia and completed a course of antibiotic therapy, wife thinks doxycycline about a week ago. He has a cough which is productive of clear phlegm but denies having any fever or chills.  He denies having any shortness of breath."   CXR (11/16): "Diffuse severe bilateral pulmonary infiltrates again noted without interim change.; Small wedge-shaped densities noted over the right upper and left lower lungs. Although these changes may be secondary to atelectasis, pulmonary infarcts cannot be excluded."  CT Head (10/05/2015): "Mild atrophy and chronic microvascular ischemic white matter disease."  Assessment / Plan / Recommendation Clinical Impression  Pt was seen bedside in ICU for Clinical Swallow Evaluation. No Immediate Overt Clinical s/s of aspiration were noted. Recommend Dysphagia 3 Diet (Mech soft) w/ thin liquids VIA CUP; No straw. CXR (11/16): "Diffuse severe bilateral pulmonary infiltrates again noted without interim change.; Small wedge-shaped densities noted over the right upper and left lower lungs. Although these changes may be secondary to atelectasis, pulmonary infarcts cannot be excluded." Pt was awake & alert upon clinician arrival to room. Verbose. Pt able to follow simple commands independently. Unsure of pt cognitive baseline. Pt required min verbal cues to redirect to task of PO  trials, as he appeared min irritated due to wetness in bed, and diminished desire for PO's other than coffee which he requested repeatedly throughout the evaluation. Pt was educated on importance of swallow evaluation prior to other PO's including coffee-- pt given coffee at the end of evaluation once he passed swallow evaluation with no immediate overt clinical s/s of aspiration. Unsure of pt  hearing baseline, but pt understanding appeared improved w/ louder verbalization from clinician.   Oral Mech Exam revealed pt has adequate natural dentition; per pt, he had completed oral care in the AM, and repeated it at the time of evaluation with clinicians. Lingual strength & ROM WFL; labial seal WFL. Pt appeared to have lots of residue on the lips which was cleaned by clinicians during oral care. Volitional cough was phlegmy and productive. Pt vocal quality WNL baseline. During PO trials of ice chips 10x via spoon, thin liquids 10x+ via cup, puree 10x via spoon, and solids 8x via spoon/hand, no Immediate Overt Clinical s/s of aspiration were noted. Pt able to self-feed all trial consistencies w/ set up assist, including upright positioning in bed. Pt required min-mod verbal cues throughout the session to encourage follow-through w/ PO trials-- doesn't appear to be d/t pt cognitive status/attention, but diminished desire in PO's as expressed verbally by pt. Oral Phase grossly WFL: Timely A-P transfer of bolus, awareness of bolus WFL, mastication of solid trials WFL. Min oral residue noted w/ PO's of solids (graham crackers). Min noted oral residue improved w/ safe swallowing strategy of alternating solids & liquids-- education provided. Oral prep WFL: pt exhibiting appropriate awareness of bolus and ability to self-feed w/ set up assist. Speech was clear between all above PO's. No noted increase in WOB/SOB.   Intermittently during PO trials (3x total-- 2x w/ thin liquids, 1x w/ softened solid) pt exhibited delayed phlegmy productive cough. During these events, pt would expectorate reddish-pink phlegm. No drop in O2 levels/desaturation noted during cough. Per NSG, pt is having same presentation baseline when he was NPO (productive cough w/ red phlegm). Aspiration not suspected at this time, d/t: no noted increase in pt respiratory effort, no vocal changes, no distress noted in pt during event, and baseline  presentation of phlegmy cough. When verbally prompted, pt expressed he did not feel "choked or strangled" during coughing event. No recommendation to downgrade pt liquid at this time d/t decreased concern for aspiration, pt advanced age, & QOL considerations.   Recommend Dysphagia 3 (mech soft) diet w/ Thin liquids for safer swallowing; pills whole in puree. No Straws at this time for safer swallowing. Recommend following general aspiration precautions, emphasis on: sitting upright in bed, slow rate of drinking/eating, small bites for increased bolus management, moistening solid foods, and alternating solids/liquids for complete clearance of bolus. Recommend intermittent supervision at mealtime to cue for compensatory strategies. NSG/MD updated. Education provided--posted in room. ST services to sign off at this time. NSG to reconsult for further needs.    SLP Visit Diagnosis: Dysphagia, unspecified (R13.10)    Aspiration Risk  Mild aspiration risk;Risk for inadequate nutrition/hydration (not interested in further POs)    Diet Recommendation   Dysphagia 3 (mech soft) diet w/ Thin liquids; pills whole in puree; NO STRAWS  Medication Administration: Whole meds with puree    Other  Recommendations Recommended Consults:  (dietician) Oral Care Recommendations: Oral care BID   Follow up Recommendations None      Frequency and Duration  n/a  Prognosis Prognosis for Safe Diet Advancement: Fair Barriers to Reach Goals: Time post onset;Severity of deficits      Swallow Study   General Date of Onset: 08/11/2020 HPI: Per admitting H&P: "is a 86 y.o. male with medical history significant for cardiomyopathy with last known LVEF of 35% - 40%, patient has a LifeVest, moderate to severe aortic stenosis, GERD and BPH who was sent to the emergency room by his primary care provider for evaluation of generalized weakness, mid rectal bleeding and diarrhea for 2 days.  Patient was seen in the  emergency room at [facility] over the weekend for constipation and was found to have fecal impaction.  He was manually disimpacted and received an enema and was discharged home.  His wife states that over the last 2 days following the enema he has had multiple episodes of diarrhea stool containing bright red blood.  He does not have a history of hemorrhoids and denies having any abdominal pain.  He denies any NSAID use. His wife who is at the bedside who provides most of the history states that he has been very weak and has had poor oral intake for the last 24 hours. Patient was recently treated for pneumonia and completed a course of antibiotic therapy, wife thinks doxycycline about a week ago. He has a cough which is productive of clear phlegm but denies having any fever or chills.  He denies having any shortness of breath."  CXR (11/16): "Diffuse severe bilateral pulmonary infiltrates again noted without interim change.; Small wedge-shaped densities noted over the right upper and left lower lungs. Although these changes may be secondary to atelectasis, pulmonary infarcts cannot be excluded."   Type of Study: Bedside Swallow Evaluation Previous Swallow Assessment: n/a Diet Prior to this Study: NPO Temperature Spikes Noted: No Respiratory Status: Nasal cannula (2L/min) History of Recent Intubation: No Behavior/Cognition: Alert;Cooperative;Agitated (min agitated-- did not want to eat many PO trials,) Oral Cavity Assessment: Within Functional Limits Oral Care Completed by SLP: Yes Oral Cavity - Dentition: Adequate natural dentition Vision: Functional for self-feeding Self-Feeding Abilities: Able to feed self;Needs set up Patient Positioning: Upright in bed Baseline Vocal Quality: Normal Volitional Cough: Congested (phlegmy)    Oral/Motor/Sensory Function Overall Oral Motor/Sensory Function: Within functional limits   Ice Chips Ice chips: Within functional limits Presentation: Self Fed Other  Comments: 8x   Thin Liquid Thin Liquid: Within functional limits Presentation: Cup Other Comments: 10x+    Nectar Thick Nectar Thick Liquid: Not tested   Honey Thick Honey Thick Liquid: Not tested   Puree Puree: Within functional limits Presentation: Self Fed Other Comments: 10x   Solid     Solid: Within functional limits Presentation: Self Fed Other Comments: 8x      Oliver Pila  Graduate Clinician 08/24/2020,10:31 AM

## 2020-08-24 NOTE — Progress Notes (Signed)
Called by RN to assess patient for possible nasal-tracheal suctioning. On arrival patient is conversational, no apparent respiratory distress, breathing comfortably on 10L HFNC, spo2 92%. Breath sounds diminished on auscultation, no pronounced coarse crackles observed.  Explained to patient and family member that NT suctioning can be a quite uncomfortable procedure, and the patient and his family member asked not to proceed with NT suctioning at this time. Per family member, patient does have a productive cough. Will continue to monitor.

## 2020-08-24 NOTE — Consult Note (Signed)
NAME:  Jeffery Smith, MRN:  [id number], DOB:  Dec 16, 1924, LOS: 1 ADMISSION DATE:  August 27, 2020, CONSULTATION DATE:  08-27-20 REFERRING MD:  TRH, CHIEF COMPLAINT:  Septic shock  Brief History   86 year old presenting from home with rectal bleeding and diarrhea after recent fecal disimpaction with progressive generalized weakness and poor PO intake.  Hypotensive with mild AKI with CXR consistent with multifocal pneumonia.  Hgb stable.  Initially admitted to [facility], however had worsening hypotension requiring vasopressor support and hypoxia now on NRB, PCCM consulted for further ICU management.   History of present illness   HPI obtained from patient and patient's wife, Kathie Rhodes, and their daughter Jamesetta So at bedside, as patient is poor historian.   86 year old male with prior history of systolic and diastolic HF with recent EF 35-40% on 06/28/2020 with LifeVest, moderate to severe AS, GERD, BPH, HLD, and HOH presenting from home with two day history of progressive generalized weakness, poor PO intake/ appetite, and rectal bleeding with diarrhea.    He was evaluated on 11/13 in [facility] ER for constipation, found to have a fecal impaction and therefore manually disimpacted and given enema.  Wife reports she last gave him a laxative yesterday.  Reported bright red blood with diarrhea at home but blood only on his depends, and not mixed in stool per wife.  Additionally over the last month, he has received a steroid injection for his right hip 11/2, sustained a fall at home, and he was taken off his carvedilol and losartan by cardiology for ongoing hypotension.  Wife reports he was treated for pneumonia 1-2 weeks ago (? 10/29) with doxycyline.  At that time, he did not have fever but had a productive cough, since resolved.  Denies any recent fever or chills other than patient always being cold, no vomiting, focal weakness, LOC, chest pain, leg swelling, abdominal pain, dysuria, or vomiting.  Does report some  clear sputum which is pink-tinged at times.  Patient reported some SOB earlier today, since resolved.  No reports of obvious dysphagia or coughing episodes after eating but wife noted the other day, he held some [place] bacon in his mouth and kept chewing like he could not remember to swallow.    In ER, patient afebrile with initial BP 85/66, tachypnea and 90% on room air.  Labs noted for WBC 36.3, Hgb stable 13.8, Na 133, K 5.1, CL 97, glucose 114, BUN 39, sCr 1.5 (baseline  ~1.2 - 1.4), low albumin and protein, normal coags, SARS2/ flu negative, CXR consistent with multifocal pneumonia, CT abdomen/ pelvis showed  mass-like mural thickening of the distal rectum with associated mucosal hyperenhancement; suggest sigmoidoscopy to exclude possibility of infiltrative neoplasm.  Additionally noted for bilateral lower lobe consolidation, right greater than left, with small pleural effusion.  Blood cultures sent and given vancomycin, zosyn, and cefepime in ER.  He was given a 1L NS bolus with some improvement in hypotension, however ended up on peripheral levophed.  He was placed on nasal cannula however had acute increase in oxygen requirements to NRB.  Wife reports he ate on a boxed lunch in the ER and then got sicker.   He was admitted to [facility] however given vasopressor and oxygen requirements, PCCM consulted for further ICU management.   Past Medical History  Systolic and diastolic HF with EF 35-40% (06/28/2020) with LifeVest, moderate to severe AS, GERD, BPH, HLD, [facility]  Significant Hospital Events   11/15 Admitted   Consults:  Cardiology GI  Procedures:   Significant Diagnostic Tests:  11/15 CT a/p >> 1. Mass-like mural thickening of the distal rectum with some associated mucosal hyperenhancement. Correlation with sigmoidoscopy is suggested to exclude the possibility of infiltrative neoplasm. 2. Areas of airspace consolidation in the lower lobes of the lungs bilaterally (right greater than left),  concerning for multilobar pneumonia. 3. Small right parapneumonic pleural effusion. 4. Cholelithiasis or biliary sludge lying dependently in the gallbladder, without evidence of acute cholecystitis at this time. 5. Colonic diverticulosis. 6. 2 mm nonobstructive calculus in the lower pole collecting system of the left kidney. 7. Aortic atherosclerosis. 8. Additional incidental findings, as above.  Micro Data:  11/15 SARS 2/ Flu >> neg 11/15 BCx2 >> 11/15 MRSA >>   Antimicrobials:  11/15 vanc 11/15 zosyn  11/15 cefepime 11/15 azithro >> 11/16 unasyn >>    CC Follow up shock  HPI More alert awake Pressors dosage improving Speech path assessment pending  Objective   Blood pressure (!) 85/57, pulse 96, temperature 98.1 F (36.7 C), temperature source Oral, resp. rate 20, height 6\' 1"  (1.854 m), weight 69.4 kg, SpO2 97 %.        Intake/Output Summary (Last 24 hours) at 08/24/2020 1439 Last data filed at 08/24/2020 1344 Gross per 24 hour  Intake 1983.78 ml  Output 250 ml  Net 1733.78 ml   Filed Weights   08/10/2020 1050 08/24/20 0500  Weight: 72.1 kg 69.4 kg    Review of Systems:  Gen:  Denies  fever, sweats, chills weight loss  Other:  All other systems negative    Physical Examination:   General Appearance: No distress  Neuro:without focal findings,  speech normal,  HEENT: PERRLA, EOM intact.   Pulmonary: normal breath sounds, No wheezing.  CardiovascularNormal S1,S2.  No m/r/g.   Abdomen: Benign, Soft, non-tender. PSYCHIATRIC: Mood, affect within normal limits.   ALL OTHER ROS ARE NEGATIVE    Assessment & Plan:   Septic shock secondary to pneumonia Wean off pressors Sepsis improving Continue IV abx Start midodrine Oxygen as needed  Combined systolic and diastolic HF with EF 35-40% on 08/26/20 with LifeVest Moderate to severe AS Very poor prognosis  Failure to thrive/ protein calorie malnutrition, BMI 20.98 - on chart review-> weight 77.1 on  07/12/2020 to now 72.1    Recommend Palliative care consultation to address goals of care    09/11/2020, M.D.  [facility]  Medical Director [facility] Medical Director [facility] Cardio-Pulmonary Department

## 2020-08-24 NOTE — Consult Note (Signed)
Consultation Note Date: 08/24/2020   Patient Name: Jeffery Smith  DOB: 1925/05/13  MRN: [id number]  Age / Sex: 86 y.o., male  PCP: System, Provider Not In Referring Physician: Flora Lipps, MD  Reason for Consultation: Establishing goals of care  HPI/Patient Profile: 86 y.o. male  with past medical history of CHF EF 35-40%, Lifevest, mod to severe AS, GERD, BPH, HLD, very HOH admitted on 09/02/2020 with progressing weakness, poor po intake, rectal bleeding.  CT scan worrisome for rectal thickening, recommending sigmoidoscopy, GI has been consulted.  He had recent ED visit for fecal impaction. Workup reveals septic shock due to pneumonia. He is requiring vasopressors for BP support. SLP eval did not show signs of aspiration. Palliative medicine consulted for [place].    Clinical Assessment and Goals of Care:   Chart reviewed and report received from [facility], ICU NP.  Met with patient and his daughter at bedside. Patient is very hard of hearing and does not have his hearing aids. He is married and his wife plans to come tomorrow and bring his hearing aids. Prior to admission he was living at home with his wife.  There has been noted decline in his overall picture in the last year.  He has had a few falls at home.  He has been losing weight.  He tells me he feels a little rough but otherwise no pain or complaints. I introduced palliative medicine. I began the initial discussion of goals of care, however patient's daughter felt it would be better to wait until his spouse could be present and the patient could have his hearing aids in place. Patient defined living and goals of care as being awake, and able to interact and do things with his family. We briefly touched on CODE STATUS, patient said he would like CPR and to be put on a ventilator if needed.  I began to discuss with him worries that doing those things  would not return him to his preferred stated goals of care, which is being awake and able to do things with his family, however [name] was not interested in discussing this further.  Primary Decision Maker PATIENT    SUMMARY OF RECOMMENDATIONS -Continue full scope care -Continue full code -Plan made to meet with patient his daughter and his wife on Thursday when patient will have his hearing aids in place      Code Status/Advance Care Planning:  Full code  Prognosis:    Unable to determine  Discharge Planning: To Be Determined  Primary Diagnoses: Present on Admission: . Chronic kidney disease, stage 3a ([place]) . Rectal bleeding . Chronic systolic CHF (congestive heart failure) ([place]) . Respiratory failure with hypoxia ([place]) . Severe sepsis with septic shock ([place])   I have reviewed the medical record, interviewed the patient and family, and examined the patient. The following aspects are pertinent.  Scheduled Meds: . Chlorhexidine Gluconate Cloth  6 each Topical Daily  . fluticasone  2 spray Each Nare Daily  . ipratropium  2 spray Each Nare  QHS  . midodrine  10 mg Oral TID WC  . pantoprazole (PROTONIX) IV  40 mg Intravenous Q12H  . sodium chloride flush  3 mL Intravenous Q12H   Continuous Infusions: . sodium chloride    . sodium chloride    . ampicillin-sulbactam (UNASYN) IV 200 mL/hr at 08/24/20 1200  . norepinephrine (LEVOPHED) Adult infusion 3 mcg/min (08/24/20 1200)   PRN Meds:.sodium chloride, acetaminophen, ipratropium-albuterol, sodium chloride flush  Allergies  Allergen Reactions  . Horse-Derived Products Anaphylaxis   Review of Systems  Constitutional: Positive for activity change, appetite change and unexpected weight change.    Physical Exam Vitals and nursing note reviewed.  Constitutional:      General: He is not in acute distress.    Comments: Frail  Pulmonary:     Effort: Pulmonary effort is normal.  Neurological:     Mental Status: He is  alert.     Motor: Weakness present.     Comments: Very HOH     Vital Signs: BP (!) 72/63 (BP Location: Right Arm)   Pulse (!) 104   Temp 98.1 F (36.7 C) (Oral)   Resp 20   Ht 6' 1"  (1.854 m)   Wt 69.4 kg   SpO2 97%   BMI 20.19 kg/m  Pain Scale: 0-10   Pain Score: 0-No pain   SpO2: SpO2: 97 % O2 Device:SpO2: 97 % O2 Flow Rate: .O2 Flow Rate (L/min): 10 L/min  IO: Intake/output summary:   Intake/Output Summary (Last 24 hours) at 08/24/2020 1330 Last data filed at 08/24/2020 1200 Gross per 24 hour  Intake 2083.78 ml  Output 250 ml  Net 1833.78 ml    LBM: Last BM Date: 08/24/2020 Baseline Weight: Weight: 72.1 kg Most recent weight: Weight: 69.4 kg     Palliative Assessment/Data: PPS: 20%     Thank you for this consult. Palliative medicine will continue to follow and assist as needed.   Time In: 1320 Time Out: 1423 Time Total: 63 mins Greater than 50%  of this time was spent counseling and coordinating care related to the above assessment and plan.  Signed by: Mariana Kaufman, AGNP-C Palliative Medicine    Please contact Palliative Medicine Team phone at [phone] for questions and concerns.  For individual provider: See Shea Evans

## 2020-08-24 NOTE — Consult Note (Signed)
Jeffery Smith is a 86 y.o. male  [id number]  Primary Cardiologist: Adrian Blackwater Reason for Consultation: HFrEF  HPI: Patient is a 86 year old male with a past medical history of combined HFrEF/HFpEF with a recent EF 30-35%, moderate AS, hyperlipidemia, and coronary artery disease presenting to the emergency department with worsening generalized weakness and rectal bleeding.  Patient recently treated with doxycycline for pneumonia.  Patient now presents with pneumonia sepsis and is currently in the ICU requiring vasopressors.  We have been consulted due to patient's history of combined HFrEF/HFpEF.  As an outpatient patient has been managed with enalapril 2.5 mg daily and metoprolol succinate 25 mg daily as he became hypotensive with carvedilol and losartan.   Review of Systems: Patient denies chest pain, shortness of breath but is requiring supplemental oxygen, PND, or orthopnea   Past Medical History:  Diagnosis Date  . Arthritis   . Benign head tremor   . Carotid artery occlusion    right side   . Dysrhythmia    irregular heart beat per patient- skips a beat   . Enlarged prostate   . GERD (gastroesophageal reflux disease)   . Hard of hearing   . Hyperlipidemia   . Sore throat     Medications Prior to Admission  Medication Sig Dispense Refill  . acetaminophen (TYLENOL) 325 MG tablet Take 650 mg by mouth every 6 (six) hours as needed for mild pain, fever or headache.    . Apoaequorin (PREVAGEN EXTRA STRENGTH PO) Take 1 tablet by mouth See admin instructions. Chew 1 tablet by mouth every evening    . aspirin EC 81 MG tablet Take 1 tablet (81 mg total) by mouth daily.    . cholecalciferol (VITAMIN D3) 25 MCG (1000 UNIT) tablet Take 1,000 Units by mouth daily.    Marland Kitchen doxycycline (VIBRAMYCIN) 100 MG capsule Take 100 mg by mouth 2 (two) times daily.    . enalapril (VASOTEC) 2.5 MG tablet Take 2.5 mg by mouth daily.    . fluticasone (FLONASE) 50 MCG/ACT nasal spray Place 2  sprays into both nostrils daily.    . furosemide (LASIX) 20 MG tablet Take 1 tablet (20 mg total) by mouth every other day. 15 tablet 2  . metoprolol succinate (TOPROL-XL) 25 MG 24 hr tablet Take 25 mg by mouth daily.    Marland Kitchen omeprazole (PRILOSEC) 20 MG capsule Take 20 mg by mouth at bedtime.     Marland Kitchen ipratropium (ATROVENT) 0.06 % nasal spray Place 2 sprays into both nostrils at bedtime. (Patient not taking: Reported on 08/28/2020)    . loratadine (CLARITIN) 10 MG tablet Take 10 mg by mouth daily. (Patient not taking: Reported on 08/21/2020)       . Chlorhexidine Gluconate Cloth  6 each Topical Daily  . fluticasone  2 spray Each Nare Daily  . ipratropium  2 spray Each Nare QHS  . midodrine  10 mg Oral TID WC  . pantoprazole (PROTONIX) IV  40 mg Intravenous Q12H  . sodium chloride flush  3 mL Intravenous Q12H    Infusions: . sodium chloride    . sodium chloride    . ampicillin-sulbactam (UNASYN) IV 3 g (08/24/20 0647)  . azithromycin Stopped (09/02/2020 1628)  . norepinephrine (LEVOPHED) Adult infusion 5 mcg/min (08/24/20 0600)    Allergies  Allergen Reactions  . Horse-Derived Products Anaphylaxis    Social History   Socioeconomic History  . Marital status: Married    Spouse name: Not on file  . Number  of children: Not on file  . Years of education: Not on file  . Highest education level: Not on file  Occupational History  . Not on file  Tobacco Use  . Smoking status: Never Smoker  . Smokeless tobacco: Never Used  Vaping Use  . Vaping Use: Never used  Substance and Sexual Activity  . Alcohol use: Yes    Comment: 1 glass of wine daily  . Drug use: No  . Sexual activity: Not on file  Other Topics Concern  . Not on file  Social History Narrative  . Not on file   Social Determinants of Health   Financial Resource Strain:   . Difficulty of Paying Living Expenses: Not on file  Food Insecurity:   . Worried About Programme researcher, broadcasting/film/video in the Last Year: Not on file  . Ran Out  of Food in the Last Year: Not on file  Transportation Needs:   . Lack of Transportation (Medical): Not on file  . Lack of Transportation (Non-Medical): Not on file  Physical Activity:   . Days of Exercise per Week: Not on file  . Minutes of Exercise per Session: Not on file  Stress:   . Feeling of Stress : Not on file  Social Connections:   . Frequency of Communication with Friends and Family: Not on file  . Frequency of Social Gatherings with Friends and Family: Not on file  . Attends Religious Services: Not on file  . Active Member of Clubs or Organizations: Not on file  . Attends Banker Meetings: Not on file  . Marital Status: Not on file  Intimate Partner Violence:   . Fear of Current or Ex-Partner: Not on file  . Emotionally Abused: Not on file  . Physically Abused: Not on file  . Sexually Abused: Not on file    Family History  Problem Relation Age of Onset  . Cancer Mother   . Cancer Brother     PHYSICAL EXAM: Vitals:   08/24/20 0630 08/24/20 0645  BP: 90/73 (!) 88/73  Pulse: (!) 102 (!) 102  Resp: 17 (!) 23  Temp:    SpO2: 96% 95%     Intake/Output Summary (Last 24 hours) at 08/24/2020 0919 Last data filed at 08/24/2020 0600 Gross per 24 hour  Intake 1842.22 ml  Output 250 ml  Net 1592.22 ml    General:  Well appearing. No respiratory difficulty HEENT: normal Neck: supple. no JVD. Carotids 2+ bilat; no bruits. No lymphadenopathy or thryomegaly appreciated. Cor: ST Lungs: Bibasilar fine crackles Abdomen: soft, nontender, nondistended. No hepatosplenomegaly. No bruits or masses. Good bowel sounds. Extremities: no cyanosis, clubbing, rash, edema Neuro: alert & oriented x 3, cranial nerves grossly intact. moves all 4 extremities w/o difficulty. Affect pleasant.  ECG: ST with RBBB and LVH.  112/BPM  Results for orders placed or performed during the hospital encounter of 09/06/20 (from the past 24 hour(s))  Respiratory Panel by RT PCR (Flu  A&B, Covid) - Nasopharyngeal Swab     Status: None   Collection Time: 2020-09-06 11:02 AM   Specimen: Nasopharyngeal Swab  Result Value Ref Range   SARS Coronavirus 2 by RT PCR NEGATIVE NEGATIVE   Influenza A by PCR NEGATIVE NEGATIVE   Influenza B by PCR NEGATIVE NEGATIVE  CBC     Status: Abnormal   Collection Time: 09-06-2020 11:15 AM  Result Value Ref Range   WBC 36.1 (H) 4.0 - 10.5 K/uL   RBC 4.51 4.22 -  5.81 MIL/uL   Hemoglobin 14.0 13.0 - 17.0 g/dL   HCT 03.8 39 - 52 %   MCV 92.2 80.0 - 100.0 fL   MCH 31.0 26.0 - 34.0 pg   MCHC 33.7 30.0 - 36.0 g/dL   RDW 88.2 80.0 - 34.9 %   Platelets 377 150 - 400 K/uL   nRBC 0.0 0.0 - 0.2 %  Comprehensive metabolic panel     Status: Abnormal   Collection Time: 09/19/2020 11:15 AM  Result Value Ref Range   Sodium 133 (L) 135 - 145 mmol/L   Potassium 5.1 3.5 - 5.1 mmol/L   Chloride 97 (L) 98 - 111 mmol/L   CO2 22 22 - 32 mmol/L   Glucose, Bld 114 (H) 70 - 99 mg/dL   BUN 39 (H) 8 - 23 mg/dL   Creatinine, Ser 1.79 (H) 0.61 - 1.24 mg/dL   Calcium 15.0 8.9 - 56.9 mg/dL   Total Protein 6.3 (L) 6.5 - 8.1 g/dL   Albumin 2.6 (L) 3.5 - 5.0 g/dL   AST 37 15 - 41 U/L   ALT 18 0 - 44 U/L   Alkaline Phosphatase 81 38 - 126 U/L   Total Bilirubin 1.3 (H) 0.3 - 1.2 mg/dL   GFR, Estimated 43 (L) >60 mL/min   Anion gap 14 5 - 15  Protime-INR     Status: None   Collection Time: September 19, 2020 11:15 AM  Result Value Ref Range   Prothrombin Time 13.7 11.4 - 15.2 seconds   INR 1.1 0.8 - 1.2  CBC with Differential/Platelet     Status: Abnormal   Collection Time: 09/19/20 11:15 AM  Result Value Ref Range   WBC 36.3 (H) 4.0 - 10.5 K/uL   RBC 4.49 4.22 - 5.81 MIL/uL   Hemoglobin 13.8 13.0 - 17.0 g/dL   HCT 79.4 39 - 52 %   MCV 92.9 80.0 - 100.0 fL   MCH 30.7 26.0 - 34.0 pg   MCHC 33.1 30.0 - 36.0 g/dL   RDW 80.1 65.5 - 37.4 %   Platelets 372 150 - 400 K/uL   nRBC 0.1 0.0 - 0.2 %   Neutrophils Relative % 91 %   Neutro Abs 33.0 (H) 1.7 - 7.7 K/uL    Lymphocytes Relative 2 %   Lymphs Abs 0.8 0.7 - 4.0 K/uL   Monocytes Relative 5 %   Monocytes Absolute 1.8 (H) 0.1 - 1.0 K/uL   Eosinophils Relative 0 %   Eosinophils Absolute 0.0 0.0 - 0.5 K/uL   Basophils Relative 0 %   Basophils Absolute 0.1 0.0 - 0.1 K/uL   WBC Morphology MORPHOLOGY UNREMARKABLE    RBC Morphology MORPHOLOGY UNREMARKABLE    Smear Review Normal platelet morphology    Immature Granulocytes 2 %   Abs Immature Granulocytes 0.71 (H) 0.00 - 0.07 K/uL  Procalcitonin - Baseline     Status: None   Collection Time: 2020/09/19 11:21 AM  Result Value Ref Range   Procalcitonin 0.53 ng/mL  Lactic acid, plasma     Status: None   Collection Time: Sep 19, 2020 12:00 PM  Result Value Ref Range   Lactic Acid, Venous 1.6 0.5 - 1.9 mmol/L  Blood culture (routine x 2)     Status: None (Preliminary result)   Collection Time: Sep 19, 2020 12:06 PM   Specimen: BLOOD  Result Value Ref Range   Specimen Description BLOOD RIGHT FA    Special Requests      BOTTLES DRAWN AEROBIC AND ANAEROBIC Blood Culture adequate  volume   Culture      NO GROWTH < 24 HOURS Performed at [facility], [address] [place], [place] [address]    Report Status PENDING   Blood culture (routine x 2)     Status: None (Preliminary result)   Collection Time: 08/31/2020 12:06 PM   Specimen: BLOOD  Result Value Ref Range   Specimen Description BLOOD LEFT AC    Special Requests      BOTTLES DRAWN AEROBIC AND ANAEROBIC Blood Culture adequate volume   Culture      NO GROWTH < 24 HOURS Performed at [facility], [address] [place], [place] [address]    Report Status PENDING   APTT     Status: None   Collection Time: 09/06/2020  1:15 PM  Result Value Ref Range   aPTT 28 24 - 36 seconds  Blood gas, arterial     Status: Abnormal   Collection Time: 08/11/2020  6:27 PM  Result Value Ref Range   FIO2 1.00    Delivery systems NON-REBREATHER OXYGEN MASK    pH, Arterial 7.45 7.35 - 7.45   pCO2  arterial 32 32 - 48 mmHg   pO2, Arterial 78 (L) 83 - 108 mmHg   Bicarbonate 22.2 20.0 - 28.0 mmol/L   Acid-base deficit 1.1 0.0 - 2.0 mmol/L   O2 Saturation 96.0 %   Patient temperature 37.0    Collection site RIGHT RADIAL    Sample type ARTERIAL DRAW    Allens test (pass/fail) PASS PASS  Vitamin B12     Status: Abnormal   Collection Time: 08/14/2020  7:47 PM  Result Value Ref Range   Vitamin B-12 136 (L) 180 - 914 pg/mL  Folate     Status: None   Collection Time: 08/12/2020  7:47 PM  Result Value Ref Range   Folate 8.4 >5.9 ng/mL  Iron and TIBC     Status: Abnormal   Collection Time: 08/13/2020  7:47 PM  Result Value Ref Range   Iron 24 (L) 45 - 182 ug/dL   TIBC 562146 (L) 130250 - 865450 ug/dL   Saturation Ratios 17 (L) 17.9 - 39.5 %   UIBC 122 ug/dL  Ferritin     Status: Abnormal   Collection Time: 08/22/2020  7:47 PM  Result Value Ref Range   Ferritin 386 (H) 24 - 336 ng/mL  Reticulocytes     Status: Abnormal   Collection Time: 08/19/2020  7:47 PM  Result Value Ref Range   Retic Ct Pct 2.2 0.4 - 3.1 %   RBC. 3.76 (L) 4.22 - 5.81 MIL/uL   Retic Count, Absolute 81.2 19.0 - 186.0 K/uL   Immature Retic Fract 15.6 2.3 - 15.9 %  T4, free     Status: Abnormal   Collection Time: 08/09/2020  7:47 PM  Result Value Ref Range   Free T4 1.31 (H) 0.61 - 1.12 ng/dL  Type and screen     Status: None   Collection Time: 08/28/2020  7:47 PM  Result Value Ref Range   ABO/RH(D) A POS    Antibody Screen NEG    Sample Expiration      08/26/2020,2359 Performed at [facility], [address] [place], [place] [address]   Glucose, capillary     Status: Abnormal   Collection Time: 08/28/2020  9:21 PM  Result Value Ref Range   Glucose-Capillary 109 (H) 70 - 99 mg/dL  MRSA PCR Screening     Status: None  Collection Time: 08/28/2020  9:50 PM   Specimen: Nasal Mucosa; Nasopharyngeal  Result Value Ref Range   MRSA by PCR NEGATIVE NEGATIVE  Strep pneumoniae urinary antigen     Status: None   Collection  Time: 08/24/20 12:02 AM  Result Value Ref Range   Strep Pneumo Urinary Antigen NEGATIVE NEGATIVE  Basic metabolic panel     Status: Abnormal   Collection Time: 08/24/20  5:37 AM  Result Value Ref Range   Sodium 136 135 - 145 mmol/L   Potassium 4.3 3.5 - 5.1 mmol/L   Chloride 102 98 - 111 mmol/L   CO2 23 22 - 32 mmol/L   Glucose, Bld 123 (H) 70 - 99 mg/dL   BUN 32 (H) 8 - 23 mg/dL   Creatinine, Ser 1.61 (H) 0.61 - 1.24 mg/dL   Calcium 9.6 8.9 - 09.6 mg/dL   GFR, Estimated 48 (L) >60 mL/min   Anion gap 11 5 - 15  CBC     Status: Abnormal   Collection Time: 08/24/20  5:37 AM  Result Value Ref Range   WBC 31.9 (H) 4.0 - 10.5 K/uL   RBC 4.07 (L) 4.22 - 5.81 MIL/uL   Hemoglobin 12.5 (L) 13.0 - 17.0 g/dL   HCT 04.5 (L) 39 - 52 %   MCV 91.6 80.0 - 100.0 fL   MCH 30.7 26.0 - 34.0 pg   MCHC 33.5 30.0 - 36.0 g/dL   RDW 40.9 81.1 - 91.4 %   Platelets 317 150 - 400 K/uL   nRBC 0.0 0.0 - 0.2 %  Procalcitonin     Status: None   Collection Time: 08/24/20  5:37 AM  Result Value Ref Range   Procalcitonin 0.29 ng/mL  Magnesium     Status: None   Collection Time: 08/24/20  5:37 AM  Result Value Ref Range   Magnesium 2.2 1.7 - 2.4 mg/dL   CT ABDOMEN PELVIS W CONTRAST  Result Date: 09/06/2020 CLINICAL DATA:  86 year old male with history of acute onset of nonlocalized abdominal pain. Blood from rectum. EXAM: CT ABDOMEN AND PELVIS WITH CONTRAST TECHNIQUE: Multidetector CT imaging of the abdomen and pelvis was performed using the standard protocol following bolus administration of intravenous contrast. CONTRAST:  75mL OMNIPAQUE IOHEXOL 300 MG/ML  SOLN COMPARISON:  No priors. FINDINGS: Lower chest: Electronic devices in the subcutaneous fat of the lower back causing extensive beam hardening artifact. Small right pleural effusion lying dependently. Patchy airspace consolidation in the right lower lobe. Patchy peribronchovascular ground-glass attenuation and septal thickening also noted in the lower  lobes of the lungs bilaterally. Aortic atherosclerosis. Small hiatal hernia. Hepatobiliary: 1.5 cm low-attenuation lesion in the central aspect of the right lobe of the liver (axial image 18 of series 2), compatible with a simple cyst. Several other tiny 2-3 mm low-attenuation lesions noted in the liver, too small to characterize, but statistically likely to represent tiny cysts or biliary hamartomas. No definite suspicious hepatic lesions. No intra or extrahepatic biliary ductal dilatation. Trace amount of high attenuation material lying dependently in the gallbladder, likely to reflect biliary sludge or tiny noncalcified gallstones. No findings to suggest an acute cholecystitis at this time. Pancreas: No pancreatic mass. No pancreatic ductal dilatation. No pancreatic or peripancreatic fluid collections or inflammatory changes. Spleen: Unremarkable. Adrenals/Urinary Tract: 2 mm nonobstructive calculus in the lower pole collecting system of the left kidney. Bilateral kidneys and adrenal glands are otherwise normal in appearance. No hydroureteronephrosis. Urinary bladder is normal in appearance. Stomach/Bowel: Normal appearance of the  stomach. No pathologic dilatation of small bowel or colon. Masslike mural thickening in the distal rectum where there is also some mucosal hyperenhancement, best appreciated on axial images 83-86 of series 2. Numerous colonic diverticulae are noted, without surrounding inflammatory changes to suggest an acute diverticulitis at this time. Vascular/Lymphatic: Aortic atherosclerosis, without evidence of aneurysm or dissection in the abdominal or pelvic vasculature. No lymphadenopathy noted in the abdomen or pelvis. Reproductive: Prostate gland and seminal vesicles are unremarkable in appearance. Other: No significant volume of ascites.  No pneumoperitoneum. Musculoskeletal: Chronic appearing T12 compression fracture with 30% loss of anterior vertebral body height. There are no aggressive  appearing lytic or blastic lesions noted in the visualized portions of the skeleton. IMPRESSION: 1. Mass-like mural thickening of the distal rectum with some associated mucosal hyperenhancement. Correlation with sigmoidoscopy is suggested to exclude the possibility of infiltrative neoplasm. 2. Areas of airspace consolidation in the lower lobes of the lungs bilaterally (right greater than left), concerning for multilobar pneumonia. 3. Small right parapneumonic pleural effusion. 4. Cholelithiasis or biliary sludge lying dependently in the gallbladder, without evidence of acute cholecystitis at this time. 5. Colonic diverticulosis. 6. 2 mm nonobstructive calculus in the lower pole collecting system of the left kidney. 7. Aortic atherosclerosis. 8. Additional incidental findings, as above. Electronically Signed   By: Trudie Reed M.D.   On: 2020-08-27 13:08   DG Chest Port 1 View  Result Date: 08/24/2020 CLINICAL DATA:  Respiratory failure.  Hypoxia. EXAM: PORTABLE CHEST 1 VIEW COMPARISON:  08/27/20.  07/12/2020. FINDINGS: Stable cardiomegaly. Diffuse severe bilateral pulmonary infiltrates again noted without interim change. Small wedge-shaped densities noted over the right upper and left lower lungs. Although these changes may be secondary to atelectasis, pulmonary infarcts cannot be excluded. No prominent pleural effusion. No pneumothorax. Degenerative change thoracic spine. IMPRESSION: 1. Diffuse severe bilateral pulmonary infiltrates again noted without interim change. 2. Small wedge-shaped densities noted over the right upper and left lower lungs. Although these changes may be secondary to atelectasis, pulmonary infarcts cannot be excluded. 3.  Stable cardiomegaly. Electronically Signed   By: Maisie Fus  Register   On: 08/24/2020 07:09   DG Chest Portable 1 View  Result Date: Aug 27, 2020 CLINICAL DATA:  Shortness of breath.  Patient with life vest. EXAM: PORTABLE CHEST 1 VIEW COMPARISON:  Earlier today  FINDINGS: Hardware associated with patient's life vest is again noted. Stable cardiomediastinal silhouette. Diffuse bilateral interstitial and airspace opacities are again noted throughout both lungs. When compared with the previous exam there is been worsening opacification of the right mid lung. IMPRESSION: 1. Diffuse bilateral interstitial and airspace opacities are again noted with worsening aeration to the right midlung. Electronically Signed   By: Signa Kell M.D.   On: 08-27-20 18:07   DG Chest Port 1 View  Result Date: 2020/08/27 CLINICAL DATA:  Rectal bleeding and diarrhea. Concern for potential sepsis EXAM: PORTABLE CHEST 1 VIEW COMPARISON:  July 12, 2020 FINDINGS: There is airspace opacity throughout multiple sites in the right lung. There is underlying hyperexpansion, stable. There is diffuse interstitial thickening throughout the lungs, stable. Heart size and pulmonary vascular normal. No adenopathy. There is aortic atherosclerosis. No bone lesions. IMPRESSION: Multifocal areas of airspace opacity consistent with multifocal pneumonia throughout right lung. Underlying interstitial fibrosis and mild hyperexpansion. Heart size normal. No adenopathy evident. Aortic Atherosclerosis (ICD10-I70.0). Electronically Signed   By: Bretta Bang III M.D.   On: August 27, 2020 13:49     ASSESSMENT AND PLAN: Patient presenting to the emergency department  with progressive generalized weakness found to have pneumonia sepsis.  Please continue to hold any outpatient antihypertensives, enalapril and metoprolol, as patient continues to require norepinephrine.  We will repeat echocardiogram in the setting of sepsis.  Patient with improving AKI.  Please avoid further fluid resuscitation if possible with patient's current HFrEF and aortic stenosis.  Patient's presentation is most likely due to pneumonia and not acute exacerbation of HFrEF/HFpEF.  We will continue to follow.  Maryelizabeth Kaufmann NP-C

## 2020-08-24 NOTE — Consult Note (Signed)
Jeffery Darby, MD [address]  [place]  [place], [place] [address]  Main: [phone]  Fax: [phone] Pager: [phone]   Consultation  Referring Provider:     No ref. provider found Primary Care Physician:  System, Provider Not In Primary Gastroenterologist: Unassigned         Reason for Consultation:     Rectal bleeding, abnormal CT  Date of Admission:  08/26/2020 Date of Consultation:  08/24/2020         HPI:   Jeffery Smith is a 86 y.o. male with medical history significant fordiastolic heart failure, last EF 60-65 in 07/2019, carotid artery stenosis status post endarterectomy, moderate to severe aortic stenosis who was recently in the ER on 11/13 secondary to severe constipation, abdominal pain and rectal pressure, confirmed moderate stool burden in left colon, underwent manual disimpaction in the ER followed by enema and was discharged home on MiraLAX as patient felt better.  Patient returned to the ER yesterday secondary to rectal bleeding that started on Saturday followed by several bloody bowel movements, was also hypotensive.  Rectal exam in fact revealed light brown stool, received IV fluids, underwent CT abdomen and pelvis with contrast which revealed thickening of the distal rectum as well as bilateral pneumonia, significant leukocytosis WBC count 36K, was initiated on sepsis protocol, SARS COVID-19 was negative, transferred to ICU in setting of septic shock from pneumonia.  Since transfer to the ICU, patient did not have any further episodes of rectal bleeding per ICU nurse.  His hemoglobin at baseline is between 13 and 14, Decreased to 12.5 today.  Patient is also started on Unasyn, WBC count reduced to 31.9 today.  He is also on Levophed currently.  He reports poor p.o. intake, denies any abdominal pain, nausea or vomiting, denies rectal pain Patient was hospitalized in September as well as in October secondary to acute on chronic CHF and pneumonia.   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  He used to also drive tractor and train dogs.  He has been on 4 wheeler since last hospitalization.     NSAIDs: None  Antiplts/Anticoagulants/Anti thrombotics: Aspirin 81  GI Procedures: Colonoscopy in 2008, images were reviewed, retroflexion in the rectum as well as rest of the colon images appeared normal  Past Medical History:  Diagnosis Date  . Arthritis   . Benign head tremor   . Carotid artery occlusion    right side   . Dysrhythmia    irregular heart beat per patient- skips a beat   . Enlarged prostate   . GERD (gastroesophageal reflux disease)   . Hard of hearing   . Hyperlipidemia   . Sore throat     Past Surgical History:  Procedure Laterality Date  . CATARACT EXTRACTION     left  . ENDARTERECTOMY  11/03/2011   Procedure: ENDARTERECTOMY CAROTID;  Surgeon: Rosetta Posner, MD;  Location: [facility] OR;  Service: Vascular;  Laterality: Right;  Right Carotid endarterectomy with patch angiopolasty  . TONSILLECTOMY    . TOTAL KNEE ARTHROPLASTY     left  . TOTAL KNEE ARTHROPLASTY Right 09/21/2014   Procedure: RIGHT TOTAL KNEE ARTHROPLASTY;  Surgeon: Gearlean Alf, MD;  Location: [facility] ORS;  Service: Orthopedics;  Laterality: Right;    Current Facility-Administered Medications:  .  0.9 %  sodium chloride infusion, 250 mL, Intravenous, PRN, Agbata, Tochukwu, MD .  0.9 %  sodium chloride infusion, 250 mL, Intravenous, Continuous, Patel, Ekta V, MD .  acetaminophen (TYLENOL) tablet 650 mg, 650 mg, Oral, Q6H PRN, Agbata, Tochukwu, MD .  Ampicillin-Sulbactam (UNASYN) 3 g in sodium chloride 0.9 % 100 mL IVPB, 3 g, Intravenous, Q6H, Oswald Hillock, RPH, Last Rate: 200 mL/hr at 08/24/20 1200, Rate Verify at 08/24/20 1200 .  Chlorhexidine Gluconate Cloth 2 % PADS 6 each, 6 each, Topical, Daily, Agbata,  Tochukwu, MD, 6 each at 08/24/20 1043 .  fluticasone (FLONASE) 50 MCG/ACT nasal spray 2 spray, 2 spray, Each Nare, Daily, Agbata, Tochukwu, MD, 2 spray at 08/24/20 1136 .  ipratropium (ATROVENT) 0.06 % nasal spray 2 spray, 2 spray, Each Nare, QHS, Agbata, Tochukwu, MD .  ipratropium-albuterol (DUONEB) 0.5-2.5 (3) MG/3ML nebulizer solution 3 mL, 3 mL, Nebulization, Q6H PRN, Agbata, Tochukwu, MD, 3 mL at 08/26/2020 1705 .  midodrine (PROAMATINE) tablet 10 mg, 10 mg, Oral, TID WC, Kasa, Kurian, MD, 10 mg at 08/24/20 1136 .  norepinephrine (LEVOPHED) 77m in 2558mpremix infusion, 2-10 mcg/min, Intravenous, Titrated, PaFlorina Ou, MD, Last Rate: 11.25 mL/hr at 08/24/20 1344, 3 mcg/min at 08/24/20 1344 .  pantoprazole (PROTONIX) injection 40 mg, 40 mg, Intravenous, Q12H, PaFlorina Ou, MD, 40 mg at 08/24/20 1031 .  polyethylene glycol (MIRALAX / GLYCOLAX) packet 17 g, 17 g, Oral, Daily, Mattis Featherly, RoTally DueMD .  sodium chloride flush (NS) 0.9 % injection 3 mL, 3 mL, Intravenous, Q12H, Agbata, Tochukwu, MD, 3 mL at 08/24/20 1044 .  sodium chloride flush (NS) 0.9 % injection 3 mL, 3 mL, Intravenous, PRN, Agbata, Tochukwu, MD   Family History  Problem Relation Age of Onset  . Cancer Mother   . Cancer Brother      Social History   Tobacco Use  . Smoking status: Never Smoker  . Smokeless tobacco: Never Used  Vaping Use  . Vaping Use: Never used  Substance Use Topics  . Alcohol use: Yes    Comment: 1 glass of wine daily  . Drug use: No    Allergies as of 08/09/2020 - Review Complete 08/12/2020  Allergen Reaction Noted  . Horse-derived products Anaphylaxis 10/25/2011    Review of Systems:    All systems reviewed and negative except where noted in HPI.   Physical Exam:  Vital signs in last 24 hours: Temp:  [98 F (36.7 C)-98.9 F (37.2 C)] 98.1 F (36.7 C) (11/16 1200) Pulse Rate:  [98-127] 104 (11/16 1200) Resp:  [15-29] 20 (11/16 1200) BP: (65-106)/(45-77) 72/63 (11/16 1200) SpO2:   [88 %-100 %] 97 % (11/16 1200) Weight:  [69.4 kg] 69.4 kg (11/16 0500) Last BM Date: 09/06/2020 General:   Pleasant, cooperative in NAD, thin built, ill-appearing Head:  Normocephalic and atraumatic. Eyes:   No icterus.   Conjunctiva pink. PERRLA. Ears:  Normal auditory acuity. Neck:  Supple; no masses or thyroidomegaly Lungs: Respirations even and unlabored. Lungs clear to auscultation bilaterally.   No wheezes, crackles, or rhonchi.  Heart: Tachycardia,  Without murmur, clicks, rubs or gallops Abdomen:  Soft, nondistended, nontender. Normal bowel sounds. No appreciable masses or hepatomegaly.  No rebound or guarding.  Rectal:  Not performed. Msk:  Symmetrical without gross deformities.  Extremities:  Without edema, cyanosis or clubbing. Neurologic:  Alert and oriented x3;  grossly normal neurologically. Skin:  Intact without significant lesions or rashes. Cervical Nodes:  No significant cervical adenopathy. Psych:  Alert  and cooperative. Normal affect.  LAB RESULTS: CBC Latest Ref Rng & Units 08/24/2020 09/04/2020 08/17/2020  WBC 4.0 - 10.5 K/uL 31.9(H) 36.3(H) 36.1(H)  Hemoglobin 13.0 - 17.0 g/dL 12.5(L) 13.8 14.0  Hematocrit 39 - 52 % 37.3(L) 41.7 41.6  Platelets 150 - 400 K/uL 317 372 377    BMET BMP Latest Ref Rng & Units 08/24/2020 08/10/2020 08/21/2020  Glucose 70 - 99 mg/dL 123(H) 114(H) 118(H)  BUN 8 - 23 mg/dL 32(H) 39(H) 24(H)  Creatinine 0.61 - 1.24 mg/dL 1.38(H) 1.50(H) 1.34(H)  Sodium 135 - 145 mmol/L 136 133(L) 133(L)  Potassium 3.5 - 5.1 mmol/L 4.3 5.1 4.3  Chloride 98 - 111 mmol/L 102 97(L) 100  CO2 22 - 32 mmol/L _0 Calcium 8.9 - 10.3 mg/dL 9.6 10.0 9.8    LFT Hepatic Function Latest Ref Rng & Units 08/19/2020 07/12/2020 07/02/2020  Total Protein 6.5 - 8.1 g/dL 6.3(L) 6.0(L) 6.0(L)  Albumin 3.5 - 5.0 g/dL 2.6(L) 3.2(L) 3.3(L)  AST 15 - 41 U/L 37 13(L) 17  ALT 0 - 44 U/L _1 Alk Phosphatase 38 - 126 U/L 81 51 54  Total Bilirubin 0.3 - 1.2  mg/dL 1.3(H) 0.9 1.1     STUDIES: CT ABDOMEN PELVIS W CONTRAST  Result Date: 09/05/2020 CLINICAL DATA:  86 year old male with history of acute onset of nonlocalized abdominal pain. Blood from rectum. EXAM: CT ABDOMEN AND PELVIS WITH CONTRAST TECHNIQUE: Multidetector CT imaging of the abdomen and pelvis was performed using the standard protocol following bolus administration of intravenous contrast. CONTRAST:  60m OMNIPAQUE IOHEXOL 300 MG/ML  SOLN COMPARISON:  No priors. FINDINGS: Lower chest: Electronic devices in the subcutaneous fat of the lower back causing extensive beam hardening artifact. Small right pleural effusion lying dependently. Patchy airspace consolidation in the right lower lobe. Patchy peribronchovascular ground-glass attenuation and septal thickening also noted in the lower lobes of the lungs bilaterally. Aortic atherosclerosis. Small hiatal hernia. Hepatobiliary: 1.5 cm low-attenuation lesion in the central aspect of the right lobe of the liver (axial image 18 of series 2), compatible with a simple cyst. Several other tiny 2-3 mm low-attenuation lesions noted in the liver, too small to characterize, but statistically likely to represent tiny cysts or biliary hamartomas. No definite suspicious hepatic lesions. No intra or extrahepatic biliary ductal dilatation. Trace amount of high attenuation material lying dependently in the gallbladder, likely to reflect biliary sludge or tiny noncalcified gallstones. No findings to suggest an acute cholecystitis at this time. Pancreas: No pancreatic mass. No pancreatic ductal dilatation. No pancreatic or peripancreatic fluid collections or inflammatory changes. Spleen: Unremarkable. Adrenals/Urinary Tract: 2 mm nonobstructive calculus in the lower pole collecting system of the left kidney. Bilateral kidneys and adrenal glands are otherwise normal in appearance. No hydroureteronephrosis. Urinary bladder is normal in appearance. Stomach/Bowel: Normal  appearance of the stomach. No pathologic dilatation of small bowel or colon. Masslike mural thickening in the distal rectum where there is also some mucosal hyperenhancement, best appreciated on axial images 83-86 of series 2. Numerous colonic diverticulae are noted, without surrounding inflammatory changes to suggest an acute diverticulitis at this time. Vascular/Lymphatic: Aortic atherosclerosis, without evidence of aneurysm or dissection in the abdominal or pelvic vasculature. No lymphadenopathy noted in the abdomen or pelvis. Reproductive: Prostate gland and seminal vesicles are unremarkable in appearance. Other: No significant volume of ascites.  No pneumoperitoneum. Musculoskeletal: Chronic appearing T12 compression fracture with 30% loss of anterior vertebral body height. There are no aggressive appearing lytic or  blastic lesions noted in the visualized portions of the skeleton. IMPRESSION: 1. Mass-like mural thickening of the distal rectum with some associated mucosal hyperenhancement. Correlation with sigmoidoscopy is suggested to exclude the possibility of infiltrative neoplasm. 2. Areas of airspace consolidation in the lower lobes of the lungs bilaterally (right greater than left), concerning for multilobar pneumonia. 3. Small right parapneumonic pleural effusion. 4. Cholelithiasis or biliary sludge lying dependently in the gallbladder, without evidence of acute cholecystitis at this time. 5. Colonic diverticulosis. 6. 2 mm nonobstructive calculus in the lower pole collecting system of the left kidney. 7. Aortic atherosclerosis. 8. Additional incidental findings, as above. Electronically Signed   By: Vinnie Langton M.D.   On: 09/01/2020 13:08   DG Chest Port 1 View  Result Date: 08/24/2020 CLINICAL DATA:  Respiratory failure.  Hypoxia. EXAM: PORTABLE CHEST 1 VIEW COMPARISON:  09/04/2020.  07/12/2020. FINDINGS: Stable cardiomegaly. Diffuse severe bilateral pulmonary infiltrates again noted without  interim change. Small wedge-shaped densities noted over the right upper and left lower lungs. Although these changes may be secondary to atelectasis, pulmonary infarcts cannot be excluded. No prominent pleural effusion. No pneumothorax. Degenerative change thoracic spine. IMPRESSION: 1. Diffuse severe bilateral pulmonary infiltrates again noted without interim change. 2. Small wedge-shaped densities noted over the right upper and left lower lungs. Although these changes may be secondary to atelectasis, pulmonary infarcts cannot be excluded. 3.  Stable cardiomegaly. Electronically Signed   By: Marcello Moores  Register   On: 08/24/2020 07:09   DG Chest Portable 1 View  Result Date: 08/16/2020 CLINICAL DATA:  Shortness of breath.  Patient with life vest. EXAM: PORTABLE CHEST 1 VIEW COMPARISON:  Earlier today FINDINGS: Hardware associated with patient's life vest is again noted. Stable cardiomediastinal silhouette. Diffuse bilateral interstitial and airspace opacities are again noted throughout both lungs. When compared with the previous exam there is been worsening opacification of the right mid lung. IMPRESSION: 1. Diffuse bilateral interstitial and airspace opacities are again noted with worsening aeration to the right midlung. Electronically Signed   By: Kerby Moors M.D.   On: 08/25/2020 18:07   DG Chest Port 1 View  Result Date: 08/30/2020 CLINICAL DATA:  Rectal bleeding and diarrhea. Concern for potential sepsis EXAM: PORTABLE CHEST 1 VIEW COMPARISON:  July 12, 2020 FINDINGS: There is airspace opacity throughout multiple sites in the right lung. There is underlying hyperexpansion, stable. There is diffuse interstitial thickening throughout the lungs, stable. Heart size and pulmonary vascular normal. No adenopathy. There is aortic atherosclerosis. No bone lesions. IMPRESSION: Multifocal areas of airspace opacity consistent with multifocal pneumonia throughout right lung. Underlying interstitial fibrosis and  mild hyperexpansion. Heart size normal. No adenopathy evident. Aortic Atherosclerosis (ICD10-I70.0). Electronically Signed   By: Lowella Grip III M.D.   On: 08/22/2020 13:49      Impression / Plan:   Jeffery Smith is a 86 y.o. pleasant Caucasian male with CHF, cardiomyopathy EF of 35 to 97%, diastolic heart failure, history of carotid endarterectomy, moderate to severe aortic stenosis, history of pneumonia in September 2021, recent severe constipation, impacted stool s/p manual disimpaction on 11/11 is admitted with septic shock and rectal bleeding  Rectal bleeding: Self-limited, and hemodynamically insignificant, most likely secondary to bleeding from stercoral ulcer as evident by CT scan which revealed Mass-like mural thickening of the distal rectum with some associated mucosal hyperenhancement.  Stercoral ulcer is seen in setting of severe constipation and impacted stool in the rectum secondary to pressure necrosis/ischemia Patient is too unstable to undergo sigmoidoscopy  at this time.  This can be pursued either closer to discharge or as outpatient or sooner if patient develops recurrence of rectal bleeding to primarily rule out any rectal cancer Recommend stool softener such as MiraLAX daily to have 1-2 soft bowel movements/day  Septic shock secondary to pneumonia Currently on Unasyn and Levophed AKI is improving  Thank you for involving me in the care of this patient.      LOS: 1 day   Sherri Sear, MD  08/24/2020, 1:59 PM   Note: This dictation was prepared with Dragon dictation along with smaller phrase technology. Any transcriptional errors that result from this process are unintentional.

## 2020-08-24 NOTE — Progress Notes (Signed)
Initial Nutrition Assessment  DOCUMENTATION CODES:   Severe malnutrition in context of chronic illness  INTERVENTION:  Provide Ensure Enlive po TID, each supplement provides 350 kcal and 20 grams of protein.  Provide MVI po daily.  NUTRITION DIAGNOSIS:   Severe Malnutrition related to chronic illness (CHF, suspected inadequate oral intake) as evidenced by severe fat depletion, severe muscle depletion, 17.3% weight loss over 10 months.  GOAL:   Patient will meet greater than or equal to 90% of their needs  MONITOR:   PO intake, Supplement acceptance, Labs, Weight trends, I & O's  REASON FOR ASSESSMENT:   Malnutrition Screening Tool    ASSESSMENT:   86 year old male with PMHx of arthritis, HLD, GERD, systolic and diastolic HF with LifeVest, who is hard of hearing admitted with failure to thrive/malnutrition, septic shock secondary to PNA.   Met with patient at bedside. Patient is a poor historian. He reports he ate well PTA but is unable to provide any details. Following SLP evaluation diet was advanced to dysphagia 3 with thin liquids. He is unable to recall how he ate at lunch today. Per chart he ate 40% of meal. Per RN it was only the tomato soup, though. Dinner tray had arrived at time of RD assessment. Patient is amenable to drinking Ensure to help meet calorie/protein needs.  Patient unable to provide any details on weight history. He does report his UBW was 180 lbs at one point. Per review of chart patient was 83.9 kg on 10/15/2019. He is now 69.4 kg (153 lbs). That is a weight loss of 14.5 kg (17.3% body weight) over the past 10 months, which is significant for time frame.  Medications reviewed and include: Protonix, Miralax, Unasyn, norepinephrine gtt at 3 mcg/min.  Labs reviewed: BUN 32, Creatinine 1.38.  NUTRITION - FOCUSED PHYSICAL EXAM:    Most Recent Value  Orbital Region Severe depletion  Upper Arm Region Severe depletion  Thoracic and Lumbar Region Severe  depletion  Buccal Region Severe depletion  Temple Region Severe depletion  Clavicle Bone Region Severe depletion  Clavicle and Acromion Bone Region Severe depletion  Scapular Bone Region Unable to assess  Dorsal Hand Severe depletion  Patellar Region Moderate depletion  Anterior Thigh Region Moderate depletion  Posterior Calf Region Severe depletion  Edema (RD Assessment) None  Hair Reviewed  Eyes Reviewed  Mouth Reviewed  Skin Reviewed  Nails Reviewed     Diet Order:   Diet Order            DIET DYS 3 Room service appropriate? Yes with Assist; Fluid consistency: Thin  Diet effective now                EDUCATION NEEDS:   No education needs have been identified at this time  Skin:  Skin Assessment: Reviewed RN Assessment  Last BM:  08/22/2020  Height:   Ht Readings from Last 1 Encounters:  08/19/2020 6' 1" (1.854 m)   Weight:   Wt Readings from Last 1 Encounters:  08/24/20 69.4 kg   BMI:  Body mass index is 20.19 kg/m.  Estimated Nutritional Needs:   Kcal:  1800-2000  Protein:  90-100 grams  Fluid:  1.8 L/day  Jacklynn Barnacle, MS, RD, LDN Pager number available on Amion

## 2020-08-24 NOTE — Progress Notes (Signed)
*  PRELIMINARY RESULTS* Echocardiogram 2D Echocardiogram has been performed.  Jeffery Smith 08/24/2020, 3:31 PM

## 2020-08-25 DIAGNOSIS — K626 Ulcer of anus and rectum: Secondary | ICD-10-CM | POA: Diagnosis not present

## 2020-08-25 DIAGNOSIS — J690 Pneumonitis due to inhalation of food and vomit: Secondary | ICD-10-CM | POA: Diagnosis not present

## 2020-08-25 DIAGNOSIS — R6521 Severe sepsis with septic shock: Secondary | ICD-10-CM | POA: Diagnosis not present

## 2020-08-25 DIAGNOSIS — K625 Hemorrhage of anus and rectum: Secondary | ICD-10-CM | POA: Diagnosis not present

## 2020-08-25 DIAGNOSIS — A419 Sepsis, unspecified organism: Secondary | ICD-10-CM | POA: Diagnosis not present

## 2020-08-25 DIAGNOSIS — E43 Unspecified severe protein-calorie malnutrition: Secondary | ICD-10-CM | POA: Insufficient documentation

## 2020-08-25 DIAGNOSIS — I5023 Acute on chronic systolic (congestive) heart failure: Secondary | ICD-10-CM | POA: Diagnosis not present

## 2020-08-25 LAB — BASIC METABOLIC PANEL
Anion gap: 11 (ref 5–15)
BUN: 26 mg/dL — ABNORMAL HIGH (ref 8–23)
CO2: 23 mmol/L (ref 22–32)
Calcium: 9.4 mg/dL (ref 8.9–10.3)
Chloride: 102 mmol/L (ref 98–111)
Creatinine, Ser: 1.27 mg/dL — ABNORMAL HIGH (ref 0.61–1.24)
GFR, Estimated: 53 mL/min — ABNORMAL LOW (ref >60)
Glucose, Bld: 142 mg/dL — ABNORMAL HIGH (ref 70–99)
Potassium: 4 mmol/L (ref 3.5–5.1)
Sodium: 136 mmol/L (ref 135–145)

## 2020-08-25 LAB — CBC
HCT: 36.3 % — ABNORMAL LOW (ref 39.0–52.0)
Hemoglobin: 11.9 g/dL — ABNORMAL LOW (ref 13.0–17.0)
MCH: 30.9 pg (ref 26.0–34.0)
MCHC: 32.8 g/dL (ref 30.0–36.0)
MCV: 94.3 fL (ref 80.0–100.0)
Platelets: 266 10*3/uL (ref 150–400)
RBC: 3.85 MIL/uL — ABNORMAL LOW (ref 4.22–5.81)
RDW: 14 % (ref 11.5–15.5)
WBC: 19.6 10*3/uL — ABNORMAL HIGH (ref 4.0–10.5)
nRBC: 0 % (ref 0.0–0.2)

## 2020-08-25 LAB — LEGIONELLA PNEUMOPHILA SEROGP 1 UR AG: L. pneumophila Serogp 1 Ur Ag: NEGATIVE

## 2020-08-25 LAB — HIV ANTIBODY (ROUTINE TESTING W REFLEX): HIV Screen 4th Generation wRfx: NONREACTIVE

## 2020-08-25 LAB — PROCALCITONIN: Procalcitonin: 0.26 ng/mL

## 2020-08-25 LAB — BRAIN NATRIURETIC PEPTIDE: B Natriuretic Peptide: 1951.8 pg/mL — ABNORMAL HIGH (ref 0.0–100.0)

## 2020-08-25 MED ORDER — CYANOCOBALAMIN 1000 MCG/ML IJ SOLN
1000.0000 ug | Freq: Once | INTRAMUSCULAR | Status: AC
  Administered 2020-08-25: 1000 ug via INTRAMUSCULAR
  Filled 2020-08-25 (×2): qty 1

## 2020-08-25 MED ORDER — SODIUM CHLORIDE 0.9 % IV SOLN
3.0000 g | Freq: Four times a day (QID) | INTRAVENOUS | Status: DC
  Administered 2020-08-25 – 2020-08-26 (×4): 3 g via INTRAVENOUS
  Filled 2020-08-25 (×2): qty 8
  Filled 2020-08-25: qty 3
  Filled 2020-08-25 (×4): qty 8
  Filled 2020-08-25: qty 3

## 2020-08-25 MED ORDER — FUROSEMIDE 10 MG/ML IJ SOLN
4.0000 mg/h | INTRAVENOUS | Status: DC
  Administered 2020-08-25: 4 mg/h via INTRAVENOUS
  Filled 2020-08-25: qty 20

## 2020-08-25 MED ORDER — ALBUMIN HUMAN 25 % IV SOLN
25.0000 g | Freq: Once | INTRAVENOUS | Status: AC
  Administered 2020-08-25: 25 g via INTRAVENOUS
  Filled 2020-08-25: qty 100

## 2020-08-25 MED ORDER — HALOPERIDOL LACTATE 5 MG/ML IJ SOLN
2.0000 mg | Freq: Four times a day (QID) | INTRAMUSCULAR | Status: DC | PRN
  Administered 2020-08-25 – 2020-08-28 (×4): 2 mg via INTRAVENOUS
  Filled 2020-08-25 (×4): qty 1

## 2020-08-25 MED ORDER — ENOXAPARIN SODIUM 40 MG/0.4ML ~~LOC~~ SOLN
40.0000 mg | SUBCUTANEOUS | Status: DC
  Administered 2020-08-25 – 2020-08-26 (×2): 40 mg via SUBCUTANEOUS
  Filled 2020-08-25 (×2): qty 0.4

## 2020-08-25 MED ORDER — VITAMIN B-12 1000 MCG PO TABS
1000.0000 ug | ORAL_TABLET | Freq: Every day | ORAL | Status: DC
  Administered 2020-08-26 – 2020-08-29 (×4): 1000 ug via ORAL
  Filled 2020-08-25 (×4): qty 1

## 2020-08-25 MED ORDER — HYDROCORTISONE NA SUCCINATE PF 100 MG IJ SOLR
50.0000 mg | Freq: Four times a day (QID) | INTRAMUSCULAR | Status: DC
  Administered 2020-08-25 – 2020-08-29 (×15): 50 mg via INTRAVENOUS
  Filled 2020-08-25 (×15): qty 2

## 2020-08-25 MED ORDER — FUROSEMIDE 10 MG/ML IJ SOLN
40.0000 mg | Freq: Once | INTRAMUSCULAR | Status: AC
  Administered 2020-08-25: 40 mg via INTRAVENOUS
  Filled 2020-08-25: qty 4

## 2020-08-25 NOTE — Progress Notes (Signed)
SUBJECTIVE: Patient resting in bed. Denies shortness of breath or chest pain. No acute events overnight per bedside RN.   Vitals:   08/25/20 1000 08/25/20 1015 08/25/20 1030 08/25/20 1045  BP: (!) 79/67 (!) 83/56 (!) 88/73 (!) 77/62  Pulse: (!) 55 98 (!) 104 96  Resp: (!) 22 (!) 21 19 15   Temp:      TempSrc:      SpO2: 90% 93% 95% 99%  Weight:      Height:        Intake/Output Summary (Last 24 hours) at 08/25/2020 1215 Last data filed at 08/25/2020 0800 Gross per 24 hour  Intake 632.68 ml  Output 300 ml  Net 332.68 ml    LABS: Basic Metabolic Panel: Recent Labs    08/24/20 0537 08/25/20 0659  NA 136 136  K 4.3 4.0  CL 102 102  CO2 23 23  GLUCOSE 123* 142*  BUN 32* 26*  CREATININE 1.38* 1.27*  CALCIUM 9.6 9.4  MG 2.2  --    Liver Function Tests: Recent Labs    2020/09/20 1115  AST 37  ALT 18  ALKPHOS 81  BILITOT 1.3*  PROT 6.3*  ALBUMIN 2.6*   No results for input(s): LIPASE, AMYLASE in the last 72 hours. CBC: Recent Labs    09-20-20 1115 09-20-20 1115 08/24/20 0537 08/24/20 0537 08/24/20 2224 08/25/20 0659  WBC 36.3*  36.1*   < > 31.9*  --   --  19.6*  NEUTROABS 33.0*  --   --   --   --   --   HGB 13.8  14.0   < > 12.5*   < > 12.8* 11.9*  HCT 41.7  41.6   < > 37.3*   < > 38.8* 36.3*  MCV 92.9  92.2   < > 91.6  --   --  94.3  PLT 372  377   < > 317  --   --  266   < > = values in this interval not displayed.   Cardiac Enzymes: No results for input(s): CKTOTAL, CKMB, CKMBINDEX, TROPONINI in the last 72 hours. BNP: Invalid input(s): POCBNP D-Dimer: No results for input(s): DDIMER in the last 72 hours. Hemoglobin A1C: No results for input(s): HGBA1C in the last 72 hours. Fasting Lipid Panel: No results for input(s): CHOL, HDL, LDLCALC, TRIG, CHOLHDL, LDLDIRECT in the last 72 hours. Thyroid Function Tests: No results for input(s): TSH, T4TOTAL, T3FREE, THYROIDAB in the last 72 hours.  Invalid input(s): FREET3 Anemia Panel: Recent Labs     09/20/20 1947  VITAMINB12 136*  FOLATE 8.4  FERRITIN 386*  TIBC 146*  IRON 24*  RETICCTPCT 2.2     PHYSICAL EXAM General: drowsy HEENT:  Normocephalic and atramatic Neck:  No JVD.  Lungs: Clear bilaterally to auscultation and percussion. Heart: ST . Normal S1 and S2. Systolic murmur III/VI Abdomen: Bowel sounds are positive, abdomen soft and non-tender  Msk:  Back normal, normal gait. Normal strength and tone for age. Extremities: No clubbing, cyanosis or edema.   Neuro: Alert and oriented X 3. Psych:  Good affect, responds appropriately  TELEMETRY: ST with frequent PACs. 104/bpm  ASSESSMENT AND PLAN: Patient presenting to the emergency department with progressive generalized weakness found to have pneumonia sepsis.  Patient continues to require norepinephrine. Patient is now more tachycardic with increased ectopy. If possible, would recommend transitioning the patient from norepinephrine to phenylephrine. Echo revealed worsening EF, now 25-30%, with global hypokinesis, grade III diastolic dysfunction, mod  to severe MR/TR, and mild to mod AS.  Patient with improving AKI.  With continued vasopressor requirements and worsening echocardiogram patient with poor prognosis. Advise continued goals of care conversations.  We will continue to follow.  Principal Problem:   Severe sepsis with septic shock (HCC) Active Problems:   Chronic kidney disease, stage 3a (HCC)   Rectal bleeding   Chronic systolic CHF (congestive heart failure) (HCC)   Respiratory failure with hypoxia (HCC)   Goals of care, counseling/discussion   Palliative care by specialist   Advanced care planning/counseling discussion   Failure to thrive in adult   Protein-calorie malnutrition, severe    Maryelizabeth Kaufmann, NP-C 08/25/2020 12:15 PM

## 2020-08-25 NOTE — Progress Notes (Signed)
Pt Alert. Oriented X3. Denies pain. Poor appetite. Symmetrical. Per MD MAP goal >55. Levophed off, tolerating well. Lasix gtt started. Will continue to monitor.

## 2020-08-25 NOTE — Progress Notes (Signed)
PT Cancellation Note  Patient Details Name: Jeffery Smith MRN: [id number] DOB: 06-09-1925   Cancelled Treatment:    Reason Eval/Treat Not Completed: Medical issues which prohibited therapy (Most recent BP 70s/60s mmHg, downtrending from earlier in day. Will reattempt at later date/time as medically appropriate.)   Kynzleigh Bandel C 08/25/2020, 12:33 PM

## 2020-08-25 NOTE — Progress Notes (Addendum)
PROGRESS NOTE    Jeffery Smith  WUJ:[id number] DOB: 03-May-1925 DOA: 08/24/2020 PCP: System, Provider Not In   Chief complaint.  Shortness of breath. Brief Narrative:  Patient is a 86 year old male with past medical history of chronic systolic congestive heart failure, moderate to severe aortic stenosis, who presents to the emergency room with bloody diarrhea.  He was hypotensive, he was giving IV fluids.  Checks x-ray showed diffuse  airspace opacities consistent with multifocal pneumonia. Patient developed hypotension, acute hypoxemic respiratory failure a few hours after arriving the hospital.  He is transferred to ICU for pressors.  He is also on Unasyn for possible aspiration pneumonia.   Assessment & Plan:   Principal Problem:   Severe sepsis with septic shock (HCC) Active Problems:   Chronic kidney disease, stage 3a (HCC)   Rectal bleeding   Chronic systolic CHF (congestive heart failure) (HCC)   Respiratory failure with hypoxia (HCC)   Goals of care, counseling/discussion   Palliative care by specialist   Advanced care planning/counseling discussion   Failure to thrive in adult   Protein-calorie malnutrition, severe  #1.  Septic shock, aspiration pneumonia. Patient has a recent bloody diarrhea with nausea vomiting.  Probably aspirated emesis.  Patient has mild elevation of procalcitonin level.  Continue Unasyn. Patient is more hemodynamically stable now.  2.  Acute hypoxemic respiratory failure secondary to combination of aspiration pneumonia and acute on chronic systolic congestive heart failure. Continue oxygen treatment.  3.  Acute on chronic combined diastolic and systolic congestive heart failure. Moderate to severe aortic stenosis. Moderate to severe mitral regurgitation. Moderate to severe tricuspid regurgitation. Reviewed echocardiogram performed yesterday, ejection fraction 25 to 30%, which reflected worsening LV systolic function.  Aortic stenosis  reported as mild to moderate, comparing to previous moderately severe.  This is under estimate due to worsening ejection fraction. Patient has evidence of volume overload.  I personally reviewed the patient chest x-rays performed over the last few days, diffuse bilateral infiltrates, could be due to congestive heart failure.  BNP significant elevated. I will give her 40 mg IV Lasix, followed by Lasix drip.  4.  Chronic kidney disease stage IIIa. Renal function still stable.  5.  Bloody diarrhea. Condition has resolved.  6.  Hypotension. Patient currently receiving hydrocortisone and midodrine.  Since we are giving patient Lasix, he will be also receiving 25 g of albumin.  7.  Vitamin B12 deficient anemia. Give B12 injection.  Followed by oral supplement.   DVT prophylaxis: Lovenox Code Status: Full Family Communication: Wife updated Disposition Plan:  .   Status is: Inpatient  Remains inpatient appropriate because:Inpatient level of care appropriate due to severity of illness   Dispo: The patient is from: Home              Anticipated d/c is to: ?              Anticipated d/c date is: 3 days              Patient currently is not medically stable to d/c.        I/O last 3 completed shifts: In: 1071.5 [I.V.:470.2; IV Piggyback:601.2] Out: 350 [Urine:350] Total I/O In: 45 [I.V.:45] Out: 200 [Urine:200]     Consultants:   Cardiology.  Procedures: None  Antimicrobials: Unasyn.  Subjective: Patient feels very weak, still on 10 L oxygen with short of breath with minimal exertion.  Cough, nonproductive. Denies any abdominal pain nausea vomiting. No fever or chills. No dysuria  hematuria. No chest pain or palpitation.  Objective: Vitals:   08/25/20 1000 08/25/20 1015 08/25/20 1030 08/25/20 1045  BP: (!) 79/67 (!) 83/56 (!) 88/73 (!) 77/62  Pulse: (!) 55 98 (!) 104 96  Resp: (!) 22 (!) 21 19 15   Temp:      TempSrc:      SpO2: 90% 93% 95% 99%  Weight:        Height:        Intake/Output Summary (Last 24 hours) at 08/25/2020 1324 Last data filed at 08/25/2020 0800 Gross per 24 hour  Intake 632.68 ml  Output 300 ml  Net 332.68 ml   Filed Weights   08/26/2020 1050 08/24/20 0500 08/25/20 0500  Weight: 72.1 kg 69.4 kg 70.1 kg    Examination:  General exam: Appears calm and comfortable  Respiratory system: Diffuse crackles in the base bilaterally. Respiratory effort normal. Cardiovascular system: S1 & S2 heard, RRR. No JVD, murmurs, rubs, gallops or clicks. No pedal edema. Gastrointestinal system: Abdomen is nondistended, soft and nontender. No organomegaly or masses felt. Normal bowel sounds heard. Central nervous system: Alert and oriented x2. No focal neurological deficits. Extremities: Symmetric  Skin: No rashes, lesions or ulcers Psychiatry:  Mood & affect appropriate.     Data Reviewed: I have personally reviewed following labs and imaging studies  CBC: Recent Labs  Lab 08/21/20 1706 08/09/2020 1115 08/24/20 0537 08/24/20 2224 08/25/20 0659  WBC 12.4* 36.3*  36.1* 31.9*  --  19.6*  NEUTROABS 10.7* 33.0*  --   --   --   HGB 13.5 13.8  14.0 12.5* 12.8* 11.9*  HCT 41.0 41.7  41.6 37.3* 38.8* 36.3*  MCV 95.1 92.9  92.2 91.6  --  94.3  PLT 315 372  377 317  --  266   Basic Metabolic Panel: Recent Labs  Lab 08/21/20 1706 08/28/2020 1115 08/24/20 0537 08/25/20 0659  NA 133* 133* 136 136  K 4.3 5.1 4.3 4.0  CL 100 97* 102 102  CO2 23 22 23 23   GLUCOSE 118* 114* 123* 142*  BUN 24* 39* 32* 26*  CREATININE 1.34* 1.50* 1.38* 1.27*  CALCIUM 9.8 10.0 9.6 9.4  MG  --   --  2.2  --    GFR: Estimated Creatinine Clearance: 36 mL/min (A) (by C-G formula based on SCr of 1.27 mg/dL (H)). Liver Function Tests: Recent Labs  Lab 08/22/2020 1115  AST 37  ALT 18  ALKPHOS 81  BILITOT 1.3*  PROT 6.3*  ALBUMIN 2.6*   No results for input(s): LIPASE, AMYLASE in the last 168 hours. No results for input(s): AMMONIA in the last  168 hours. Coagulation Profile: Recent Labs  Lab 08/27/2020 1115  INR 1.1   Cardiac Enzymes: No results for input(s): CKTOTAL, CKMB, CKMBINDEX, TROPONINI in the last 168 hours. BNP (last 3 results) No results for input(s): PROBNP in the last 8760 hours. HbA1C: No results for input(s): HGBA1C in the last 72 hours. CBG: Recent Labs  Lab 09/03/2020 2121  GLUCAP 109*   Lipid Profile: No results for input(s): CHOL, HDL, LDLCALC, TRIG, CHOLHDL, LDLDIRECT in the last 72 hours. Thyroid Function Tests: Recent Labs    08/09/2020 1947  FREET4 1.31*   Anemia Panel: Recent Labs    08/14/2020 1947  VITAMINB12 136*  FOLATE 8.4  FERRITIN 386*  TIBC 146*  IRON 24*  RETICCTPCT 2.2   Sepsis Labs: Recent Labs  Lab 08/28/2020 1121 08/13/2020 1200 08/24/20 0537 08/25/20 0659  PROCALCITON 0.53  --  0.29 0.26  LATICACIDVEN  --  1.6  --   --     Recent Results (from the past 240 hour(s))  Respiratory Panel by RT PCR (Flu A&B, Covid) - Nasopharyngeal Swab     Status: None   Collection Time: 08/18/2020 11:02 AM   Specimen: Nasopharyngeal Swab  Result Value Ref Range Status   SARS Coronavirus 2 by RT PCR NEGATIVE NEGATIVE Final    Comment: (NOTE) SARS-CoV-2 target nucleic acids are NOT DETECTED.  The SARS-CoV-2 RNA is generally detectable in upper respiratoy specimens during the acute phase of infection. The lowest concentration of SARS-CoV-2 viral copies this assay can detect is 131 copies/mL. A negative result does not preclude SARS-Cov-2 infection and should not be used as the sole basis for treatment or other patient management decisions. A negative result may occur with  improper specimen collection/handling, submission of specimen other than nasopharyngeal swab, presence of viral mutation(s) within the areas targeted by this assay, and inadequate number of viral copies (<131 copies/mL). A negative result must be combined with clinical observations, patient history, and epidemiological  information. The expected result is Negative.  Fact Sheet for Patients:  https://www.moore.com/  Fact Sheet for Healthcare Providers:  https://www.young.biz/  This test is no t yet approved or cleared by the [place] FDA and  has been authorized for detection and/or diagnosis of SARS-CoV-2 by FDA under an Emergency Use Authorization (EUA). This EUA will remain  in effect (meaning this test can be used) for the duration of the COVID-19 declaration under Section 564(b)(1) of the Act, 21 U.S.C. section 360bbb-3(b)(1), unless the authorization is terminated or revoked sooner.     Influenza A by PCR NEGATIVE NEGATIVE Final   Influenza B by PCR NEGATIVE NEGATIVE Final    Comment: (NOTE) The Xpert Xpress SARS-CoV-2/FLU/RSV assay is intended as an aid in  the diagnosis of influenza from Nasopharyngeal swab specimens and  should not be used as a sole basis for treatment. Nasal washings and  aspirates are unacceptable for Xpert Xpress SARS-CoV-2/FLU/RSV  testing.  Fact Sheet for Patients: https://www.moore.com/  Fact Sheet for Healthcare Providers: https://www.young.biz/  This test is not yet approved or cleared by the [place] FDA and  has been authorized for detection and/or diagnosis of SARS-CoV-2 by  FDA under an Emergency Use Authorization (EUA). This EUA will remain  in effect (meaning this test can be used) for the duration of the  Covid-19 declaration under Section 564(b)(1) of the Act, 21  U.S.C. section 360bbb-3(b)(1), unless the authorization is  terminated or revoked. Performed at [facility], [address] [place], [place] [address]   Blood culture (routine x 2)     Status: None (Preliminary result)   Collection Time: 08/11/2020 12:06 PM   Specimen: BLOOD  Result Value Ref Range Status   Specimen Description BLOOD RIGHT FA  Final   Special Requests   Final    BOTTLES  DRAWN AEROBIC AND ANAEROBIC Blood Culture adequate volume   Culture   Final    NO GROWTH 2 DAYS Performed at [facility], [address], [place], [place] [address]    Report Status PENDING  Incomplete  Blood culture (routine x 2)     Status: None (Preliminary result)   Collection Time: 09/03/2020 12:06 PM   Specimen: BLOOD  Result Value Ref Range Status   Specimen Description BLOOD LEFT [facility]  Final   Special Requests   Final    BOTTLES DRAWN AEROBIC AND ANAEROBIC Blood  Culture adequate volume   Culture   Final    NO GROWTH 2 DAYS Performed at [facility], [address] [place], [place] [address]    Report Status PENDING  Incomplete  MRSA PCR Screening     Status: None   Collection Time: September 03, 2020  9:50 PM   Specimen: Nasal Mucosa; Nasopharyngeal  Result Value Ref Range Status   MRSA by PCR NEGATIVE NEGATIVE Final    Comment:        The GeneXpert MRSA Assay (FDA approved for NASAL specimens only), is one component of a comprehensive MRSA colonization surveillance program. It is not intended to diagnose MRSA infection nor to guide or monitor treatment for MRSA infections. Performed at [facility], [address], [place], [place] [address]          Radiology Studies: [facility] Chest [place] 1 View  Result Date: 08/24/2020 CLINICAL DATA:  Respiratory failure.  Hypoxia. EXAM: PORTABLE CHEST 1 VIEW COMPARISON:  09-03-2020.  07/12/2020. FINDINGS: Stable cardiomegaly. Diffuse severe bilateral pulmonary infiltrates again noted without interim change. Small wedge-shaped densities noted over the right upper and left lower lungs. Although these changes may be secondary to atelectasis, pulmonary infarcts cannot be excluded. No prominent pleural effusion. No pneumothorax. Degenerative change thoracic spine. IMPRESSION: 1. Diffuse severe bilateral pulmonary infiltrates again noted without interim change. 2. Small wedge-shaped densities noted over the right upper  and left lower lungs. Although these changes may be secondary to atelectasis, pulmonary infarcts cannot be excluded. 3.  Stable cardiomegaly. Electronically Signed   By: Maisie Fus  Register   On: 08/24/2020 07:09   DG Chest Portable 1 View  Result Date: September 03, 2020 CLINICAL DATA:  Shortness of breath.  Patient with life vest. EXAM: PORTABLE CHEST 1 VIEW COMPARISON:  Earlier today FINDINGS: Hardware associated with patient's life vest is again noted. Stable cardiomediastinal silhouette. Diffuse bilateral interstitial and airspace opacities are again noted throughout both lungs. When compared with the previous exam there is been worsening opacification of the right mid lung. IMPRESSION: 1. Diffuse bilateral interstitial and airspace opacities are again noted with worsening aeration to the right midlung. Electronically Signed   By: Signa Kell M.D.   On: 2020/09/03 18:07   DG Chest Port 1 View  Result Date: Sep 03, 2020 CLINICAL DATA:  Rectal bleeding and diarrhea. Concern for potential sepsis EXAM: PORTABLE CHEST 1 VIEW COMPARISON:  July 12, 2020 FINDINGS: There is airspace opacity throughout multiple sites in the right lung. There is underlying hyperexpansion, stable. There is diffuse interstitial thickening throughout the lungs, stable. Heart size and pulmonary vascular normal. No adenopathy. There is aortic atherosclerosis. No bone lesions. IMPRESSION: Multifocal areas of airspace opacity consistent with multifocal pneumonia throughout right lung. Underlying interstitial fibrosis and mild hyperexpansion. Heart size normal. No adenopathy evident. Aortic Atherosclerosis (ICD10-I70.0). Electronically Signed   By: Bretta Bang III M.D.   On: September 03, 2020 13:49   ECHOCARDIOGRAM COMPLETE  Result Date: 08/24/2020    ECHOCARDIOGRAM REPORT   Patient Name:   Jeffery Smith Date of Exam: 08/24/2020 Medical Rec #:  [id number]            Height:       73.0 in Accession #:    [id number]           Weight:        153.0 lb Date of Birth:  Jul 10, 1925            BSA:          1.920 m  Patient Age:    89 years             BP:           84/61 mmHg Patient Gender: M                    HR:           103 bpm. Exam Location:  [facility] Procedure: 2D Echo, Color Doppler and Cardiac Doppler Indications:     Septic shock  History:         Patient has prior history of Echocardiogram examinations, most                  recent 06/28/2020. Risk Factors:Dyslipidemia.  Sonographer:     Humphrey Rolls RDCS (AE) Referring Phys:  [id number] Caryn Bee PACKER Diagnosing Phys: Adrian Blackwater MD  Sonographer Comments: Suboptimal parasternal window. IMPRESSIONS  1. Left ventricular ejection fraction, by estimation, is 25 to 30%. The left ventricle has severely decreased function. The left ventricle demonstrates global hypokinesis. The left ventricular internal cavity size was severely dilated. There is mild concentric left ventricular hypertrophy. Left ventricular diastolic parameters are consistent with Grade III diastolic dysfunction (restrictive).  2. Right ventricular systolic function is mildly reduced. The right ventricular size is moderately enlarged. Mildly increased right ventricular wall thickness.  3. Left atrial size was moderately dilated.  4. Right atrial size was moderately dilated.  5. The mitral valve is grossly normal. Moderate to severe mitral valve regurgitation.  6. The tricuspid valve is abnormal. Tricuspid valve regurgitation is moderate to severe.  7. The aortic valve is abnormal. Aortic valve regurgitation is trivial. Mild to moderate aortic valve stenosis. Conclusion(s)/Recommendation(s): Findings consistent with dilated cardiomyopathy. FINDINGS  Left Ventricle: Left ventricular ejection fraction, by estimation, is 25 to 30%. The left ventricle has severely decreased function. The left ventricle demonstrates global hypokinesis. The left ventricular internal cavity size was severely dilated. There is mild concentric left ventricular  hypertrophy. Left ventricular diastolic parameters are consistent with Grade III diastolic dysfunction (restrictive). Right Ventricle: The right ventricular size is moderately enlarged. Mildly increased right ventricular wall thickness. Right ventricular systolic function is mildly reduced. Left Atrium: Left atrial size was moderately dilated. Right Atrium: Right atrial size was moderately dilated. Pericardium: There is no evidence of pericardial effusion. Mitral Valve: The mitral valve is grossly normal. Moderate to severe mitral valve regurgitation. MV peak gradient, 6.5 mmHg. The mean mitral valve gradient is 3.0 mmHg. Tricuspid Valve: The tricuspid valve is abnormal. Tricuspid valve regurgitation is moderate to severe. Aortic Valve: The aortic valve is abnormal. Aortic valve regurgitation is trivial. Mild to moderate aortic stenosis is present. Aortic valve mean gradient measures 18.0 mmHg. Aortic valve peak gradient measures 36.0 mmHg. Aortic valve area, by VTI measures 1.20 cm. Pulmonic Valve: The pulmonic valve was grossly normal. Pulmonic valve regurgitation is mild. Aorta: The aortic root was not well visualized. IAS/Shunts: No atrial level shunt detected by color flow Doppler.  LEFT VENTRICLE PLAX 2D LVIDd:         4.88 cm  Diastology LVIDs:         4.25 cm  LV e' medial:    5.22 cm/s LV PW:         1.21 cm  LV E/e' medial:  23.1 LV IVS:        0.95 cm  LV e' lateral:   9.46 cm/s LVOT diam:     2.50 cm  LV E/e' lateral: 12.7  LV SV:         62 LV SV Index:   32 LVOT Area:     4.91 cm  RIGHT VENTRICLE RV Basal diam:  3.57 cm LEFT ATRIUM             Index       RIGHT ATRIUM           Index LA diam:        3.70 cm 1.93 cm/m  RA Area:     15.80 cm LA Vol (A2C):   54.2 ml 28.23 ml/m RA Volume:   43.30 ml  22.55 ml/m LA Vol (A4C):   37.6 ml 19.58 ml/m LA Biplane Vol: 46.3 ml 24.11 ml/m  AORTIC VALVE                    PULMONIC VALVE AV Area (Vmax):    1.10 cm     PV Vmax:       0.63 m/s AV Area (Vmean):    1.16 cm     PV Vmean:      40.800 cm/s AV Area (VTI):     1.20 cm     PV VTI:        0.092 m AV Vmax:           300.00 cm/s  PV Peak grad:  1.6 mmHg AV Vmean:          194.000 cm/s PV Mean grad:  1.0 mmHg AV VTI:            0.517 m AV Peak Grad:      36.0 mmHg AV Mean Grad:      18.0 mmHg LVOT Vmax:         67.20 cm/s LVOT Vmean:        45.900 cm/s LVOT VTI:          0.126 m LVOT/AV VTI ratio: 0.24  AORTA Ao Root diam: 3.30 cm MITRAL VALVE                TRICUSPID VALVE MV Area (PHT): 7.25 cm     TR Peak grad:   49.0 mmHg MV Peak grad:  6.5 mmHg     TR Vmax:        350.00 cm/s MV Mean grad:  3.0 mmHg MV Vmax:       1.27 m/s     SHUNTS MV Vmean:      86.2 cm/s    Systemic VTI:  0.13 m MV Decel Time: 105 msec     Systemic Diam: 2.50 cm MV E velocity: 120.33 cm/s MV A velocity: 95.30 cm/s MV E/A ratio:  1.26 Adrian BlackwaterShaukat Khan MD Electronically signed by Adrian BlackwaterShaukat Khan MD Signature Date/Time: 08/24/2020/6:57:23 PM    Final         Scheduled Meds: . Chlorhexidine Gluconate Cloth  6 each Topical Daily  . enoxaparin (LOVENOX) injection  40 mg Subcutaneous Q24H  . feeding supplement  237 mL Oral TID BM  . fluticasone  2 spray Each Nare Daily  . furosemide  40 mg Intravenous Once  . hydrocortisone sod succinate (SOLU-CORTEF) inj  50 mg Intravenous Q6H  . ipratropium  2 spray Each Nare QHS  . midodrine  10 mg Oral TID WC  . multivitamin with minerals  1 tablet Oral Daily  . pantoprazole (PROTONIX) IV  40 mg Intravenous Q12H  . polyethylene glycol  17 g Oral Daily  . sodium chloride flush  3 mL Intravenous  Q12H   Continuous Infusions: . sodium chloride    . sodium chloride    . ampicillin-sulbactam (UNASYN) IV Stopped (08/25/20 0544)  . furosemide (LASIX) 200 mg in dextrose 5% 100 mL ( /mL) infusion    . norepinephrine (LEVOPHED) Adult infusion 6 mcg/min (08/25/20 0800)     LOS: 2 days    Time spent: 36 minutes    Marrion Coy, MD [facility]   To contact the attending provider  between 7A-7P or the covering provider during after hours 7P-7A, please log into the web site [email or web address] and access using universal [place] password for that web site. If you do not have the password, please call the hospital operator.  08/25/2020, 1:24 PM

## 2020-08-25 NOTE — Progress Notes (Signed)
Arlyss Repress, MD [address]  [address]  [place], [place] [address]  Main: [phone]  Fax: [phone] Pager: [phone]   Subjective: Patient is currently off pressors, no acute events overnight. Patient's wife is bedside.  He was able to eat his breakfast today.  Per patient's nurse, he had 1 bowel movement which was nonbloody.  He had cough with expectoration mixed with blood.   Objective: Vital signs in last 24 hours: Vitals:   08/25/20 1000 08/25/20 1015 08/25/20 1030 08/25/20 1045  BP: (!) 79/67 (!) 83/56 (!) 88/73 (!) 77/62  Pulse: (!) 55 98 (!) 104 96  Resp: (!) 22 (!) 21 19 15   Temp:      TempSrc:      SpO2: 90% 93% 95% 99%  Weight:      Height:       Weight change: -2.022 kg  Intake/Output Summary (Last 24 hours) at 08/25/2020 1153 Last data filed at 08/25/2020 0800 Gross per 24 hour  Intake 736.78 ml  Output 300 ml  Net 436.78 ml     Exam: Heart:: Regular rate and rhythm Lungs: clear to auscultation and bibasilar rales Abdomen: soft, nontender, normal bowel sounds   Lab Results: CBC Latest Ref Rng & Units 08/25/2020 08/24/2020 08/24/2020  WBC 4.0 - 10.5 K/uL 19.6(H) - 31.9(H)  Hemoglobin 13.0 - 17.0 g/dL 11.9(L) 12.8(L) 12.5(L)  Hematocrit 39 - 52 % 36.3(L) 38.8(L) 37.3(L)  Platelets 150 - 400 K/uL 266 - 317   CMP Latest Ref Rng & Units 08/25/2020 08/24/2020 09/07/2020  Glucose 70 - 99 mg/dL 324(M) 010(U) 725(D)  BUN 8 - 23 mg/dL 66(Y) 40(H) 47(Q)  Creatinine 0.61 - 1.24 mg/dL 2.59(D) 6.38(V) 5.64(P)  Sodium 135 - 145 mmol/L 136 136 133(L)  Potassium 3.5 - 5.1 mmol/L 4.0 4.3 5.1  Chloride 98 - 111 mmol/L 102 102 97(L)  CO2 22 - 32 mmol/L 23 23 22   Calcium 8.9 - 10.3 mg/dL 9.4 9.6 32.9  Total Protein 6.5 - 8.1 g/dL - - 6.3(L)  Total Bilirubin 0.3 - 1.2 mg/dL - - 1.3(H)  Alkaline Phos 38 - 126 U/L - - 81  AST 15 - 41 U/L - - 37  ALT 0 - 44 U/L - - 18    Micro Results: Recent Results (from the past 240 hour(s))    Respiratory Panel by RT PCR (Flu A&B, Covid) - Nasopharyngeal Swab     Status: None   Collection Time: 09/04/2020 11:02 AM   Specimen: Nasopharyngeal Swab  Result Value Ref Range Status   SARS Coronavirus 2 by RT PCR NEGATIVE NEGATIVE Final    Comment: (NOTE) SARS-CoV-2 target nucleic acids are NOT DETECTED.  The SARS-CoV-2 RNA is generally detectable in upper respiratoy specimens during the acute phase of infection. The lowest concentration of SARS-CoV-2 viral copies this assay can detect is 131 copies/mL. A negative result does not preclude SARS-Cov-2 infection and should not be used as the sole basis for treatment or other patient management decisions. A negative result may occur with  improper specimen collection/handling, submission of specimen other than nasopharyngeal swab, presence of viral mutation(s) within the areas targeted by this assay, and inadequate number of viral copies (<131 copies/mL). A negative result must be combined with clinical observations, patient history, and epidemiological information. The expected result is Negative.  Fact Sheet for Patients:  https://www.moore.com/  Fact Sheet for Healthcare Providers:  https://www.young.biz/  This test is no t yet approved or cleared by the [place] FDA  and  has been authorized for detection and/or diagnosis of SARS-CoV-2 by FDA under an Emergency Use Authorization (EUA). This EUA will remain  in effect (meaning this test can be used) for the duration of the COVID-19 declaration under Section 564(b)(1) of the Act, 21 U.S.C. section 360bbb-3(b)(1), unless the authorization is terminated or revoked sooner.     Influenza A by PCR NEGATIVE NEGATIVE Final   Influenza B by PCR NEGATIVE NEGATIVE Final    Comment: (NOTE) The Xpert Xpress SARS-CoV-2/FLU/RSV assay is intended as an aid in  the diagnosis of influenza from Nasopharyngeal swab specimens and  should not be used as  a sole basis for treatment. Nasal washings and  aspirates are unacceptable for Xpert Xpress SARS-CoV-2/FLU/RSV  testing.  Fact Sheet for Patients: https://www.moore.com/  Fact Sheet for Healthcare Providers: https://www.young.biz/  This test is not yet approved or cleared by the [place] FDA and  has been authorized for detection and/or diagnosis of SARS-CoV-2 by  FDA under an Emergency Use Authorization (EUA). This EUA will remain  in effect (meaning this test can be used) for the duration of the  Covid-19 declaration under Section 564(b)(1) of the Act, 21  U.S.C. section 360bbb-3(b)(1), unless the authorization is  terminated or revoked. Performed at [facility], [address] [place], [place] [address]   Blood culture (routine x 2)     Status: None (Preliminary result)   Collection Time: 09/04/2020 12:06 PM   Specimen: BLOOD  Result Value Ref Range Status   Specimen Description BLOOD RIGHT FA  Final   Special Requests   Final    BOTTLES DRAWN AEROBIC AND ANAEROBIC Blood Culture adequate volume   Culture   Final    NO GROWTH 2 DAYS Performed at [facility], [address], [place], [place] [address]    Report Status PENDING  Incomplete  Blood culture (routine x 2)     Status: None (Preliminary result)   Collection Time: 08/28/2020 12:06 PM   Specimen: BLOOD  Result Value Ref Range Status   Specimen Description BLOOD LEFT [facility]  Final   Special Requests   Final    BOTTLES DRAWN AEROBIC AND ANAEROBIC Blood Culture adequate volume   Culture   Final    NO GROWTH 2 DAYS Performed at [facility], [address] [place], [place] [address]    Report Status PENDING  Incomplete  MRSA PCR Screening     Status: None   Collection Time: 09/07/2020  9:50 PM   Specimen: Nasal Mucosa; Nasopharyngeal  Result Value Ref Range Status   MRSA by PCR NEGATIVE NEGATIVE Final    Comment:        The GeneXpert MRSA Assay  (FDA approved for NASAL specimens only), is one component of a comprehensive MRSA colonization surveillance program. It is not intended to diagnose MRSA infection nor to guide or monitor treatment for MRSA infections. Performed at [facility], [address] [place], [place] [address]    Studies/Results: CT ABDOMEN PELVIS W CONTRAST  Result Date: 08/22/2020 CLINICAL DATA:  86 year old male with history of acute onset of nonlocalized abdominal pain. Blood from rectum. EXAM: CT ABDOMEN AND PELVIS WITH CONTRAST TECHNIQUE: Multidetector CT imaging of the abdomen and pelvis was performed using the standard protocol following bolus administration of intravenous contrast. CONTRAST:  36mL OMNIPAQUE IOHEXOL 300 MG/ML  SOLN COMPARISON:  No priors. FINDINGS: Lower chest: Electronic devices in the subcutaneous fat of the lower back causing extensive beam hardening artifact.  Small right pleural effusion lying dependently. Patchy airspace consolidation in the right lower lobe. Patchy peribronchovascular ground-glass attenuation and septal thickening also noted in the lower lobes of the lungs bilaterally. Aortic atherosclerosis. Small hiatal hernia. Hepatobiliary: 1.5 cm low-attenuation lesion in the central aspect of the right lobe of the liver (axial image 18 of series 2), compatible with a simple cyst. Several other tiny 2-3 mm low-attenuation lesions noted in the liver, too small to characterize, but statistically likely to represent tiny cysts or biliary hamartomas. No definite suspicious hepatic lesions. No intra or extrahepatic biliary ductal dilatation. Trace amount of high attenuation material lying dependently in the gallbladder, likely to reflect biliary sludge or tiny noncalcified gallstones. No findings to suggest an acute cholecystitis at this time. Pancreas: No pancreatic mass. No pancreatic ductal dilatation. No pancreatic or peripancreatic fluid collections or inflammatory changes.  Spleen: Unremarkable. Adrenals/Urinary Tract: 2 mm nonobstructive calculus in the lower pole collecting system of the left kidney. Bilateral kidneys and adrenal glands are otherwise normal in appearance. No hydroureteronephrosis. Urinary bladder is normal in appearance. Stomach/Bowel: Normal appearance of the stomach. No pathologic dilatation of small bowel or colon. Masslike mural thickening in the distal rectum where there is also some mucosal hyperenhancement, best appreciated on axial images 83-86 of series 2. Numerous colonic diverticulae are noted, without surrounding inflammatory changes to suggest an acute diverticulitis at this time. Vascular/Lymphatic: Aortic atherosclerosis, without evidence of aneurysm or dissection in the abdominal or pelvic vasculature. No lymphadenopathy noted in the abdomen or pelvis. Reproductive: Prostate gland and seminal vesicles are unremarkable in appearance. Other: No significant volume of ascites.  No pneumoperitoneum. Musculoskeletal: Chronic appearing T12 compression fracture with 30% loss of anterior vertebral body height. There are no aggressive appearing lytic or blastic lesions noted in the visualized portions of the skeleton. IMPRESSION: 1. Mass-like mural thickening of the distal rectum with some associated mucosal hyperenhancement. Correlation with sigmoidoscopy is suggested to exclude the possibility of infiltrative neoplasm. 2. Areas of airspace consolidation in the lower lobes of the lungs bilaterally (right greater than left), concerning for multilobar pneumonia. 3. Small right parapneumonic pleural effusion. 4. Cholelithiasis or biliary sludge lying dependently in the gallbladder, without evidence of acute cholecystitis at this time. 5. Colonic diverticulosis. 6. 2 mm nonobstructive calculus in the lower pole collecting system of the left kidney. 7. Aortic atherosclerosis. 8. Additional incidental findings, as above. Electronically Signed   By: Trudie Reed  M.D.   On: 09/06/2020 13:08   DG Chest Port 1 View  Result Date: 08/24/2020 CLINICAL DATA:  Respiratory failure.  Hypoxia. EXAM: PORTABLE CHEST 1 VIEW COMPARISON:  08/21/2020.  07/12/2020. FINDINGS: Stable cardiomegaly. Diffuse severe bilateral pulmonary infiltrates again noted without interim change. Small wedge-shaped densities noted over the right upper and left lower lungs. Although these changes may be secondary to atelectasis, pulmonary infarcts cannot be excluded. No prominent pleural effusion. No pneumothorax. Degenerative change thoracic spine. IMPRESSION: 1. Diffuse severe bilateral pulmonary infiltrates again noted without interim change. 2. Small wedge-shaped densities noted over the right upper and left lower lungs. Although these changes may be secondary to atelectasis, pulmonary infarcts cannot be excluded. 3.  Stable cardiomegaly. Electronically Signed   By: Maisie Fus  Register   On: 08/24/2020 07:09   DG Chest Portable 1 View  Result Date: 08/21/2020 CLINICAL DATA:  Shortness of breath.  Patient with life vest. EXAM: PORTABLE CHEST 1 VIEW COMPARISON:  Earlier today FINDINGS: Hardware associated with patient's life vest is again noted. Stable cardiomediastinal silhouette. Diffuse  bilateral interstitial and airspace opacities are again noted throughout both lungs. When compared with the previous exam there is been worsening opacification of the right mid lung. IMPRESSION: 1. Diffuse bilateral interstitial and airspace opacities are again noted with worsening aeration to the right midlung. Electronically Signed   By: Signa Kell M.D.   On: 08/22/2020 18:07   DG Chest Port 1 View  Result Date: 08/10/2020 CLINICAL DATA:  Rectal bleeding and diarrhea. Concern for potential sepsis EXAM: PORTABLE CHEST 1 VIEW COMPARISON:  July 12, 2020 FINDINGS: There is airspace opacity throughout multiple sites in the right lung. There is underlying hyperexpansion, stable. There is diffuse interstitial  thickening throughout the lungs, stable. Heart size and pulmonary vascular normal. No adenopathy. There is aortic atherosclerosis. No bone lesions. IMPRESSION: Multifocal areas of airspace opacity consistent with multifocal pneumonia throughout right lung. Underlying interstitial fibrosis and mild hyperexpansion. Heart size normal. No adenopathy evident. Aortic Atherosclerosis (ICD10-I70.0). Electronically Signed   By: Bretta Bang III M.D.   On: 08/24/2020 13:49   ECHOCARDIOGRAM COMPLETE  Result Date: 08/24/2020    ECHOCARDIOGRAM REPORT   Patient Name:   MASAKI ROTHBAUER Date of Exam: 08/24/2020 Medical Rec #:  [id number]            Height:       73.0 in Accession #:    [id number]           Weight:       153.0 lb Date of Birth:  December 11, 1924            BSA:          1.920 m Patient Age:    86 years             BP:           84/61 mmHg Patient Gender: M                    HR:           103 bpm. Exam Location:  [facility] Procedure: 2D Echo, Color Doppler and Cardiac Doppler Indications:     Septic shock  History:         Patient has prior history of Echocardiogram examinations, most                  recent 06/28/2020. Risk Factors:Dyslipidemia.  Sonographer:     Humphrey Rolls RDCS (AE) Referring Phys:  [id number] Caryn Bee PACKER Diagnosing Phys: Adrian Blackwater MD  Sonographer Comments: Suboptimal parasternal window. IMPRESSIONS  1. Left ventricular ejection fraction, by estimation, is 25 to 30%. The left ventricle has severely decreased function. The left ventricle demonstrates global hypokinesis. The left ventricular internal cavity size was severely dilated. There is mild concentric left ventricular hypertrophy. Left ventricular diastolic parameters are consistent with Grade III diastolic dysfunction (restrictive).  2. Right ventricular systolic function is mildly reduced. The right ventricular size is moderately enlarged. Mildly increased right ventricular wall thickness.  3. Left atrial size was moderately dilated.   4. Right atrial size was moderately dilated.  5. The mitral valve is grossly normal. Moderate to severe mitral valve regurgitation.  6. The tricuspid valve is abnormal. Tricuspid valve regurgitation is moderate to severe.  7. The aortic valve is abnormal. Aortic valve regurgitation is trivial. Mild to moderate aortic valve stenosis. Conclusion(s)/Recommendation(s): Findings consistent with dilated cardiomyopathy. FINDINGS  Left Ventricle: Left ventricular ejection fraction, by estimation, is 25 to 30%. The left ventricle has severely decreased function. The  left ventricle demonstrates global hypokinesis. The left ventricular internal cavity size was severely dilated. There is mild concentric left ventricular hypertrophy. Left ventricular diastolic parameters are consistent with Grade III diastolic dysfunction (restrictive). Right Ventricle: The right ventricular size is moderately enlarged. Mildly increased right ventricular wall thickness. Right ventricular systolic function is mildly reduced. Left Atrium: Left atrial size was moderately dilated. Right Atrium: Right atrial size was moderately dilated. Pericardium: There is no evidence of pericardial effusion. Mitral Valve: The mitral valve is grossly normal. Moderate to severe mitral valve regurgitation. MV peak gradient, 6.5 mmHg. The mean mitral valve gradient is 3.0 mmHg. Tricuspid Valve: The tricuspid valve is abnormal. Tricuspid valve regurgitation is moderate to severe. Aortic Valve: The aortic valve is abnormal. Aortic valve regurgitation is trivial. Mild to moderate aortic stenosis is present. Aortic valve mean gradient measures 18.0 mmHg. Aortic valve peak gradient measures 36.0 mmHg. Aortic valve area, by VTI measures 1.20 cm. Pulmonic Valve: The pulmonic valve was grossly normal. Pulmonic valve regurgitation is mild. Aorta: The aortic root was not well visualized. IAS/Shunts: No atrial level shunt detected by color flow Doppler.  LEFT VENTRICLE PLAX 2D  LVIDd:         4.88 cm  Diastology LVIDs:         4.25 cm  LV e' medial:    5.22 cm/s LV PW:         1.21 cm  LV E/e' medial:  23.1 LV IVS:        0.95 cm  LV e' lateral:   9.46 cm/s LVOT diam:     2.50 cm  LV E/e' lateral: 12.7 LV SV:         62 LV SV Index:   32 LVOT Area:     4.91 cm  RIGHT VENTRICLE RV Basal diam:  3.57 cm LEFT ATRIUM             Index       RIGHT ATRIUM           Index LA diam:        3.70 cm 1.93 cm/m  RA Area:     15.80 cm LA Vol (A2C):   54.2 ml 28.23 ml/m RA Volume:   43.30 ml  22.55 ml/m LA Vol (A4C):   37.6 ml 19.58 ml/m LA Biplane Vol: 46.3 ml 24.11 ml/m  AORTIC VALVE                    PULMONIC VALVE AV Area (Vmax):    1.10 cm     PV Vmax:       0.63 m/s AV Area (Vmean):   1.16 cm     PV Vmean:      40.800 cm/s AV Area (VTI):     1.20 cm     PV VTI:        0.092 m AV Vmax:           300.00 cm/s  PV Peak grad:  1.6 mmHg AV Vmean:          194.000 cm/s PV Mean grad:  1.0 mmHg AV VTI:            0.517 m AV Peak Grad:      36.0 mmHg AV Mean Grad:      18.0 mmHg LVOT Vmax:         67.20 cm/s LVOT Vmean:        45.900 cm/s LVOT VTI:  0.126 m LVOT/AV VTI ratio: 0.24  AORTA Ao Root diam: 3.30 cm MITRAL VALVE                TRICUSPID VALVE MV Area (PHT): 7.25 cm     TR Peak grad:   49.0 mmHg MV Peak grad:  6.5 mmHg     TR Vmax:        350.00 cm/s MV Mean grad:  3.0 mmHg MV Vmax:       1.27 m/s     SHUNTS MV Vmean:      86.2 cm/s    Systemic VTI:  0.13 m MV Decel Time: 105 msec     Systemic Diam: 2.50 cm MV E velocity: 120.33 cm/s MV A velocity: 95.30 cm/s MV E/A ratio:  1.26 Adrian Blackwater MD Electronically signed by Adrian Blackwater MD Signature Date/Time: 08/24/2020/6:57:23 PM    Final    Medications:  I have reviewed the patient's current medications. Prior to Admission:  Medications Prior to Admission  Medication Sig Dispense Refill Last Dose  . acetaminophen (TYLENOL) 325 MG tablet Take 650 mg by mouth every 6 (six) hours as needed for mild pain, fever or headache.    08/22/2020 at 0800  . Apoaequorin (PREVAGEN EXTRA STRENGTH PO) Take 1 tablet by mouth See admin instructions. Chew 1 tablet by mouth every evening   08/22/2020 at 0800  . aspirin EC 81 MG tablet Take 1 tablet (81 mg total) by mouth daily.   08/22/2020 at 0800  . cholecalciferol (VITAMIN D3) 25 MCG (1000 UNIT) tablet Take 1,000 Units by mouth daily.   08/22/2020 at 0800  . doxycycline (VIBRAMYCIN) 100 MG capsule Take 100 mg by mouth 2 (two) times daily.   Past Week at Unknown time  . enalapril (VASOTEC) 2.5 MG tablet Take 2.5 mg by mouth daily.   08/22/2020 at 0800  . fluticasone (FLONASE) 50 MCG/ACT nasal spray Place 2 sprays into both nostrils daily.   08/22/2020 at 0800  . furosemide (LASIX) 20 MG tablet Take 1 tablet (20 mg total) by mouth every other day. 15 tablet 2 08/22/2020 at 0630  . metoprolol succinate (TOPROL-XL) 25 MG 24 hr tablet Take 25 mg by mouth daily.   08/22/2020 at 0800  . omeprazole (PRILOSEC) 20 MG capsule Take 20 mg by mouth at bedtime.    08/22/2020 at 0800  . ipratropium (ATROVENT) 0.06 % nasal spray Place 2 sprays into both nostrils at bedtime. (Patient not taking: Reported on 09/05/2020)   Not Taking at Unknown time  . loratadine (CLARITIN) 10 MG tablet Take 10 mg by mouth daily. (Patient not taking: Reported on 08/30/2020)   Not Taking at Unknown time   Scheduled: . Chlorhexidine Gluconate Cloth  6 each Topical Daily  . enoxaparin (LOVENOX) injection  40 mg Subcutaneous Q24H  . feeding supplement  237 mL Oral TID BM  . fluticasone  2 spray Each Nare Daily  . hydrocortisone sod succinate (SOLU-CORTEF) inj  50 mg Intravenous Q6H  . ipratropium  2 spray Each Nare QHS  . midodrine  10 mg Oral TID WC  . multivitamin with minerals  1 tablet Oral Daily  . pantoprazole (PROTONIX) IV  40 mg Intravenous Q12H  . polyethylene glycol  17 g Oral Daily  . sodium chloride flush  3 mL Intravenous Q12H   Continuous: . sodium chloride    . sodium chloride    .  ampicillin-sulbactam (UNASYN) IV Stopped (08/25/20 0544)  . norepinephrine (LEVOPHED) Adult infusion 6 mcg/min (  08/25/20 0800)   ZOX:WRUEAVPRN:sodium chloride, acetaminophen, haloperidol lactate, ipratropium-albuterol, sodium chloride flush, traZODone Anti-infectives (From admission, onward)   Start     Dose/Rate Route Frequency Ordered Stop   08/24/20 0130  cefTRIAXone (ROCEPHIN) 2 g in sodium chloride 0.9 % 100 mL IVPB  Status:  Discontinued        2 g 200 mL/hr over 30 Minutes Intravenous Every 24 hours 03/26/2020 1444 03/26/2020 2138   08/24/20 0000  Ampicillin-Sulbactam (UNASYN) 3 g in sodium chloride 0.9 % 100 mL IVPB        3 g 200 mL/hr over 30 Minutes Intravenous Every 6 hours 03/26/2020 2148     03/26/2020 1445  azithromycin (ZITHROMAX) 500 mg in sodium chloride 0.9 % 250 mL IVPB  Status:  Discontinued        500 mg 250 mL/hr over 60 Minutes Intravenous Every 24 hours 03/26/2020 1444 08/24/20 1019   03/26/2020 1330  ceFEPIme (MAXIPIME) 2 g in sodium chloride 0.9 % 100 mL IVPB        2 g 200 mL/hr over 30 Minutes Intravenous  Once 03/26/2020 1316 03/26/2020 1421   03/26/2020 1330  piperacillin-tazobactam (ZOSYN) IVPB 3.375 g        3.375 g 12.5 mL/hr over 240 Minutes Intravenous Once 03/26/2020 1316 03/26/2020 1525   03/26/2020 1330  vancomycin (VANCOCIN) IVPB 1000 mg/200 mL premix        1,000 mg 200 mL/hr over 60 Minutes Intravenous  Once 03/26/2020 1316 03/26/2020 1525     Scheduled Meds: . Chlorhexidine Gluconate Cloth  6 each Topical Daily  . enoxaparin (LOVENOX) injection  40 mg Subcutaneous Q24H  . feeding supplement  237 mL Oral TID BM  . fluticasone  2 spray Each Nare Daily  . hydrocortisone sod succinate (SOLU-CORTEF) inj  50 mg Intravenous Q6H  . ipratropium  2 spray Each Nare QHS  . midodrine  10 mg Oral TID WC  . multivitamin with minerals  1 tablet Oral Daily  . pantoprazole (PROTONIX) IV  40 mg Intravenous Q12H  . polyethylene glycol  17 g Oral Daily  . sodium chloride flush  3 mL Intravenous  Q12H   Continuous Infusions: . sodium chloride    . sodium chloride    . ampicillin-sulbactam (UNASYN) IV Stopped (08/25/20 0544)  . norepinephrine (LEVOPHED) Adult infusion 6 mcg/min (08/25/20 0800)   PRN Meds:.sodium chloride, acetaminophen, haloperidol lactate, ipratropium-albuterol, sodium chloride flush, traZODone   Assessment: Principal Problem:   Severe sepsis with septic shock (HCC) Active Problems:   Chronic kidney disease, stage 3a (HCC)   Rectal bleeding   Chronic systolic CHF (congestive heart failure) (HCC)   Respiratory failure with hypoxia (HCC)   Goals of care, counseling/discussion   Palliative care by specialist   Advanced care planning/counseling discussion   Failure to thrive in adult   Protein-calorie malnutrition, severe   Juliann PulseFrederick M Rauf is a 86 y.o. pleasant Caucasian male with CHF, cardiomyopathy EF of 35 to 40%, diastolic heart failure, history of carotid endarterectomy, moderate to severe aortic stenosis, history of pneumonia in September 2021, recent severe constipation, impacted stool s/p manual disimpaction on 11/11 is admitted with septic shock and rectal bleeding  Plan: Rectal bleeding: Most likely secondary to stercoral ulcer This has currently resolved.  Patient is having regular bowel movements Continue MiraLAX daily to avoid constipation If there is recurrence of rectal bleeding, recommend sigmoidoscopy without sedation when patient is clinically stable if agreeable with his family. GI will sign off at this time,  please call us back with questions or concerns   LOS: 2 days   Laurin Morgenstern 08/25/2020, 11:53 AM

## 2020-08-26 DIAGNOSIS — Z515 Encounter for palliative care: Secondary | ICD-10-CM | POA: Diagnosis not present

## 2020-08-26 DIAGNOSIS — J9601 Acute respiratory failure with hypoxia: Secondary | ICD-10-CM | POA: Diagnosis not present

## 2020-08-26 DIAGNOSIS — Z7189 Other specified counseling: Secondary | ICD-10-CM | POA: Diagnosis not present

## 2020-08-26 DIAGNOSIS — A419 Sepsis, unspecified organism: Secondary | ICD-10-CM | POA: Diagnosis not present

## 2020-08-26 DIAGNOSIS — I5043 Acute on chronic combined systolic (congestive) and diastolic (congestive) heart failure: Secondary | ICD-10-CM | POA: Diagnosis not present

## 2020-08-26 DIAGNOSIS — J690 Pneumonitis due to inhalation of food and vomit: Secondary | ICD-10-CM | POA: Diagnosis not present

## 2020-08-26 DIAGNOSIS — E871 Hypo-osmolality and hyponatremia: Secondary | ICD-10-CM

## 2020-08-26 DIAGNOSIS — R6521 Severe sepsis with septic shock: Secondary | ICD-10-CM | POA: Diagnosis not present

## 2020-08-26 LAB — BASIC METABOLIC PANEL
Anion gap: 11 (ref 5–15)
BUN: 31 mg/dL — ABNORMAL HIGH (ref 8–23)
CO2: 27 mmol/L (ref 22–32)
Calcium: 9.1 mg/dL (ref 8.9–10.3)
Chloride: 101 mmol/L (ref 98–111)
Creatinine, Ser: 1.25 mg/dL — ABNORMAL HIGH (ref 0.61–1.24)
GFR, Estimated: 54 mL/min — ABNORMAL LOW (ref >60)
Glucose, Bld: 145 mg/dL — ABNORMAL HIGH (ref 70–99)
Potassium: 3.4 mmol/L — ABNORMAL LOW (ref 3.5–5.1)
Sodium: 139 mmol/L (ref 135–145)

## 2020-08-26 LAB — CBC WITH DIFFERENTIAL/PLATELET
Abs Immature Granulocytes: 0.1 10*3/uL — ABNORMAL HIGH (ref 0.00–0.07)
Basophils Absolute: 0 10*3/uL (ref 0.0–0.1)
Basophils Relative: 0 %
Eosinophils Absolute: 0 10*3/uL (ref 0.0–0.5)
Eosinophils Relative: 0 %
HCT: 32 % — ABNORMAL LOW (ref 39.0–52.0)
Hemoglobin: 10.7 g/dL — ABNORMAL LOW (ref 13.0–17.0)
Immature Granulocytes: 1 %
Lymphocytes Relative: 4 %
Lymphs Abs: 0.5 10*3/uL — ABNORMAL LOW (ref 0.7–4.0)
MCH: 31.5 pg (ref 26.0–34.0)
MCHC: 33.4 g/dL (ref 30.0–36.0)
MCV: 94.1 fL (ref 80.0–100.0)
Monocytes Absolute: 0.5 10*3/uL (ref 0.1–1.0)
Monocytes Relative: 4 %
Neutro Abs: 11 10*3/uL — ABNORMAL HIGH (ref 1.7–7.7)
Neutrophils Relative %: 91 %
Platelets: 212 10*3/uL (ref 150–400)
RBC: 3.4 MIL/uL — ABNORMAL LOW (ref 4.22–5.81)
RDW: 13.8 % (ref 11.5–15.5)
WBC: 12.1 10*3/uL — ABNORMAL HIGH (ref 4.0–10.5)
nRBC: 0 % (ref 0.0–0.2)

## 2020-08-26 LAB — MAGNESIUM: Magnesium: 2.1 mg/dL (ref 1.7–2.4)

## 2020-08-26 LAB — PHOSPHORUS: Phosphorus: 3.9 mg/dL (ref 2.5–4.6)

## 2020-08-26 MED ORDER — PHENYLEPHRINE HCL-NACL 10-0.9 MG/250ML-% IV SOLN
25.0000 ug/min | INTRAVENOUS | Status: DC
  Filled 2020-08-26: qty 250

## 2020-08-26 MED ORDER — POTASSIUM CHLORIDE 10 MEQ/100ML IV SOLN
10.0000 meq | INTRAVENOUS | Status: AC
  Administered 2020-08-26 (×2): 10 meq via INTRAVENOUS
  Filled 2020-08-26 (×2): qty 100

## 2020-08-26 MED ORDER — PANTOPRAZOLE SODIUM 40 MG PO TBEC
40.0000 mg | DELAYED_RELEASE_TABLET | Freq: Two times a day (BID) | ORAL | Status: DC
  Administered 2020-08-26 – 2020-08-29 (×6): 40 mg via ORAL
  Filled 2020-08-26 (×6): qty 1

## 2020-08-26 MED ORDER — ALBUMIN HUMAN 25 % IV SOLN
25.0000 g | Freq: Two times a day (BID) | INTRAVENOUS | Status: DC
  Administered 2020-08-26 – 2020-08-29 (×6): 25 g via INTRAVENOUS
  Filled 2020-08-26 (×5): qty 100

## 2020-08-26 MED ORDER — AMIODARONE LOAD VIA INFUSION
150.0000 mg | Freq: Once | INTRAVENOUS | Status: DC
  Filled 2020-08-26: qty 83.34

## 2020-08-26 MED ORDER — PIPERACILLIN-TAZOBACTAM 3.375 G IVPB 30 MIN: 3.3750 g | Freq: Three times a day (TID) | INTRAVENOUS | Status: DC

## 2020-08-26 MED ORDER — AMIODARONE IV BOLUS ONLY 150 MG/100ML
INTRAVENOUS | Status: AC
  Administered 2020-08-26: 150 mg
  Filled 2020-08-26: qty 100

## 2020-08-26 MED ORDER — AMIODARONE HCL IN DEXTROSE 360-4.14 MG/200ML-% IV SOLN
30.0000 mg/h | INTRAVENOUS | Status: DC
  Administered 2020-08-26: 60 mg/h via INTRAVENOUS
  Administered 2020-08-27 – 2020-08-29 (×4): 30 mg/h via INTRAVENOUS
  Filled 2020-08-26 (×5): qty 200

## 2020-08-26 MED ORDER — DIGOXIN 0.25 MG/ML IJ SOLN
0.2500 mg | Freq: Once | INTRAMUSCULAR | Status: AC
  Administered 2020-08-26: 0.25 mg via INTRAVENOUS
  Filled 2020-08-26: qty 2

## 2020-08-26 MED ORDER — PIPERACILLIN-TAZOBACTAM 3.375 G IVPB
3.3750 g | Freq: Three times a day (TID) | INTRAVENOUS | Status: DC
  Administered 2020-08-26 – 2020-08-29 (×7): 3.375 g via INTRAVENOUS
  Filled 2020-08-26 (×7): qty 50

## 2020-08-26 MED ORDER — AMIODARONE HCL IN DEXTROSE 360-4.14 MG/200ML-% IV SOLN
60.0000 mg/h | INTRAVENOUS | Status: DC
  Administered 2020-08-26: 60 mg/h via INTRAVENOUS
  Filled 2020-08-26: qty 200

## 2020-08-26 MED ORDER — DIGOXIN 250 MCG PO TABS
0.2500 mg | ORAL_TABLET | Freq: Every day | ORAL | Status: DC
  Administered 2020-08-26 – 2020-08-29 (×4): 0.25 mg via ORAL
  Filled 2020-08-26 (×5): qty 1

## 2020-08-26 NOTE — Progress Notes (Signed)
SUBJECTIVE: Patient resting in bed. Denies chest pain or shortness of breath but continues to require supplemental oxygen. Patient appears to be developing some delirium as he stated he went on a "trip" last night.   Vitals:   08/26/20 0615 08/26/20 0630 08/26/20 0645 08/26/20 0800  BP:  (!) 94/59  91/69  Pulse: 92 90 92 (!) 46  Resp: 20 14 16 16   Temp:    97.6 F (36.4 C)  TempSrc:    Oral  SpO2: 91% 90% 90% 91%  Weight:      Height:        Intake/Output Summary (Last 24 hours) at 08/26/2020 1029 Last data filed at 08/26/2020 0807 Gross per 24 hour  Intake 380.45 ml  Output 1525 ml  Net -1144.55 ml    LABS: Basic Metabolic Panel: Recent Labs    08/24/20 0537 08/24/20 0537 08/25/20 0659 08/26/20 0545  NA 136   < > 136 139  K 4.3   < > 4.0 3.4*  CL 102   < > 102 101  CO2 23   < > 23 27  GLUCOSE 123*   < > 142* 145*  BUN 32*   < > 26* 31*  CREATININE 1.38*   < > 1.27* 1.25*  CALCIUM 9.6   < > 9.4 9.1  MG 2.2  --   --  2.1  PHOS  --   --   --  3.9   < > = values in this interval not displayed.   Liver Function Tests: Recent Labs    09/04/2020 1115  AST 37  ALT 18  ALKPHOS 81  BILITOT 1.3*  PROT 6.3*  ALBUMIN 2.6*   No results for input(s): LIPASE, AMYLASE in the last 72 hours. CBC: Recent Labs    09/03/2020 1115 08/24/20 0537 08/25/20 0659 08/26/20 0545  WBC 36.3*   36.1*   < > 19.6* 12.1*  NEUTROABS 33.0*  --   --  11.0*  HGB 13.8   14.0   < > 11.9* 10.7*  HCT 41.7   41.6   < > 36.3* 32.0*  MCV 92.9   92.2   < > 94.3 94.1  PLT 372   377   < > 266 212   < > = values in this interval not displayed.   Cardiac Enzymes: No results for input(s): CKTOTAL, CKMB, CKMBINDEX, TROPONINI in the last 72 hours. BNP: Invalid input(s): POCBNP D-Dimer: No results for input(s): DDIMER in the last 72 hours. Hemoglobin A1C: No results for input(s): HGBA1C in the last 72 hours. Fasting Lipid Panel: No results for input(s): CHOL, HDL, LDLCALC, TRIG, CHOLHDL,  LDLDIRECT in the last 72 hours. Thyroid Function Tests: No results for input(s): TSH, T4TOTAL, T3FREE, THYROIDAB in the last 72 hours.  Invalid input(s): FREET3 Anemia Panel: Recent Labs    09/02/2020 1947  VITAMINB12 136*  FOLATE 8.4  FERRITIN 386*  TIBC 146*  IRON 24*  RETICCTPCT 2.2     PHYSICAL EXAM General: Well developed, well nourished, in no acute distress HEENT:  Normocephalic and atramatic Neck:  No JVD.  Lungs: fine crackles bibasilar. Heart: ST with frequent ectopy . Normal S1 and S2 without gallops or murmurs.  Abdomen: Bowel sounds are positive, abdomen soft and non-tender  Msk:  Back normal, normal gait. Normal strength and tone for age. Extremities: No clubbing, cyanosis or edema.   Neuro: Alert and oriented X 2. Psych:  Good affect, responds appropriately  TELEMETRY: ST with frequent PACs. 102/bpm  ASSESSMENT AND PLAN: Patient presenting to the emergency department with progressive generalized weakness found to have pneumonia sepsis. Patient successfully weaned of vasopressors yesterday. Patient remains tachycardic with frequent ectopy. Will start digoxin 0.250mg  daily as the patient remains mildly hypotensive. Recent BNP elevated, patient with recovering PNA sepsis, but now exhibiting possible HFrEF exacerbation. Patient was started on a furosemide infusion yesterday. Patient can most likely be transitioned to bid dosing. Wife at bedside, all questions answered.We will continue to follow.  Principal Problem:   Severe sepsis with septic shock (HCC) Active Problems:   Chronic kidney disease, stage 3a (HCC)   Rectal bleeding   Acute on chronic combined systolic and diastolic CHF (congestive heart failure) (HCC)   Acute hypoxemic respiratory failure (HCC)   Goals of care, counseling/discussion   Palliative care by specialist   Advanced care planning/counseling discussion   Failure to thrive in adult   Protein-calorie malnutrition, severe   Aspiration  pneumonia of both lower lobes due to gastric secretions (HCC)    Maryelizabeth Kaufmann, NP-C 08/26/2020 10:29 AM

## 2020-08-26 NOTE — TOC Initial Note (Signed)
Transition of Care [facility]) - Initial/Assessment Note    Patient Details  Name: Jeffery Smith MRN: [id number] Date of Birth: 11-Jun-1925  Transition of Care [facility]) CM/SW Contact:    Marina Goodell Phone Number:  [phone] 08/26/2020, 3:41 PM  Clinical Narrative:                  CSW called patient's Mercer, Peifer (Spouse) [phone] and [name] (daughter) [phone] for [facility] assessment.  CSW left voicemail requesting call back.       Patient Goals and CMS Choice        Expected Discharge Plan and Services                                                Prior Living Arrangements/Services                       Activities of Daily Living Home Assistive Devices/Equipment: Cane (specify quad or straight) (straight) ADL Screening (condition at time of admission) Patient's cognitive ability adequate to safely complete daily activities?: Yes Is the patient deaf or have difficulty hearing?: Yes Does the patient have difficulty seeing, even when wearing glasses/contacts?: No Does the patient have difficulty concentrating, remembering, or making decisions?: Yes Patient able to express need for assistance with ADLs?: Yes Does the patient have difficulty dressing or bathing?: Yes Independently performs ADLs?: No Communication: Independent Dressing (OT): Needs assistance Is this a change from baseline?: Change from baseline, expected to last <3days Grooming: Needs assistance Is this a change from baseline?: Change from baseline, expected to last <3 days Toileting: Needs assistance Is this a change from baseline?: Change from baseline, expected to last <3 days Does the patient have difficulty walking or climbing stairs?: Yes Weakness of Legs: Both Weakness of Arms/Hands: Both  Permission Sought/Granted                  Emotional Assessment              Admission diagnosis:  Recurrent pneumonia [J18.9] Respiratory failure  with hypoxia (HCC) [J96.91] Generalized weakness [R53.1] Sepsis, due to unspecified organism, unspecified whether acute organ dysfunction present [facility]) [A41.9] Patient Active Problem List   Diagnosis Date Noted  . Protein-calorie malnutrition, severe 08/25/2020  . Aspiration pneumonia of both lower lobes due to gastric secretions (HCC) 08/25/2020  . Goals of care, counseling/discussion   . Palliative care by specialist   . Advanced care planning/counseling discussion   . Failure to thrive in adult   . Generalized weakness 08/26/2020  . Rectal bleeding 08/11/2020  . Leukemoid reaction 08/25/2020  . Acute on chronic combined systolic and diastolic CHF (congestive heart failure) (HCC) 08/20/2020  . Acute hypoxemic respiratory failure (HCC) 09/03/2020  . Severe sepsis with septic shock (HCC) 08/09/2020  . Acute exacerbation of CHF (congestive heart failure) (HCC) 07/12/2020  . Hyperlipidemia   . Hard of hearing   . GERD (gastroesophageal reflux disease)   . Elevated brain natriuretic peptide (BNP) level   . Anemia due to chronic kidney disease   . Acute CHF (congestive heart failure) (HCC) 06/28/2020  . Acute respiratory failure with hypoxia (HCC) 06/28/2020  . Acute on chronic diastolic CHF (congestive heart failure) (HCC) 06/28/2020  . Moderate to severe aortic stenosis 06/28/2020  . H/O carotid endarterectomy 06/28/2020  . Elevated troponin 06/28/2020  .  Chronic kidney disease, stage 3a (HCC) 06/28/2020  . OA (osteoarthritis) of knee 09/21/2014  . Occlusion and stenosis of carotid artery without mention of cerebral infarction 11/21/2011   PCP:  System, Provider Not In Pharmacy:   [facility] [place]. - [place], [place] - [address] [place] [address] Phone: [phone] Fax: [phone]     Social Determinants of Health (SDOH) Interventions    Readmission Risk Interventions No flowsheet data found.

## 2020-08-26 NOTE — Progress Notes (Signed)
Patient complaining of shortness of breath and difficulty taking deep breath.  02 sats 82-84%.  Heart rate remains 140-160-'s.  Dr. Chipper Herb notified again of current issues.

## 2020-08-26 NOTE — Progress Notes (Signed)
PT Cancellation Note  Patient Details Name: Jeffery Smith MRN: [id number] DOB: [birth year]   Cancelled Treatment:    Reason Eval/Treat Not Completed: Other (comment).  PT consult received.  Chart reviewed.  Pt weaned off vasopressors yesterday--pt's BP 91/55 resting in bed.  Per cardiology note today pt remains tachycardic with frequent ectopy.  Per discussion with pt's nurse, plan for family meeting today.  D/t low BP/current medical status, will hold PT at this time and re-attempt PT evaluation at a later date/time as medically appropriate.  Hendricks Limes, PT 08/26/20, 11:53 AM

## 2020-08-26 NOTE — Progress Notes (Signed)
Stat EKG obtained, patient changed from bubble high flow to traditional high flow 45L 100%,  Amiodarone bolus started per MD order.

## 2020-08-26 NOTE — Progress Notes (Signed)
Digoxin IV given per Dr. Chipper Herb order.

## 2020-08-26 NOTE — Progress Notes (Addendum)
Speech Language Pathology Treatment: Dysphagia  Patient Details Name: Jeffery Smith MRN: [id number] DOB: 1924/11/15 Today's Date: 08/26/2020 Time: 1300-1330 SLP Time Calculation (min) (ACUTE ONLY): 30 min  Assessment / Plan / Recommendation Clinical Impression  Pt was seen bedside in ICU for f/u toleration of Dysphagia 3 diet w/ thin liquids. No Immediate, Overt clinical s/s of aspiration were noted. Recommend continuation of Dysphagia 3 Diet (Mech soft w/ well-cut meats) w/ thin liquids VIA CUP; No straws. Pt was awake & alert upon clinician arrival to room; Wife present. Verbose -- he frequently stated he "could not eat the food because it's cold". His Wife stated the food MUST be HOT for him to eat it. Pt able to follow simple commands independently. Unsure of pt cognitive baseline. Unsure of pt hearing baseline, but pt understanding of verbal cues appeared improved w/ louder verbalization from clinician.   Pt continues to present w/ a phlegmy and productive cough intermittently noted by spit cup w/ expectorates at bedside. No coughing noted at time of treatment or throat clearing of phlegm during this session. Pt vocal quality WNL baseline. Per interview w/ pt & wife, pt did not eat his breakfast this morning d/t complaints of it being "too cold". Per pt & wife, pt is feeling cold and is only requesting hot/warmed foods at this time-- NSG updated on this preference. Consistent w/ pt's diminished interest in POs noted during evaluation 11/16. Pt was observed w/ PO trials of 10x+ sips of thin liquids via cup(hot chocolate)-- no overt clinical s/s of aspiration were noted. Pt able to self-feed thin liquid consistency w/ set up assist, including upright positioning in bed. When clinicians attempted hand-over-hand for self-feeding support, pt requested "let me control the rate". Pt self-fed adequately for the duration of the session-- independent engagement of safe swallowing strategies of slow  rate of drinking, rest breaks, and small sips noted. Oral Phase grossly WFL: Timely A-P transfer of bolus, awareness of bolus WFL, mastication of solid trialsWFL, no oral residue between POs. Oral prep WFL: pt exhibiting appropriate awareness of bolus and ability to self-feed w/ set up assist.No pharyngeal s/s. Speech was clear between all above PO's. No noted increase in WOB/SOB.   Recommend continuation of Dysphagia 3 (mech soft) diet w/ Thin liquids for safer swallowing; general aspiration precautions; pills whole in puree. No Straws at this time for safer swallowing. Recommend following general aspiration precautions, emphasis on: sitting upright in bed, slow rate of drinking/eating, small bites for increased bolus management, moistening solid foods, and alternating solids/liquids for complete clearance of bolus. Recommend intermittent supervision at mealtime to cue for compensatory strategies. NSG/MD updated. Education provided--posted in room. ST services to sign off at this time. NSG to reconsult for further needs.    HPI HPI: Per admitting H&P: "is a 86 y.o. male with medical history significant for cardiomyopathy with last known LVEF of 35% - 40%, patient has a LifeVest, moderate to severe aortic stenosis, GERD and BPH who was sent to the emergency room by his primary care provider for evaluation of generalized weakness, mid rectal bleeding and diarrhea for 2 days.  Patient was seen in the emergency room at [facility] over the weekend for constipation and was found to have fecal impaction.  He was manually disimpacted and received an enema and was discharged home.  His wife states that over the last 2 days following the enema he has had multiple episodes of diarrhea stool containing bright red blood.  He does not  have a history of hemorrhoids and denies having any abdominal pain.  He denies any NSAID use."   CXR (11/16): "Diffuse severe bilateral pulmonary infiltrates again noted without interim  change.; Small wedge-shaped densities noted over the right upper and left lower lungs. Although these changes may be secondary to atelectasis, pulmonary infarcts cannot be excluded."  CT Head (10/05/2015): "Mild atrophy and chronic microvascular ischemic white matter disease."       SLP Plan  All goals met       Recommendations  Diet recommendations: Dysphagia 3 (mechanical soft);Thin liquid Liquids provided via: Cup;No straw Medication Administration: Whole meds with puree Supervision: Patient able to self feed;Intermittent supervision to cue for compensatory strategies Compensations: Minimize environmental distractions;Slow rate;Small sips/bites;Follow solids with liquid Postural Changes and/or Swallow Maneuvers: Seated upright 90 degrees;Upright 30-60 min after meal                Oral Care Recommendations: Oral care BID Follow up Recommendations: None SLP Visit Diagnosis: Dysphagia, unspecified (R13.10) Plan: All goals met       GO                Quintella Baton  Graduate Clinician 08/26/2020, 2:35 PM  The information in this patient note, response to treatment, and overall treatment plan developed has been reviewed and agreed upon after reviewing documentation. This session was performed under the supervision of this licensed clinician.   Orinda Kenner, MS, [place] Speech Language Pathologist Rehab Services [phone]

## 2020-08-26 NOTE — Progress Notes (Signed)
Dr. Chipper Herb at bedside.  Order received to transfer patient back to ICU status.  Dr. Chipper Herb updating Cheryll Cockayne, NP

## 2020-08-26 NOTE — Progress Notes (Signed)
Daily Progress Note   Patient Name: Jeffery Smith       Date: 08/26/2020 DOB: September 21, 1925  Age: 86 y.o. MRN#: [id number] Attending Physician: Sharen Hones, MD Primary Care Physician: System, Provider Not In Admit Date: 08/24/2020  Reason for Consultation/Follow-up: Establishing goals of care  Subjective: Met at bedside with spouse- Jeffery Smith- daughter- Jeffery Smith, and with daughter Jeffery Smith on the phone. Jeffery Smith sleeping today, some noted confusion when he is awake- unable to participate in [place] discussion.  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 He has written a book. 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  Jeffery Smith and daughters describe his significant decline over the last year. He has lost weight. 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 We reviewed his current illness and possible trajectories. Discussion was had regarding continued aggressive medical care versus transition to comfort. Family's main goal would be to continue attempts to stabilize patient so that he could discharge home with hospice. CODE STATUS was discussed.  Family understands that if patient is to undergo a full code he would likely be left on the ventilator and they would have to make a decision to take him off the ventilator.  They are able to verbalize that a full code would not improve patient's status or meet his goals of living and being awake and able to interact with the family.  However patient has always expressed his desire for full code due to feeling regretful when he made his way previous wife DNR and did not realize she was  dying in the hospital.  They feel very strongly about respecting his wishes in this aspect.  However they are agreeable to transitioning him to DO NOT RESUSCITATE if he is able to be discharged home with hospice.  If he codes while in the hospital, and he ends up on life support, they would make a decision to transition to full comfort and withdraw.  Their main goal for continuing full code would be to respect what his feelings were.   Review of Systems  Unable to perform ROS: Medical condition  Length of Stay: 3  Current Medications: Scheduled Meds:  . Chlorhexidine Gluconate Cloth  6 each Topical Daily  . digoxin  0.25 mg Oral Daily  . enoxaparin (LOVENOX) injection  40 mg Subcutaneous Q24H  . feeding supplement  237 mL Oral TID BM  . fluticasone  2 spray Each Nare Daily  . hydrocortisone sod succinate (SOLU-CORTEF) inj  50 mg Intravenous Q6H  . ipratropium  2 spray Each Nare QHS  . midodrine  10 mg Oral TID WC  . multivitamin with minerals  1 tablet Oral Daily  . pantoprazole  40 mg Oral BID  . polyethylene glycol  17 g Oral Daily  . sodium chloride flush  3 mL Intravenous Q12H  . vitamin B-12  1,000 mcg Oral Daily    Continuous Infusions: . sodium chloride    . sodium chloride    . ampicillin-sulbactam (UNASYN) IV Stopped (08/26/20 1358)  . furosemide (LASIX) 200 mg in dextrose 5% 100 mL (41m/mL) infusion 4 mg/hr (08/26/20 1400)  . norepinephrine (LEVOPHED) Adult infusion Stopped (08/25/20 0933)    PRN Meds: sodium chloride, acetaminophen, haloperidol lactate, ipratropium-albuterol, sodium chloride flush, traZODone  Physical Exam Vitals and nursing note reviewed.  Constitutional:      General: He is not in acute distress.    Comments: lethargic  Pulmonary:     Effort: Pulmonary effort is normal.             Vital Signs: BP 94/64   Pulse (!) 58   Temp 97.6 F (36.4 C) (Oral)   Resp (!) 35   Ht 6' 1"  (1.854 m)   Wt 70.1 kg   SpO2 91%   BMI 20.39 kg/m  SpO2:  SpO2: 91 % O2 Device: O2 Device: High Flow Nasal Cannula O2 Flow Rate: O2 Flow Rate (L/min): 10 L/min  Intake/output summary:   Intake/Output Summary (Last 24 hours) at 08/26/2020 1441 Last data filed at 08/26/2020 1400 Gross per 24 hour  Intake 747.72 ml  Output 1525 ml  Net -777.28 ml   LBM: Last BM Date: 08/26/20 Baseline Weight: Weight: 72.1 kg Most recent weight: Weight: 70.1 kg       Palliative Assessment/Data: PPS: 30%      Patient Active Problem List   Diagnosis Date Noted  . Protein-calorie malnutrition, severe 08/25/2020  . Aspiration pneumonia of both lower lobes due to gastric secretions ([place] 08/25/2020  . Goals of care, counseling/discussion   . Palliative care by specialist   . Advanced care planning/counseling discussion   . Failure to thrive in adult   . Generalized weakness 08/25/2020  . Rectal bleeding 09/04/2020  . Leukemoid reaction 09/05/2020  . Acute on chronic combined systolic and diastolic CHF (congestive heart failure) ([place] 08/15/2020  . Acute hypoxemic respiratory failure ([facility] 09/05/2020  . Severe sepsis with septic shock ([place] 08/11/2020  . Acute exacerbation of CHF (congestive heart failure) ([place] 07/12/2020  . Hyperlipidemia   . Hard of hearing   . GERD (gastroesophageal reflux disease)   . Elevated brain natriuretic peptide (BNP) level   . Anemia due to chronic kidney disease   . Acute CHF (congestive heart failure) ([place] 06/28/2020  . Acute respiratory failure with hypoxia (HS[place] 06/28/2020  . Acute on chronic diastolic CHF (congestive heart failure) ([place] 06/28/2020  . Moderate to severe aortic stenosis 06/28/2020  . H/O carotid endarterectomy 06/28/2020  . Elevated troponin 06/28/2020  . Chronic kidney disease, stage 3a (HS[place] 06/28/2020  . OA (osteoarthritis) of knee 09/21/2014  . Occlusion  and stenosis of carotid artery without mention of cerebral infarction 11/21/2011    Palliative Care Assessment & Plan   Patient  Profile: 85 y.o. male  with past medical history of CHF EF 35-40%, Lifevest, mod to severe AS, GERD, BPH, HLD, very HOH admitted on 08/28/2020 with progressing weakness, poor po intake, rectal bleeding.  CT scan worrisome for rectal thickening, recommending sigmoidoscopy, GI has been consulted.  He had recent ED visit for fecal impaction. Workup reveals septic shock due to pneumonia. He is requiring vasopressors for BP support. SLP eval did not show signs of aspiration. Palliative medicine consulted for [place].    Assessment/Recommendations/Plan   Continue current scope of care  GOC are to stabilize patient and discharge home with Hospice  PMT will continue to follow and continue discussions related to code status and overall goals of care   Code Status:  Full code  Prognosis:   Unable to determine  Discharge Planning:  Home with Hospice  Care plan was discussed with patient's 2 daughters, spouse and Dr. Mortimer Fries  Thank you for allowing the Palliative Medicine Team to assist in the care of this patient.   Total time: 72 mins Prolonged services billed: yes Greater than 50%  of this time was spent counseling and coordinating care related to the above assessment and plan.  Mariana Kaufman, AGNP-C Palliative Medicine   Please contact Palliative Medicine Team phone at [phone] for questions and concerns.

## 2020-08-26 NOTE — Progress Notes (Signed)
Dr. Chipper Herb notified of heart rate 130-140's.

## 2020-08-26 NOTE — Progress Notes (Addendum)
Dr. Chipper Herb notified that heart rate continues to be elevated 130's. No new orders.

## 2020-08-26 NOTE — Progress Notes (Signed)
PROGRESS NOTE    Jeffery Smith  ZOX:[id number] DOB: [birth year] DOA: 09/01/20 PCP: System, Provider Not In    Brief Narrative:  Patient is a 86 year old male with past medical history of chronic systolic congestive heart failure, moderate to severe aortic stenosis, who presents to the emergency room with bloody diarrhea.  He was hypotensive, he was giving IV fluids.  Checks x-ray showed diffuse  airspace opacities consistent with multifocal pneumonia. Patient developed hypotension, acute hypoxemic respiratory failure a few hours after arriving the hospital.  He is transferred to ICU for pressors.  He is also on Unasyn for possible aspiration pneumonia. Patient also has significant elevation of BNP, Lasix drip started on 11/17.  Antibiotics changed to Zosyn 11/18.   Assessment & Plan:   Principal Problem:   Severe sepsis with septic shock (HCC) Active Problems:   Chronic kidney disease, stage 3a (HCC)   Rectal bleeding   Acute on chronic combined systolic and diastolic CHF (congestive heart failure) (HCC)   Acute hypoxemic respiratory failure (HCC)   Goals of care, counseling/discussion   Palliative care by specialist   Advanced care planning/counseling discussion   Failure to thrive in adult   Protein-calorie malnutrition, severe   Aspiration pneumonia of both lower lobes due to gastric secretions (HCC)  #1.  Septic shock, aspiration pneumonia. Patient still has intermittent hypotension.  We will give albumin IV 25 g twice a day in addition to IV Lasix. Continue antibiotics, changed to Zosyn as patient hypoxemia is not improving.  #2. Acute hypoxemic respiratory failure secondary to combination of aspirin pneumonia and congestive heart failure. Patient still has a severe hypoxemia.  Oxygenation is not improving significantly.  Continue oxygen treatment.  Patient condition is still critical, may deteriorate quickly.  Discussed with patient and the daughter, patient is still  want to be full code.  #3.  Acute on chronic combined diastolic and systolic congestive heart failure. Moderate to severe aortic stenosis Moderate to severe mitral regurgitation Moderate to severe tricuspid regurgitation. Patient had ejection fraction of 25 to 30%.  Significant elevation of BNP.  He has been treated with IV Lasix drip, renal function gradually improving. Patient developed intermittent hypotension due to diuretics as well as valvular diseases. I will give scheduled albumin IV twice a day in addition to IV Lasix. Given his severe hypoxemia, I will continue IV Lasix drip for another 24 hours.  #4.  Chronic kidney disease stage IIIa. Renal function stable.  5.  Hypotension. Multifactorial, due to poor heart function, valvular heart disease, atrial fibrillation with rapid ventricle response. Continue midodrine, continue hydrocortisone. Patient also receiving scheduled albumin.  6.  Atrial fibrillation with rapid ventricle response. Restart digoxin by cardiology today, will give additional 1 dose of IV digoxin at 0.25 mg.  Helping to bring the blood pressure up.  7.  Prognosis. Patient short-term prognosis is guarded.  Patient has very high risk for deterioration.  We will continue to follow closely in stepdown unit.  8.  Hypokalemia. Supplement.    DVT prophylaxis: Lovenox Code Status: Full Family Communication: Daughter updated. Disposition Plan:  .   Status is: Inpatient  Remains inpatient appropriate because:Inpatient level of care appropriate due to severity of illness   Dispo: The patient is from: Home              Anticipated d/c is to: Home              Anticipated d/c date is: > 3 days  Patient currently is not medically stable to d/c.        I/O last 3 completed shifts: In: 794 [I.V.:273.7; IV Piggyback:520.3] Out: 1425 [Urine:1425] Total I/O In: 401.7 [I.V.:16; IV Piggyback:385.7] Out: 300 [Urine:300]     Consultants:    Cardiology  Procedures: None  Antimicrobials:  Zosyn  Subjective: Patient still has a severe hypoxemia, short of breath with minimal exertion.  He has a cough, nonproductive. He denies any abdominal pain or nausea vomiting. No fever or chills. No dysuria hematuria. No headache or dizziness. No chest pain.  Did not feel palpitation even with rapid heart rate.  Objective: Vitals:   08/26/20 1000 08/26/20 1100 08/26/20 1200 08/26/20 1432  BP: 94/60 (!) 91/55 (!) 88/62 94/64  Pulse: 91 96 (!) 104 (!) 58  Resp: 17 (!) 22 19 (!) 35  Temp:      TempSrc:      SpO2: 94% 93% 92% 91%  Weight:      Height:        Intake/Output Summary (Last 24 hours) at 08/26/2020 1524 Last data filed at 08/26/2020 1400 Gross per 24 hour  Intake 747.72 ml  Output 1525 ml  Net -777.28 ml   Filed Weights   September 10, 2020 1050 08/24/20 0500 08/25/20 0500  Weight: 72.1 kg 69.4 kg 70.1 kg    Examination:  General exam: Appears calm and comfortable  Respiratory system: Decreased breathing sounds. Respiratory effort normal. Cardiovascular system: Irregularly irregular, tachycardiac. No JVD, murmurs, rubs, gallops or clicks. No pedal edema. Gastrointestinal system: Abdomen is nondistended, soft and nontender. No organomegaly or masses felt. Normal bowel sounds heard. Central nervous system: Alert and oriented. No focal neurological deficits. Extremities: Symmetric  Skin: No rashes, lesions or ulcers Psychiatry: Mood & affect appropriate.     Data Reviewed: I have personally reviewed following labs and imaging studies  CBC: Recent Labs  Lab 08/21/20 1706 08/21/20 1706 09-10-20 1115 08/24/20 0537 08/24/20 2224 08/25/20 0659 08/26/20 0545  WBC 12.4*  --  36.3*  36.1* 31.9*  --  19.6* 12.1*  NEUTROABS 10.7*  --  33.0*  --   --   --  11.0*  HGB 13.5   < > 13.8  14.0 12.5* 12.8* 11.9* 10.7*  HCT 41.0   < > 41.7  41.6 37.3* 38.8* 36.3* 32.0*  MCV 95.1  --  92.9  92.2 91.6  --  94.3 94.1   PLT 315  --  372  377 317  --  266 212   < > = values in this interval not displayed.   Basic Metabolic Panel: Recent Labs  Lab 08/21/20 1706 2020-09-10 1115 08/24/20 0537 08/25/20 0659 08/26/20 0545  NA 133* 133* 136 136 139  K 4.3 5.1 4.3 4.0 3.4*  CL 100 97* 102 102 101  CO2 GLUCOSE 118* 114* 123* 142* 145*  BUN 24* 39* 32* 26* 31*  CREATININE 1.34* 1.50* 1.38* 1.27* 1.25*  CALCIUM 9.8 10.0 9.6 9.4 9.1  MG  --   --  2.2  --  2.1  PHOS  --   --   --   --  3.9   GFR: Estimated Creatinine Clearance: 36.6 mL/min (A) (by C-G formula based on SCr of 1.25 mg/dL (H)). Liver Function Tests: Recent Labs  Lab 09-10-20 1115  AST 37  ALT 18  ALKPHOS 81  BILITOT 1.3*  PROT 6.3*  ALBUMIN 2.6*   No results for input(s): LIPASE, AMYLASE in the last 168  hours. No results for input(s): AMMONIA in the last 168 hours. Coagulation Profile: Recent Labs  Lab 2019/12/12 1115  INR 1.1   Cardiac Enzymes: No results for input(s): CKTOTAL, CKMB, CKMBINDEX, TROPONINI in the last 168 hours. BNP (last 3 results) No results for input(s): PROBNP in the last 8760 hours. HbA1C: No results for input(s): HGBA1C in the last 72 hours. CBG: Recent Labs  Lab 2019/12/12 2121  GLUCAP 109*   Lipid Profile: No results for input(s): CHOL, HDL, LDLCALC, TRIG, CHOLHDL, LDLDIRECT in the last 72 hours. Thyroid Function Tests: Recent Labs    2019/12/12 1947  FREET4 1.31*   Anemia Panel: Recent Labs    2019/12/12 1947  VITAMINB12 136*  FOLATE 8.4  FERRITIN 386*  TIBC 146*  IRON 24*  RETICCTPCT 2.2   Sepsis Labs: Recent Labs  Lab 2019/12/12 1121 2019/12/12 1200 08/24/20 0537 08/25/20 0659  PROCALCITON 0.53  --  0.29 0.26  LATICACIDVEN  --  1.6  --   --     Recent Results (from the past 240 hour(s))  Respiratory Panel by RT PCR (Flu A&B, Covid) - Nasopharyngeal Swab     Status: None   Collection Time: 2019/12/12 11:02 AM   Specimen: Nasopharyngeal Swab  Result Value Ref Range  Status   SARS Coronavirus 2 by RT PCR NEGATIVE NEGATIVE Final    Comment: (NOTE) SARS-CoV-2 target nucleic acids are NOT DETECTED.  The SARS-CoV-2 RNA is generally detectable in upper respiratoy specimens during the acute phase of infection. The lowest concentration of SARS-CoV-2 viral copies this assay can detect is 131 copies/mL. A negative result does not preclude SARS-Cov-2 infection and should not be used as the sole basis for treatment or other patient management decisions. A negative result may occur with  improper specimen collection/handling, submission of specimen other than nasopharyngeal swab, presence of viral mutation(s) within the areas targeted by this assay, and inadequate number of viral copies (<131 copies/mL). A negative result must be combined with clinical observations, patient history, and epidemiological information. The expected result is Negative.  Fact Sheet for Patients:  https://www.moore.com/https://www.fda.gov/media/142436/download  Fact Sheet for Healthcare Providers:  https://www.young.biz/https://www.fda.gov/media/142435/download  This test is no t yet approved or cleared by the [place] FDA and  has been authorized for detection and/or diagnosis of SARS-CoV-2 by FDA under an Emergency Use Authorization (EUA). This EUA will remain  in effect (meaning this test can be used) for the duration of the COVID-19 declaration under Section 564(b)(1) of the Act, 21 U.S.C. section 360bbb-3(b)(1), unless the authorization is terminated or revoked sooner.     Influenza A by PCR NEGATIVE NEGATIVE Final   Influenza B by PCR NEGATIVE NEGATIVE Final    Comment: (NOTE) The Xpert Xpress SARS-CoV-2/FLU/RSV assay is intended as an aid in  the diagnosis of influenza from Nasopharyngeal swab specimens and  should not be used as a sole basis for treatment. Nasal washings and  aspirates are unacceptable for Xpert Xpress SARS-CoV-2/FLU/RSV  testing.  Fact Sheet for  Patients: https://www.moore.com/https://www.fda.gov/media/142436/download  Fact Sheet for Healthcare Providers: https://www.young.biz/https://www.fda.gov/media/142435/download  This test is not yet approved or cleared by the [place] FDA and  has been authorized for detection and/or diagnosis of SARS-CoV-2 by  FDA under an Emergency Use Authorization (EUA). This EUA will remain  in effect (meaning this test can be used) for the duration of the  Covid-19 declaration under Section 564(b)(1) of the Act, 21  U.S.C. section 360bbb-3(b)(1), unless the authorization is  terminated or revoked. Performed at [facility]  Lab, [address] [place], [place] [address]   Blood culture (routine x 2)     Status: None (Preliminary result)   Collection Time: 08/22/2020 12:06 PM   Specimen: BLOOD  Result Value Ref Range Status   Specimen Description BLOOD RIGHT FA  Final   Special Requests   Final    BOTTLES DRAWN AEROBIC AND ANAEROBIC Blood Culture adequate volume   Culture   Final    NO GROWTH 3 DAYS Performed at [facility], [address], [place], [place] [address]    Report Status PENDING  Incomplete  Blood culture (routine x 2)     Status: None (Preliminary result)   Collection Time: 08/09/2020 12:06 PM   Specimen: BLOOD  Result Value Ref Range Status   Specimen Description BLOOD LEFT [facility]  Final   Special Requests   Final    BOTTLES DRAWN AEROBIC AND ANAEROBIC Blood Culture adequate volume   Culture   Final    NO GROWTH 3 DAYS Performed at [facility], [address], [place], [place] [address]    Report Status PENDING  Incomplete  MRSA PCR Screening     Status: None   Collection Time: 08/26/2020  9:50 PM   Specimen: Nasal Mucosa; Nasopharyngeal  Result Value Ref Range Status   MRSA by PCR NEGATIVE NEGATIVE Final    Comment:        The GeneXpert MRSA Assay (FDA approved for NASAL specimens only), is one component of a comprehensive MRSA colonization surveillance program. It is not intended to  diagnose MRSA infection nor to guide or monitor treatment for MRSA infections. Performed at [facility], [address], [place], [place] [address]          Radiology Studies: ECHOCARDIOGRAM COMPLETE  Result Date: 08/24/2020    ECHOCARDIOGRAM REPORT   Patient Name:   ALEKS NAWROT Date of Exam: 08/24/2020 Medical Rec #:  [id number]            Height:       73.0 in Accession #:    [id number]           Weight:       153.0 lb Date of Birth:  [birth year]            BSA:          1.920 m Patient Age:    89 years             BP:           84/61 mmHg Patient Gender: M                    HR:           103 bpm. Exam Location:  [facility] Procedure: 2D Echo, Color Doppler and Cardiac Doppler Indications:     Septic shock  History:         Patient has prior history of Echocardiogram examinations, most                  recent 06/28/2020. Risk Factors:Dyslipidemia.  Sonographer:     Humphrey Rolls RDCS (AE) Referring Phys:  [id number] Caryn Bee PACKER Diagnosing Phys: Adrian Blackwater MD  Sonographer Comments: Suboptimal parasternal window. IMPRESSIONS  1. Left ventricular ejection fraction, by estimation, is 25 to 30%. The left ventricle has severely decreased function. The left ventricle demonstrates global hypokinesis. The left ventricular internal cavity size was severely dilated. There is mild concentric left ventricular hypertrophy.  Left ventricular diastolic parameters are consistent with Grade III diastolic dysfunction (restrictive).  2. Right ventricular systolic function is mildly reduced. The right ventricular size is moderately enlarged. Mildly increased right ventricular wall thickness.  3. Left atrial size was moderately dilated.  4. Right atrial size was moderately dilated.  5. The mitral valve is grossly normal. Moderate to severe mitral valve regurgitation.  6. The tricuspid valve is abnormal. Tricuspid valve regurgitation is moderate to severe.  7. The aortic valve is abnormal. Aortic valve  regurgitation is trivial. Mild to moderate aortic valve stenosis. Conclusion(s)/Recommendation(s): Findings consistent with dilated cardiomyopathy. FINDINGS  Left Ventricle: Left ventricular ejection fraction, by estimation, is 25 to 30%. The left ventricle has severely decreased function. The left ventricle demonstrates global hypokinesis. The left ventricular internal cavity size was severely dilated. There is mild concentric left ventricular hypertrophy. Left ventricular diastolic parameters are consistent with Grade III diastolic dysfunction (restrictive). Right Ventricle: The right ventricular size is moderately enlarged. Mildly increased right ventricular wall thickness. Right ventricular systolic function is mildly reduced. Left Atrium: Left atrial size was moderately dilated. Right Atrium: Right atrial size was moderately dilated. Pericardium: There is no evidence of pericardial effusion. Mitral Valve: The mitral valve is grossly normal. Moderate to severe mitral valve regurgitation. MV peak gradient, 6.5 mmHg. The mean mitral valve gradient is 3.0 mmHg. Tricuspid Valve: The tricuspid valve is abnormal. Tricuspid valve regurgitation is moderate to severe. Aortic Valve: The aortic valve is abnormal. Aortic valve regurgitation is trivial. Mild to moderate aortic stenosis is present. Aortic valve mean gradient measures 18.0 mmHg. Aortic valve peak gradient measures 36.0 mmHg. Aortic valve area, by VTI measures 1.20 cm. Pulmonic Valve: The pulmonic valve was grossly normal. Pulmonic valve regurgitation is mild. Aorta: The aortic root was not well visualized. IAS/Shunts: No atrial level shunt detected by color flow Doppler.  LEFT VENTRICLE PLAX 2D LVIDd:         4.88 cm  Diastology LVIDs:         4.25 cm  LV e' medial:    5.22 cm/s LV PW:         1.21 cm  LV E/e' medial:  23.1 LV IVS:        0.95 cm  LV e' lateral:   9.46 cm/s LVOT diam:     2.50 cm  LV E/e' lateral: 12.7 LV SV:         62 LV SV Index:   32 LVOT  Area:     4.91 cm  RIGHT VENTRICLE RV Basal diam:  3.57 cm LEFT ATRIUM             Index       RIGHT ATRIUM           Index LA diam:        3.70 cm 1.93 cm/m  RA Area:     15.80 cm LA Vol (A2C):   54.2 ml 28.23 ml/m RA Volume:   43.30 ml  22.55 ml/m LA Vol (A4C):   37.6 ml 19.58 ml/m LA Biplane Vol: 46.3 ml 24.11 ml/m  AORTIC VALVE                    PULMONIC VALVE AV Area (Vmax):    1.10 cm     PV Vmax:       0.63 m/s AV Area (Vmean):   1.16 cm     PV Vmean:      40.800 cm/s AV Area (VTI):  1.20 cm     PV VTI:        0.092 m AV Vmax:           300.00 cm/s  PV Peak grad:  1.6 mmHg AV Vmean:          194.000 cm/s PV Mean grad:  1.0 mmHg AV VTI:            0.517 m AV Peak Grad:      36.0 mmHg AV Mean Grad:      18.0 mmHg LVOT Vmax:         67.20 cm/s LVOT Vmean:        45.900 cm/s LVOT VTI:          0.126 m LVOT/AV VTI ratio: 0.24  AORTA Ao Root diam: 3.30 cm MITRAL VALVE                TRICUSPID VALVE MV Area (PHT): 7.25 cm     TR Peak grad:   49.0 mmHg MV Peak grad:  6.5 mmHg     TR Vmax:        350.00 cm/s MV Mean grad:  3.0 mmHg MV Vmax:       1.27 m/s     SHUNTS MV Vmean:      86.2 cm/s    Systemic VTI:  0.13 m MV Decel Time: 105 msec     Systemic Diam: 2.50 cm MV E velocity: 120.33 cm/s MV A velocity: 95.30 cm/s MV E/A ratio:  1.26 Adrian Blackwater MD Electronically signed by Adrian Blackwater MD Signature Date/Time: 08/24/2020/6:57:23 PM    Final         Scheduled Meds: . Chlorhexidine Gluconate Cloth  6 each Topical Daily  . digoxin  0.25 mg Intravenous Once  . digoxin  0.25 mg Oral Daily  . enoxaparin (LOVENOX) injection  40 mg Subcutaneous Q24H  . feeding supplement  237 mL Oral TID BM  . fluticasone  2 spray Each Nare Daily  . hydrocortisone sod succinate (SOLU-CORTEF) inj  50 mg Intravenous Q6H  . ipratropium  2 spray Each Nare QHS  . midodrine  10 mg Oral TID WC  . multivitamin with minerals  1 tablet Oral Daily  . pantoprazole  40 mg Oral BID  . polyethylene glycol  17 g Oral  Daily  . sodium chloride flush  3 mL Intravenous Q12H  . vitamin B-12  1,000 mcg Oral Daily   Continuous Infusions: . sodium chloride    . sodium chloride    . albumin human    . furosemide (LASIX) 200 mg in dextrose 5% 100 mL (2mg /mL) infusion 4 mg/hr (08/26/20 1400)  . norepinephrine (LEVOPHED) Adult infusion Stopped (08/25/20 0933)     LOS: 3 days    Time spent: 35 minutes    08/27/20, MD [facility]   To contact the attending provider between 7A-7P or the covering provider during after hours 7P-7A, please log into the web site [email or web address] and access using universal [place] password for that web site. If you do not have the password, please call the hospital operator.  08/26/2020, 3:24 PM

## 2020-08-27 ENCOUNTER — Inpatient Hospital Stay: Payer: Medicare Other

## 2020-08-27 DIAGNOSIS — Z7189 Other specified counseling: Secondary | ICD-10-CM | POA: Diagnosis not present

## 2020-08-27 DIAGNOSIS — J9601 Acute respiratory failure with hypoxia: Secondary | ICD-10-CM | POA: Diagnosis not present

## 2020-08-27 DIAGNOSIS — J690 Pneumonitis due to inhalation of food and vomit: Secondary | ICD-10-CM

## 2020-08-27 DIAGNOSIS — I5043 Acute on chronic combined systolic (congestive) and diastolic (congestive) heart failure: Secondary | ICD-10-CM | POA: Diagnosis not present

## 2020-08-27 LAB — CBC WITH DIFFERENTIAL/PLATELET
Abs Immature Granulocytes: 0.15 10*3/uL — ABNORMAL HIGH (ref 0.00–0.07)
Basophils Absolute: 0 10*3/uL (ref 0.0–0.1)
Basophils Relative: 0 %
Eosinophils Absolute: 0 10*3/uL (ref 0.0–0.5)
Eosinophils Relative: 0 %
HCT: 31.8 % — ABNORMAL LOW (ref 39.0–52.0)
Hemoglobin: 10.4 g/dL — ABNORMAL LOW (ref 13.0–17.0)
Immature Granulocytes: 1 %
Lymphocytes Relative: 4 %
Lymphs Abs: 0.6 10*3/uL — ABNORMAL LOW (ref 0.7–4.0)
MCH: 30.6 pg (ref 26.0–34.0)
MCHC: 32.7 g/dL (ref 30.0–36.0)
MCV: 93.5 fL (ref 80.0–100.0)
Monocytes Absolute: 0.7 10*3/uL (ref 0.1–1.0)
Monocytes Relative: 5 %
Neutro Abs: 12.3 10*3/uL — ABNORMAL HIGH (ref 1.7–7.7)
Neutrophils Relative %: 90 %
Platelets: 219 10*3/uL (ref 150–400)
RBC: 3.4 MIL/uL — ABNORMAL LOW (ref 4.22–5.81)
RDW: 13.5 % (ref 11.5–15.5)
WBC: 13.7 10*3/uL — ABNORMAL HIGH (ref 4.0–10.5)
nRBC: 0 % (ref 0.0–0.2)

## 2020-08-27 LAB — PHOSPHORUS: Phosphorus: 3.7 mg/dL (ref 2.5–4.6)

## 2020-08-27 LAB — MAGNESIUM: Magnesium: 2.2 mg/dL (ref 1.7–2.4)

## 2020-08-27 LAB — BRAIN NATRIURETIC PEPTIDE: B Natriuretic Peptide: 4300.2 pg/mL — ABNORMAL HIGH (ref 0.0–100.0)

## 2020-08-27 LAB — PROCALCITONIN: Procalcitonin: 0.16 ng/mL

## 2020-08-27 LAB — HEPARIN LEVEL (UNFRACTIONATED): Heparin Unfractionated: 0.28 IU/mL — ABNORMAL LOW (ref 0.30–0.70)

## 2020-08-27 MED ORDER — HEPARIN BOLUS VIA INFUSION
1050.0000 [IU] | Freq: Once | INTRAVENOUS | Status: AC
  Administered 2020-08-27: 1050 [IU] via INTRAVENOUS
  Filled 2020-08-27: qty 1050

## 2020-08-27 MED ORDER — HEPARIN BOLUS VIA INFUSION
4200.0000 [IU] | Freq: Once | INTRAVENOUS | Status: AC
  Administered 2020-08-27: 4200 [IU] via INTRAVENOUS
  Filled 2020-08-27: qty 4200

## 2020-08-27 MED ORDER — HEPARIN (PORCINE) 25000 UT/250ML-% IV SOLN
1250.0000 [IU]/h | INTRAVENOUS | Status: DC
  Administered 2020-08-27: 1000 [IU]/h via INTRAVENOUS
  Administered 2020-08-28 – 2020-08-29 (×2): 1250 [IU]/h via INTRAVENOUS
  Filled 2020-08-27 (×3): qty 250

## 2020-08-27 MED ORDER — METOPROLOL SUCCINATE ER 25 MG PO TB24
12.5000 mg | ORAL_TABLET | Freq: Every day | ORAL | Status: DC
  Administered 2020-08-28 – 2020-08-29 (×2): 12.5 mg via ORAL
  Filled 2020-08-27 (×3): qty 0.5

## 2020-08-27 NOTE — Progress Notes (Signed)
PT Cancellation Note  Patient Details Name: Jeffery Smith MRN: [id number] DOB: 09-30-1925   Cancelled Treatment:    Reason Eval/Treat Not Completed: Patient not medically ready.  Chart reviewed.  Pt's BP noted to be 92/60 resting in bed this morning and now on 40 L/min HFNC (was on 10 L/min HFNC yesterday).  Pt also started on amiodarone drip last night d/t cardiac concerns yesterday.  Also noted pt transferred to ICU status yesterday.  Discussed pt's medical status with pt's nurse.  D/t pt transferring to higher level of care (and change in medical status), per PT protocol require new PT consult in order to continue therapy (will discontinue current PT order d/t this).  Please re-consult PT when pt is medically appropriate to participate in PT.  Plan discussed with pt's nurse.    Hendricks Limes, PT 08/27/20, 9:14 AM

## 2020-08-27 NOTE — Progress Notes (Signed)
SUBJECTIVE: Patient back to ICU status. Denies chest pain. Denies shortness of breath but patient does appear to have mildly labored breathing and has had increased O2 requirements overnight.   Vitals:   08/27/20 0645 08/27/20 0700 08/27/20 0800 08/27/20 0852  BP:  102/73 (!) 93/57   Pulse: 92 96 69 87  Resp: 17 (!) 27 (!) 24 18  Temp:    97.6 F (36.4 C)  TempSrc:    Axillary  SpO2: 99% 96% 97% 98%  Weight:      Height:        Intake/Output Summary (Last 24 hours) at 08/27/2020 0908 Last data filed at 08/27/2020 0400 Gross per 24 hour  Intake 899.62 ml  Output 325 ml  Net 574.62 ml    LABS: Basic Metabolic Panel: Recent Labs    08/25/20 0659 08/26/20 0545 08/27/20 0416  NA 136 139  --   K 4.0 3.4*  --   CL 102 101  --   CO2 23 27  --   GLUCOSE 142* 145*  --   BUN 26* 31*  --   CREATININE 1.27* 1.25*  --   CALCIUM 9.4 9.1  --   MG  --  2.1 2.2  PHOS  --  3.9 3.7   Liver Function Tests: No results for input(s): AST, ALT, ALKPHOS, BILITOT, PROT, ALBUMIN in the last 72 hours. No results for input(s): LIPASE, AMYLASE in the last 72 hours. CBC: Recent Labs    08/26/20 0545 08/27/20 0416  WBC 12.1* 13.7*  NEUTROABS 11.0* 12.3*  HGB 10.7* 10.4*  HCT 32.0* 31.8*  MCV 94.1 93.5  PLT 212 219   Cardiac Enzymes: No results for input(s): CKTOTAL, CKMB, CKMBINDEX, TROPONINI in the last 72 hours. BNP: Invalid input(s): POCBNP D-Dimer: No results for input(s): DDIMER in the last 72 hours. Hemoglobin A1C: No results for input(s): HGBA1C in the last 72 hours. Fasting Lipid Panel: No results for input(s): CHOL, HDL, LDLCALC, TRIG, CHOLHDL, LDLDIRECT in the last 72 hours. Thyroid Function Tests: No results for input(s): TSH, T4TOTAL, T3FREE, THYROIDAB in the last 72 hours.  Invalid input(s): FREET3 Anemia Panel: No results for input(s): VITAMINB12, FOLATE, FERRITIN, TIBC, IRON, RETICCTPCT in the last 72 hours.   PHYSICAL EXAM General: Well developed, well  nourished, in no acute distress HEENT:  Normocephalic and atramatic Neck:  No JVD.  Lungs: Clear bilaterally to auscultation and percussion. Heart: HRRR . Normal S1 and S2 without gallops or murmurs.  Abdomen: Bowel sounds are positive, abdomen soft and non-tender  Msk:  Back normal, normal gait. Normal strength and tone for age. Extremities: No clubbing, cyanosis or edema.   Neuro: Alert and oriented X 3. Psych:  Good affect, responds appropriately  TELEMETRY: A fib. 104/bpm  ASSESSMENT AND PLAN: Patient presenting to the emergency department with progressive generalized weakness found to have pneumonia sepsis.Patient converted into A fib with RVR last night with rates in the 130-140s and started on an Amio infusion. Remains in atrial fibrillation currently and recommend continuing Amio infusion as well as digoxin 0.250mg  daily. Also placed pharmacy heparin consult as patient remains in A fib and has no evidence of rectal bleeding as was one his his initial complaints on admission. With BP stable will initiate low dose BB. If stable this afternoon, consider resuming diuretics, but low dose. Wife at bedside, all questions answered.We will continue to follow.  Principal Problem:   Severe sepsis with septic shock (HCC) Active Problems:   Chronic kidney disease, stage 3a (  HCC)   Rectal bleeding   Acute on chronic combined systolic and diastolic CHF (congestive heart failure) (HCC)   Acute hypoxemic respiratory failure (HCC)   Goals of care, counseling/discussion   Palliative care by specialist   Advanced care planning/counseling discussion   Failure to thrive in adult   Protein-calorie malnutrition, severe   Aspiration pneumonia of both lower lobes due to gastric secretions (HCC)   Hyponatremia    Maryelizabeth Kaufmann, NP-C 08/27/2020 9:08 AM

## 2020-08-27 NOTE — Progress Notes (Signed)
Daily Progress Note   Patient Name: Jeffery Smith       Date: 08/27/2020 DOB: [birth year]  Age: 86 y.o. MRN#: [id number] Attending Physician: Flora Lipps, MD Primary Care Physician: System, Provider Not In Admit Date: 08/25/2020  Reason for Consultation/Follow-up: Establishing goals of care  Subjective: Events of last night noted.  Patient currently on amiodarone infusion and started on heparin drip.  He is awake and alert, but appears very tired.  His daughter brought him a slice of pumpkin pie that he greatly enjoyed.  Requiring heated high flow oxygen at 45 L.  I met with his daughter and spouse and included his other daughter on the phone.  I reviewed with them the events of last night and his current status.  They have expressed that their overall goal is to try and get him where he would survive a ride home so that he could be at home with hospice and die at home comfortably.  We discussed the primary barriers to that right now are his high oxygen requirements and the IV infusion of amiodarone.  We again discussed CODE STATUS.  Family does agree that a full code would not be beneficial for patient.  Given that he at this point does not appear to have capacity to make this decision as he is having some delirium, as well as hard of hearing which makes goals of care conversation with him difficult, family is Ambulance person. We discussed that a change to DNR does not mean decreasing or not providing any support to keep patient from experiencing cardiac arrest, rather it means not doing a procedure to him that would not benefit him in any way. Pearlean Brownie and Santiago Glad all agree that DNR would be appropriate in his situation, however they wish to discuss with her brother [name].  The  plan to call back to nursing tonight with decision regarding CODE STATUS. They do request that if he is made DNR that the purple bracelet is not placed, as they worry he would not understand exactly what a DNR means and would believe it to mean that attempts would not be continued to keep him alive until he can discharge home.   Review of Systems  Unable to perform ROS: Other    Length of Stay: 4  Current Medications: Scheduled Meds:  .  amiodarone  150 mg Intravenous Once  . Chlorhexidine Gluconate Cloth  6 each Topical Daily  . digoxin  0.25 mg Oral Daily  . feeding supplement  237 mL Oral TID BM  . fluticasone  2 spray Each Nare Daily  . hydrocortisone sod succinate (SOLU-CORTEF) inj  50 mg Intravenous Q6H  . ipratropium  2 spray Each Nare QHS  . metoprolol succinate  12.5 mg Oral Daily  . midodrine  10 mg Oral TID WC  . multivitamin with minerals  1 tablet Oral Daily  . pantoprazole  40 mg Oral BID  . polyethylene glycol  17 g Oral Daily  . sodium chloride flush  3 mL Intravenous Q12H  . vitamin B-12  1,000 mcg Oral Daily    Continuous Infusions: . sodium chloride    . sodium chloride    . albumin human 25 g (08/27/20 1137)  . amiodarone 30 mg/hr (08/27/20 1138)  . heparin 1,000 Units/hr (08/27/20 1338)  . phenylephrine (NEO-SYNEPHRINE) Adult infusion    . piperacillin-tazobactam (ZOSYN)  IV Stopped (08/27/20 0325)    PRN Meds: sodium chloride, acetaminophen, haloperidol lactate, ipratropium-albuterol, sodium chloride flush, traZODone  Physical Exam Vitals and nursing note reviewed.  Constitutional:      General: He is not in acute distress.    Appearance: He is ill-appearing.  Pulmonary:     Effort: Pulmonary effort is normal.  Neurological:     Mental Status: He is alert.     Comments: Very hard of hearing             Vital Signs: BP (!) 87/69   Pulse 93   Temp 97.6 F (36.4 C) (Axillary)   Resp 18   Ht 6' 1"  (1.854 m)   Wt 70.1 kg   SpO2 94%   BMI  20.39 kg/m  SpO2: SpO2: 94 % O2 Device: O2 Device: High Flow Nasal Cannula O2 Flow Rate: O2 Flow Rate (L/min): 40 L/min  Intake/output summary:   Intake/Output Summary (Last 24 hours) at 08/27/2020 1522 Last data filed at 08/27/2020 1046 Gross per 24 hour  Intake 497.89 ml  Output 625 ml  Net -127.11 ml   LBM: Last BM Date: 08/26/20 Baseline Weight: Weight: 72.1 kg Most recent weight: Weight: 70.1 kg       Palliative Assessment/Data: PPS: 30%      Patient Active Problem List   Diagnosis Date Noted  . Hyponatremia 08/26/2020  . Protein-calorie malnutrition, severe 08/25/2020  . Aspiration pneumonia of both lower lobes due to gastric secretions ([place]) 08/25/2020  . Goals of care, counseling/discussion   . Palliative care by specialist   . Advanced care planning/counseling discussion   . Failure to thrive in adult   . Generalized weakness 09/06/2020  . Rectal bleeding 08/10/2020  . Leukemoid reaction 08/27/2020  . Acute on chronic combined systolic and diastolic CHF (congestive heart failure) ([place]) 08/27/2020  . Acute hypoxemic respiratory failure ([place]) 09/04/2020  . Severe sepsis with septic shock ([place]) 08/28/2020  . Acute exacerbation of CHF (congestive heart failure) ([place]) 07/12/2020  . Hyperlipidemia   . Hard of hearing   . GERD (gastroesophageal reflux disease)   . Elevated brain natriuretic peptide (BNP) level   . Anemia due to chronic kidney disease   . Acute CHF (congestive heart failure) ([place]) 06/28/2020  . Acute respiratory failure with hypoxia ([place]) 06/28/2020  . Acute on chronic diastolic CHF (congestive heart failure) ([place]) 06/28/2020  . Moderate to severe aortic stenosis 06/28/2020  . H/O carotid  endarterectomy 06/28/2020  . Elevated troponin 06/28/2020  . Chronic kidney disease, stage 3a ([place]) 06/28/2020  . OA (osteoarthritis) of knee 09/21/2014  . Occlusion and stenosis of carotid artery without mention of cerebral infarction 11/21/2011     Palliative Care Assessment & Plan   Patient Profile: 86 y.o. male  with past medical history of CHF EF 35-40%, Lifevest, mod to severe AS, GERD, BPH, HLD, very HOH admitted on 08/12/2020 with progressing weakness, poor po intake, rectal bleeding.  CT scan worrisome for rectal thickening, recommending sigmoidoscopy, GI has been consulted.  He had recent ED visit for fecal impaction. Workup reveals septic shock due to pneumonia. He is requiring vasopressors for BP support. SLP eval did not show signs of aspiration. Palliative medicine consulted for [place].    Assessment/Recommendations/Plan   Continue current scope care  Family to call with final DNR decision- discussed with nursing to not place purple bracelet  [place] continue to be to get patient where he can survive transfer home- this would mean decreasing oxygen needs and coming off IV amiodorone safely  Family also requesting assistance in finding care to supplement Hospice- they understand would be private pay- I have sent secure chat message to [facility]  Goals of Care and Additional Recommendations:  Limitations on Scope of Treatment: Full Scope Treatment  Code Status:  Full code  Prognosis:   Unable to determine  Discharge Planning:  To Be Determined  Care plan was discussed with patient's family and care team.   Thank you for allowing the Palliative Medicine Team to assist in the care of this patient.   Total time:  68 mins Greater than 50%  of this time was spent counseling and coordinating care related to the above assessment and plan.  Mariana Kaufman, AGNP-C Palliative Medicine   Please contact Palliative Medicine Team phone at [phone] for questions and concerns.

## 2020-08-27 NOTE — Plan of Care (Signed)
  Problem: Coping: Goal: Level of anxiety will decrease Outcome: Progressing   Problem: Elimination: Goal: Will not experience complications related to bowel motility Outcome: Progressing Goal: Will not experience complications related to urinary retention Outcome: Progressing   Problem: Pain Managment: Goal: General experience of comfort will improve Outcome: Progressing   Problem: Skin Integrity: Goal: Risk for impaired skin integrity will decrease Outcome: Progressing   

## 2020-08-27 NOTE — TOC Progression Note (Signed)
Transition of Care [facility]) - Progression Note    Patient Details  Name: Jeffery Smith MRN: [id number] Date of Birth: 05/23/1925  Transition of Care [facility]) CM/SW Contact  Marina Goodell Phone Number: [phone] 08/27/2020, 10:39 AM  Clinical Narrative:     CSW spoke with patient's daughter [name] [phone], for update on patient disposition.  [name] stated the patient and his spouse Jeffery Smith, spoke with palliative care RN and they decided to go home with hospice.  CSW asked if they would want me to reach out to [facility] and Ms. Baker stated yes. CSW stated Clydie Braun RN from [place] would reach out to her.  [name] verbalized understanding. CSW reached out to Evangeline Gula at [employer] and updated her.  Evangeline Gula stated she would reach out to the palliative care RN and to the family.  Expected Discharge Plan: Home w Hospice Care Barriers to Discharge: Continued Medical Work up  Expected Discharge Plan and Services Expected Discharge Plan: Home w Hospice Care                                               Social Determinants of Health (SDOH) Interventions    Readmission Risk Interventions No flowsheet data found.

## 2020-08-27 NOTE — Progress Notes (Signed)
NAME:  Jeffery Smith, MRN:  [id number], DOB:  08/15/1925, LOS: 4 ADMISSION DATE:  09-05-2020, CONSULTATION DATE:  09-05-20 REFERRING MD:  TRH, CHIEF COMPLAINT:  Septic shock  Brief History   86 year old presenting from home with rectal bleeding and diarrhea after recent fecal disimpaction with progressive generalized weakness and poor PO intake.  Hypotensive with mild AKI with CXR consistent with multifocal pneumonia.  Hgb stable.  Initially admitted to [facility], however had worsening hypotension requiring vasopressor support and hypoxia now on NRB, PCCM consulted for further ICU management.   History of present illness   HPI obtained from patient and patient's wife, Kathie Rhodes, and their daughter Jamesetta So at bedside, as patient is poor historian.   86 year old male with prior history of systolic and diastolic HF with recent EF 35-40% on 06/28/2020 with LifeVest, moderate to severe AS, GERD, BPH, HLD, and HOH presenting from home with two day history of progressive generalized weakness, poor PO intake/ appetite, and rectal bleeding with diarrhea.    He was evaluated on 11/13 in [facility] ER for constipation, found to have a fecal impaction and therefore manually disimpacted and given enema.  Wife reports she last gave him a laxative yesterday.  Reported bright red blood with diarrhea at home but blood only on his depends, and not mixed in stool per wife.  Additionally over the last month, he has received a steroid injection for his right hip 11/2, sustained a fall at home, and he was taken off his carvedilol and losartan by cardiology for ongoing hypotension.  Wife reports he was treated for pneumonia 1-2 weeks ago (? 10/29) with doxycyline.  At that time, he did not have fever but had a productive cough, since resolved.  Denies any recent fever or chills other than patient always being cold, no vomiting, focal weakness, LOC, chest pain, leg swelling, abdominal pain, dysuria, or vomiting.  Does report some  clear sputum which is pink-tinged at times.  Patient reported some SOB earlier today, since resolved.  No reports of obvious dysphagia or coughing episodes after eating but wife noted the other day, he held some [place] bacon in his mouth and kept chewing like he could not remember to swallow.    In ER, patient afebrile with initial BP 85/66, tachypnea and 90% on room air.  Labs noted for WBC 36.3, Hgb stable 13.8, Na 133, K 5.1, CL 97, glucose 114, BUN 39, sCr 1.5 (baseline  ~1.2 - 1.4), low albumin and protein, normal coags, SARS2/ flu negative, CXR consistent with multifocal pneumonia, CT abdomen/ pelvis showed  mass-like mural thickening of the distal rectum with associated mucosal hyperenhancement; suggest sigmoidoscopy to exclude possibility of infiltrative neoplasm.  Additionally noted for bilateral lower lobe consolidation, right greater than left, with small pleural effusion.  Blood cultures sent and given vancomycin, zosyn, and cefepime in ER.  He was given a 1L NS bolus with some improvement in hypotension, however ended up on peripheral levophed.  He was placed on nasal cannula however had acute increase in oxygen requirements to NRB.  Wife reports he ate on a boxed lunch in the ER and then got sicker.   He was admitted to [facility] however given vasopressor and oxygen requirements, PCCM consulted for further ICU management.   Past Medical History  Systolic and diastolic HF with EF 35-40% (06/28/2020) with LifeVest, moderate to severe AS, GERD, BPH, HLD, [facility]  Significant Hospital Events   11/15 Admitted   Consults:  Cardiology GI  Procedures:   Significant Diagnostic Tests:  11/15 CT a/p >> 1. Mass-like mural thickening of the distal rectum with some associated mucosal hyperenhancement. Correlation with sigmoidoscopy is suggested to exclude the possibility of infiltrative neoplasm. 2. Areas of airspace consolidation in the lower lobes of the lungs bilaterally (right greater than left),  concerning for multilobar pneumonia. 3. Small right parapneumonic pleural effusion. 4. Cholelithiasis or biliary sludge lying dependently in the gallbladder, without evidence of acute cholecystitis at this time. 5. Colonic diverticulosis. 6. 2 mm nonobstructive calculus in the lower pole collecting system of the left kidney. 7. Aortic atherosclerosis. 8. Additional incidental findings, as above.  Micro Data:  11/15 SARS 2/ Flu >> neg 11/15 BCx2 >> 11/15 MRSA >>   Antimicrobials:  11/15 vanc 11/15 zosyn  11/15 cefepime 11/15 azithro >> 11/16 unasyn >>    CC Follow up shock  HPI More alert awake Pressors dosage improving Speech path assessment pending  Objective   Blood pressure 100/64, pulse 92, temperature 98 F (36.7 C), resp. rate 17, height 6\' 1"  (1.854 m), weight 70.1 kg, SpO2 99 %.    FiO2 (%):  [90 %-100 %] 90 %   Intake/Output Summary (Last 24 hours) at 08/27/2020 0816 Last data filed at 08/27/2020 0400 Gross per 24 hour  Intake 899.62 ml  Output 325 ml  Net 574.62 ml   Filed Weights   08/15/2020 1050 08/24/20 0500 08/25/20 0500  Weight: 72.1 kg 69.4 kg 70.1 kg    Review of Systems:  Gen:  Denies  fever, sweats, chills weight loss  Other:  All other systems negative    Physical Examination:   General Appearance: No distress  Neuro:without focal findings,  speech normal,  HEENT: PERRLA, EOM intact.   Pulmonary: normal breath sounds, No wheezing.  CardiovascularNormal S1,S2.  No m/r/g.   Abdomen: Benign, Soft, non-tender. PSYCHIATRIC: Mood, affect within normal limits.   ALL OTHER ROS ARE NEGATIVE    Assessment & Plan:   Septic shock secondary to pneumonia Wean off pressors Sepsis improving Continue IV abx Start midodrine Oxygen as needed    -possible PCP pneumonia -poor prognosis  Combined systolic and diastolic HF with EF 35-40% on 08/27/20 with LifeVest Moderate to severe AS Very poor prognosis  Failure to thrive/ protein calorie  malnutrition, BMI 20.98 - on chart review-> weight 77.1 on 07/12/2020 to now 72.1    Recommend Palliative care consultation to address goals of care    Critical care provider statement:    Critical care time (minutes):  33   Critical care time was exclusive of:  Separately billable procedures and  treating other patients   Critical care was necessary to treat or prevent imminent or  life-threatening deterioration of the following conditions:  acute systolic CHF exacerbation , severe hypoxemia acutely, multiple comoribid conditions.    Critical care was time spent personally by me on the following  activities:  Development of treatment plan with patient or surrogate,  discussions with consultants, evaluation of patient's response to  treatment, examination of patient, obtaining history from patient or  surrogate, ordering and performing treatments and interventions, ordering  and review of laboratory studies and re-evaluation of patient's condition   I assumed direction of critical care for this patient from another  provider in my specialty: no     09/11/2020, M.D.  Pulmonary & Critical Care Medicine  [facility]

## 2020-08-27 NOTE — Progress Notes (Signed)
[facility] Liaison note:  New referral for [employer] hospice services at home received from [facility] [place].  Will see patient and contact family pending improvement in patients and when he gets closer to discharge. Palliative NP Ocie Bob aware.  Dayna Barker BSN, RN, [facility] [employer] [phone]

## 2020-08-27 NOTE — Progress Notes (Addendum)
ANTICOAGULATION CONSULT NOTE  Pharmacy Consult for heparin Indication: atrial fibrillation  Allergies  Allergen Reactions  . Horse-Derived Products Anaphylaxis    Patient Measurements: Height: 6\' 1"  (185.4 cm) Weight: 70.1 kg (154 lb 8.7 oz) IBW/kg (Calculated) : 79.9 Heparin Dosing Weight: 70 kg  Vital Signs: Temp: 97.6 F (36.4 C) (11/19 0852) Temp Source: Axillary (11/19 0852) BP: 93/57 (11/19 0800) Pulse Rate: 87 (11/19 0852)  Labs: Recent Labs    08/25/20 0659 08/25/20 0659 08/26/20 0545 08/27/20 0416  HGB 11.9*   < > 10.7* 10.4*  HCT 36.3*  --  32.0* 31.8*  PLT 266  --  212 219  CREATININE 1.27*  --  1.25*  --    < > = values in this interval not displayed.    Estimated Creatinine Clearance: 36.6 mL/min (A) (by C-G formula based on SCr of 1.25 mg/dL (H)).   Medical History: Past Medical History:  Diagnosis Date  . Arthritis   . Benign head tremor   . Carotid artery occlusion    right side   . Dysrhythmia    irregular heart beat per patient- skips a beat   . Enlarged prostate   . GERD (gastroesophageal reflux disease)   . Hard of hearing   . Hyperlipidemia   . Sore throat      Assessment: 86 year old male who presented with concern for bloody diarrhea, hypotensive on admission. Patient is being treated for respiratory failure s/t pneumonia and HF. Patient started on furosemide drip to help with diuresis. Patient with afib with RVR, Cardiology started on digoxin. Continued to have issues with afib and was started on amiodarone drip. Cardiology consult for heparin drip with persistent afib. No further concerns for GIB.  Goal of Therapy:  Heparin level 0.3-0.7 units/ml Monitor platelets by anticoagulation protocol: Yes   Plan:  Heparin 4200 unit bolus followed by heparin drip at 1000 units/hr. Check HL at 2000. CBC daily while on heparin drip.  83, PharmD 08/27/2020,9:51 AM

## 2020-08-27 NOTE — Progress Notes (Signed)
ANTICOAGULATION CONSULT NOTE  Pharmacy Consult for heparin Indication: atrial fibrillation  Allergies  Allergen Reactions  . Horse-Derived Products Anaphylaxis    Patient Measurements: Height: 6\' 1"  (185.4 cm) Weight: 70.1 kg (154 lb 8.7 oz) IBW/kg (Calculated) : 79.9 Heparin Dosing Weight: 70 kg  Vital Signs: Temp: 97.7 F (36.5 C) (11/19 1900) Temp Source: Oral (11/19 1900) BP: 95/72 (11/19 2200) Pulse Rate: 74 (11/19 2200)  Labs: Recent Labs    08/25/20 0659 08/25/20 0659 08/26/20 0545 08/27/20 0416 08/27/20 2204  HGB 11.9*   < > 10.7* 10.4*  --   HCT 36.3*  --  32.0* 31.8*  --   PLT 266  --  212 219  --   HEPARINUNFRC  --   --   --   --  0.28*  CREATININE 1.27*  --  1.25*  --   --    < > = values in this interval not displayed.    Estimated Creatinine Clearance: 36.6 mL/min (A) (by C-G formula based on SCr of 1.25 mg/dL (H)).   Medical History: Past Medical History:  Diagnosis Date  . Arthritis   . Benign head tremor   . Carotid artery occlusion    right side   . Dysrhythmia    irregular heart beat per patient- skips a beat   . Enlarged prostate   . GERD (gastroesophageal reflux disease)   . Hard of hearing   . Hyperlipidemia   . Sore throat      Assessment: 86 year old male who presented with concern for bloody diarrhea, hypotensive on admission. Patient is being treated for respiratory failure s/t pneumonia and HF. Patient started on furosemide drip to help with diuresis. Patient with afib with RVR, Cardiology started on digoxin. Continued to have issues with afib and was started on amiodarone drip. Cardiology consult for heparin drip with persistent afib. No further concerns for GIB.  Goal of Therapy:  Heparin level 0.3-0.7 units/ml Monitor platelets by anticoagulation protocol: Yes   Plan:  11/19:  HL @ 2204 = 0.28 Will order Heparin 1050 units IV X 1 and increase drip rate to 1250 units/hr. Will recheck HL 8 hrs after rate change.    Shaden Higley D, PharmD 08/27/2020,10:53 PM

## 2020-08-28 LAB — CBC
HCT: 30.6 % — ABNORMAL LOW (ref 39.0–52.0)
Hemoglobin: 10.3 g/dL — ABNORMAL LOW (ref 13.0–17.0)
MCH: 31.2 pg (ref 26.0–34.0)
MCHC: 33.7 g/dL (ref 30.0–36.0)
MCV: 92.7 fL (ref 80.0–100.0)
Platelets: 208 10*3/uL (ref 150–400)
RBC: 3.3 MIL/uL — ABNORMAL LOW (ref 4.22–5.81)
RDW: 14 % (ref 11.5–15.5)
WBC: 12.3 10*3/uL — ABNORMAL HIGH (ref 4.0–10.5)
nRBC: 0 % (ref 0.0–0.2)

## 2020-08-28 LAB — CULTURE, BLOOD (ROUTINE X 2)
Culture: NO GROWTH
Culture: NO GROWTH
Special Requests: ADEQUATE
Special Requests: ADEQUATE

## 2020-08-28 LAB — PROCALCITONIN: Procalcitonin: <0.1 ng/mL

## 2020-08-28 LAB — BASIC METABOLIC PANEL
Anion gap: 10 (ref 5–15)
BUN: 38 mg/dL — ABNORMAL HIGH (ref 8–23)
CO2: 30 mmol/L (ref 22–32)
Calcium: 9.8 mg/dL (ref 8.9–10.3)
Chloride: 99 mmol/L (ref 98–111)
Creatinine, Ser: 1.57 mg/dL — ABNORMAL HIGH (ref 0.61–1.24)
GFR, Estimated: 41 mL/min — ABNORMAL LOW (ref >60)
Glucose, Bld: 141 mg/dL — ABNORMAL HIGH (ref 70–99)
Potassium: 3.1 mmol/L — ABNORMAL LOW (ref 3.5–5.1)
Sodium: 139 mmol/L (ref 135–145)

## 2020-08-28 LAB — HEPARIN LEVEL (UNFRACTIONATED)
Heparin Unfractionated: 0.51 IU/mL (ref 0.30–0.70)
Heparin Unfractionated: 0.57 IU/mL (ref 0.30–0.70)

## 2020-08-28 MED ORDER — IPRATROPIUM-ALBUTEROL 0.5-2.5 (3) MG/3ML IN SOLN: 3.0000 mL | RESPIRATORY_TRACT | Status: DC | PRN

## 2020-08-28 MED ORDER — POTASSIUM CHLORIDE CRYS ER 20 MEQ PO TBCR
40.0000 meq | EXTENDED_RELEASE_TABLET | Freq: Once | ORAL | Status: AC
  Administered 2020-08-28: 40 meq via ORAL
  Filled 2020-08-28: qty 2

## 2020-08-28 NOTE — Progress Notes (Addendum)
ANTICOAGULATION CONSULT NOTE  Pharmacy Consult for heparin Indication: atrial fibrillation  Allergies  Allergen Reactions  . Horse-Derived Products Anaphylaxis    Patient Measurements: Height: 6\' 1"  (185.4 cm) Weight: 70.5 kg (155 lb 6.8 oz) IBW/kg (Calculated) : 79.9 Heparin Dosing Weight: 70 kg  Vital Signs: Temp: 98.1 F (36.7 C) (11/20 0800) Temp Source: Oral (11/20 0800) BP: 110/83 (11/20 1400) Pulse Rate: 106 (11/20 1300)  Labs: Recent Labs    08/26/20 0545 08/26/20 0545 08/27/20 0416 08/27/20 2204 08/28/20 0705 08/28/20 1451  HGB 10.7*   < > 10.4*  --  10.3*  --   HCT 32.0*  --  31.8*  --  30.6*  --   PLT 212  --  219  --  208  --   HEPARINUNFRC  --   --   --  0.28* 0.51 0.57  CREATININE 1.25*  --   --   --  1.57*  --    < > = values in this interval not displayed.    Estimated Creatinine Clearance: 29.3 mL/min (A) (by C-G formula based on SCr of 1.57 mg/dL (H)).   Medical History: Past Medical History:  Diagnosis Date  . Arthritis   . Benign head tremor   . Carotid artery occlusion    right side   . Dysrhythmia    irregular heart beat per patient- skips a beat   . Enlarged prostate   . GERD (gastroesophageal reflux disease)   . Hard of hearing   . Hyperlipidemia   . Sore throat     Assessment: 86 year old male who presented with concern for bloody diarrhea, hypotensive on admission. Patient is being treated for respiratory failure s/t pneumonia and HF. Patient started on furosemide drip to help with diuresis. Patient with afib with RVR, Cardiology started on digoxin. Continued to have issues with afib and was started on amiodarone drip. Cardiology consult for heparin drip with persistent afib. No further concerns for GIB.  11/20 0705 HL 0.51  11/20 1451 HL 0.57  Goal of Therapy:  Heparin level 0.3-0.7 units/ml Monitor platelets by anticoagulation protocol: Yes    Plan:  - 1120 @ 1451 HL 0.57 therapeutic - will continue heparin infusion at  1250 units/hr - HL therapeutic x 2 - Recheck heparin level in AM - CBC daily while on heparin  12/20, PharmD Pharmacy Resident  08/28/2020 4:45 PM

## 2020-08-28 NOTE — Progress Notes (Signed)
SUBJECTIVE: Patient resting in bed. Denies chest pain or shortness of breath but continues to have high oxygen requirements.   Vitals:   08/28/20 0400 08/28/20 0408 08/28/20 0500 08/28/20 0600  BP: 114/61  104/62 100/72  Pulse: (!) 43     Resp: (!) 21  19 19   Temp: 98.2 F (36.8 C)     TempSrc: Oral     SpO2: 100%     Weight:  70.5 kg    Height:        Intake/Output Summary (Last 24 hours) at 08/28/2020 0906 Last data filed at 08/28/2020 0600 Gross per 24 hour  Intake 963.66 ml  Output 700 ml  Net 263.66 ml    LABS: Basic Metabolic Panel: Recent Labs    08/26/20 0545 08/27/20 0416 08/28/20 0705  NA 139  --  139  K 3.4*  --  3.1*  CL 101  --  99  CO2 27  --  30  GLUCOSE 145*  --  141*  BUN 31*  --  38*  CREATININE 1.25*  --  1.57*  CALCIUM 9.1  --  9.8  MG 2.1 2.2  --   PHOS 3.9 3.7  --    Liver Function Tests: No results for input(s): AST, ALT, ALKPHOS, BILITOT, PROT, ALBUMIN in the last 72 hours. No results for input(s): LIPASE, AMYLASE in the last 72 hours. CBC: Recent Labs    08/26/20 0545 08/26/20 0545 08/27/20 0416 08/28/20 0705  WBC 12.1*   < > 13.7* 12.3*  NEUTROABS 11.0*  --  12.3*  --   HGB 10.7*   < > 10.4* 10.3*  HCT 32.0*   < > 31.8* 30.6*  MCV 94.1   < > 93.5 92.7  PLT 212   < > 219 208   < > = values in this interval not displayed.   Cardiac Enzymes: No results for input(s): CKTOTAL, CKMB, CKMBINDEX, TROPONINI in the last 72 hours. BNP: Invalid input(s): POCBNP D-Dimer: No results for input(s): DDIMER in the last 72 hours. Hemoglobin A1C: No results for input(s): HGBA1C in the last 72 hours. Fasting Lipid Panel: No results for input(s): CHOL, HDL, LDLCALC, TRIG, CHOLHDL, LDLDIRECT in the last 72 hours. Thyroid Function Tests: No results for input(s): TSH, T4TOTAL, T3FREE, THYROIDAB in the last 72 hours.  Invalid input(s): FREET3 Anemia Panel: No results for input(s): VITAMINB12, FOLATE, FERRITIN, TIBC, IRON, RETICCTPCT in the  last 72 hours.   PHYSICAL EXAM General: Well developed, well nourished, in no acute distress HEENT:  Normocephalic and atramatic Neck:  No JVD.  Lungs: Scattered rhonchi bilaterally Heart: Atrial fibrillation. Abdomen: Bowel sounds are positive, abdomen soft and non-tender  Msk:  Back normal, normal gait. Normal strength and tone for age. Extremities: No clubbing, cyanosis or edema.   Neuro: Alert and oriented X 2. Psych:  Good affect, responds appropriately  TELEMETRY: Atrial Fibrillation. 98/BPM  ASSESSMENT AND PLAN: Patient presenting to the emergency department with progressive generalized weakness found to have pneumonia sepsis and since now has HFrEF exacerbation and atrial fibrillation with RVR. Patient remains in atrial fibrillation currently and recommend continuing Amio infusion, digoxin 0.250mg  daily, and heparin infusion. BP stable, continue low dose BB. Patient remains on high oxygen requirements with worsening breath sounds today, recommend continued goals of care conversions. We will continue to follow.  Principal Problem:   Severe sepsis with septic shock [facility]) Active Problems:   Chronic kidney disease, stage 3a (HCC)   Rectal bleeding   Acute on chronic  combined systolic and diastolic CHF (congestive heart failure) (HCC)   Acute hypoxemic respiratory failure (HCC)   Goals of care, counseling/discussion   Palliative care by specialist   Advanced care planning/counseling discussion   Failure to thrive in adult   Protein-calorie malnutrition, severe   Aspiration pneumonia of both lower lobes due to gastric secretions (HCC)   Hyponatremia    Maryelizabeth Kaufmann, NP-C 08/28/2020 9:06 AM

## 2020-08-28 NOTE — Progress Notes (Signed)
NAME:  Jeffery Smith, MRN:  [id number], DOB:  [birth year], LOS: 5 ADMISSION DATE:  08/30/2020, CONSULTATION DATE:  09/02/2020 REFERRING MD:  TRH, CHIEF COMPLAINT:  Septic shock  Brief History   86 year old presenting from home with rectal bleeding and diarrhea after recent fecal disimpaction with progressive generalized weakness and poor PO intake.  Hypotensive with mild AKI with CXR consistent with multifocal pneumonia.  Hgb stable.  Initially admitted to [facility], however had worsening hypotension requiring vasopressor support and hypoxia now on NRB, PCCM consulted for further ICU management.   History of present illness   HPI obtained from patient and patient's wife, Kathie Rhodes, and their daughter Jamesetta So at bedside, as patient is poor historian.   85 year old male with prior history of systolic and diastolic HF with recent EF 35-40% on 06/28/2020 with LifeVest, moderate to severe AS, GERD, BPH, HLD, and HOH presenting from home with two day history of progressive generalized weakness, poor PO intake/ appetite, and rectal bleeding with diarrhea.    He was evaluated on 11/13 in [facility] ER for constipation, found to have a fecal impaction and therefore manually disimpacted and given enema.  Wife reports she last gave him a laxative yesterday.  Reported bright red blood with diarrhea at home but blood only on his depends, and not mixed in stool per wife.  Additionally over the last month, he has received a steroid injection for his right hip 11/2, sustained a fall at home, and he was taken off his carvedilol and losartan by cardiology for ongoing hypotension.  Wife reports he was treated for pneumonia 1-2 weeks ago (? 10/29) with doxycyline.  At that time, he did not have fever but had a productive cough, since resolved.  Denies any recent fever or chills other than patient always being cold, no vomiting, focal weakness, LOC, chest pain, leg swelling, abdominal pain, dysuria, or vomiting.  Does report some  clear sputum which is pink-tinged at times.  Patient reported some SOB earlier today, since resolved.  No reports of obvious dysphagia or coughing episodes after eating but wife noted the other day, he held some [place] bacon in his mouth and kept chewing like he could not remember to swallow.    In ER, patient afebrile with initial BP 85/66, tachypnea and 90% on room air.  Labs noted for WBC 36.3, Hgb stable 13.8, Na 133, K 5.1, CL 97, glucose 114, BUN 39, sCr 1.5 (baseline  ~1.2 - 1.4), low albumin and protein, normal coags, SARS2/ flu negative, CXR consistent with multifocal pneumonia, CT abdomen/ pelvis showed  mass-like mural thickening of the distal rectum with associated mucosal hyperenhancement; suggest sigmoidoscopy to exclude possibility of infiltrative neoplasm.  Additionally noted for bilateral lower lobe consolidation, right greater than left, with small pleural effusion.  Blood cultures sent and given vancomycin, zosyn, and cefepime in ER.  He was given a 1L NS bolus with some improvement in hypotension, however ended up on peripheral levophed.  He was placed on nasal cannula however had acute increase in oxygen requirements to NRB.  Wife reports he ate on a boxed lunch in the ER and then got sicker.   He was admitted to [facility] however given vasopressor and oxygen requirements, PCCM consulted for further ICU management.    08/28/20- patient seems to have improved he remains on HFNC.    Past Medical History  Systolic and diastolic HF with EF 35-40% (06/28/2020) with LifeVest, moderate to severe AS, GERD, BPH, HLD, HOH  Significant Hospital Events   11/15 Admitted   Consults:  Cardiology GI  Procedures:   Significant Diagnostic Tests:  11/15 CT a/p >> 1. Mass-like mural thickening of the distal rectum with some associated mucosal hyperenhancement. Correlation with sigmoidoscopy is suggested to exclude the possibility of infiltrative neoplasm. 2. Areas of airspace consolidation in the  lower lobes of the lungs bilaterally (right greater than left), concerning for multilobar pneumonia. 3. Small right parapneumonic pleural effusion. 4. Cholelithiasis or biliary sludge lying dependently in the gallbladder, without evidence of acute cholecystitis at this time. 5. Colonic diverticulosis. 6. 2 mm nonobstructive calculus in the lower pole collecting system of the left kidney. 7. Aortic atherosclerosis. 8. Additional incidental findings, as above.  Micro Data:  11/15 SARS 2/ Flu >> neg 11/15 BCx2 >> 11/15 MRSA >>   Antimicrobials:  11/15 vanc 11/15 zosyn  11/15 cefepime 11/15 azithro >> 11/16 unasyn >>    CC Follow up shock  HPI More alert awake Pressors dosage improving Speech path assessment pending  Objective   Blood pressure 110/83, pulse (!) 106, temperature 98.1 F (36.7 C), temperature source Oral, resp. rate (!) 24, height 6\' 1"  (1.854 m), weight 70.5 kg, SpO2 93 %.    FiO2 (%):  [60 %-80 %] 60 %   Intake/Output Summary (Last 24 hours) at 08/28/2020 1614 Last data filed at 08/28/2020 1400 Gross per 24 hour  Intake 1187.02 ml  Output 400 ml  Net 787.02 ml   Filed Weights   08/24/20 0500 08/25/20 0500 08/28/20 0408  Weight: 69.4 kg 70.1 kg 70.5 kg    Review of Systems:  Gen:  Denies  fever, sweats, chills weight loss  Other:  All other systems negative    Physical Examination:   General Appearance: No distress  Neuro:without focal findings,  speech normal,  HEENT: PERRLA, EOM intact.   Pulmonary: normal breath sounds, No wheezing.  CardiovascularNormal S1,S2.  No m/r/g.   Abdomen: Benign, Soft, non-tender. PSYCHIATRIC: Mood, affect within normal limits.   ALL OTHER ROS ARE NEGATIVE    Assessment & Plan:   Septic shock secondary to pneumonia Wean off pressors Sepsis improving Continue IV abx Start midodrine Oxygen as needed    -possible PCP pneumonia -poor prognosis  Combined systolic and diastolic HF with EF 35-40% on  06/2020 with LifeVest Moderate to severe AS Very poor prognosis  Failure to thrive/ protein calorie malnutrition, BMI 20.98 - on chart review-> weight 77.1 on 07/12/2020 to now 72.1    Recommend Palliative care consultation to address goals of care    Critical care provider statement:    Critical care time (minutes):  33   Critical care time was exclusive of:  Separately billable procedures and  treating other patients   Critical care was necessary to treat or prevent imminent or  life-threatening deterioration of the following conditions:  acute systolic CHF exacerbation , severe hypoxemia acutely, multiple comoribid conditions.    Critical care was time spent personally by me on the following  activities:  Development of treatment plan with patient or surrogate,  discussions with consultants, evaluation of patient's response to  treatment, examination of patient, obtaining history from patient or  surrogate, ordering and performing treatments and interventions, ordering  and review of laboratory studies and re-evaluation of patient's condition   I assumed direction of critical care for this patient from another  provider in my specialty: no     09/11/2020, M.D.  Pulmonary & Critical Care Medicine  [facility]

## 2020-08-28 NOTE — Progress Notes (Signed)
ANTICOAGULATION CONSULT NOTE  Pharmacy Consult for heparin Indication: atrial fibrillation  Allergies  Allergen Reactions  . Horse-Derived Products Anaphylaxis    Patient Measurements: Height: 6\' 1"  (185.4 cm) Weight: 70.5 kg (155 lb 6.8 oz) IBW/kg (Calculated) : 79.9 Heparin Dosing Weight: 70 kg  Vital Signs: Temp: 98.2 F (36.8 C) (11/20 0400) Temp Source: Oral (11/20 0400) BP: 100/72 (11/20 0600) Pulse Rate: 43 (11/20 0400)  Labs: Recent Labs    08/26/20 0545 08/26/20 0545 08/27/20 0416 08/27/20 2204 08/28/20 0705  HGB 10.7*   < > 10.4*  --  10.3*  HCT 32.0*  --  31.8*  --  30.6*  PLT 212  --  219  --  208  HEPARINUNFRC  --   --   --  0.28* 0.51  CREATININE 1.25*  --   --   --  1.57*   < > = values in this interval not displayed.    Estimated Creatinine Clearance: 29.3 mL/min (A) (by C-G formula based on SCr of 1.57 mg/dL (H)).   Medical History: Past Medical History:  Diagnosis Date  . Arthritis   . Benign head tremor   . Carotid artery occlusion    right side   . Dysrhythmia    irregular heart beat per patient- skips a beat   . Enlarged prostate   . GERD (gastroesophageal reflux disease)   . Hard of hearing   . Hyperlipidemia   . Sore throat      Assessment: 86 year old male who presented with concern for bloody diarrhea, hypotensive on admission. Patient is being treated for respiratory failure s/t pneumonia and HF. Patient started on furosemide drip to help with diuresis. Patient with afib with RVR, Cardiology started on digoxin. Continued to have issues with afib and was started on amiodarone drip. Cardiology consult for heparin drip with persistent afib. No further concerns for GIB.  11/20 0705 HL 0.51.   Goal of Therapy:  Heparin level 0.3-0.7 units/ml Monitor platelets by anticoagulation protocol: Yes   Plan:  Heparin level is therapeutic. Will continue heparin infusion at 1250 units/hr. Recheck heparin level in 8 hours. CBC daily while  on heparin.   12/20, PharmD, BCPS 08/28/2020,7:57 AM

## 2020-08-29 ENCOUNTER — Inpatient Hospital Stay: Payer: Medicare Other

## 2020-08-29 ENCOUNTER — Encounter: Payer: Self-pay | Admitting: Internal Medicine

## 2020-08-29 LAB — CBC
HCT: 32 % — ABNORMAL LOW (ref 39.0–52.0)
Hemoglobin: 10.5 g/dL — ABNORMAL LOW (ref 13.0–17.0)
MCH: 30.6 pg (ref 26.0–34.0)
MCHC: 32.8 g/dL (ref 30.0–36.0)
MCV: 93.3 fL (ref 80.0–100.0)
Platelets: 202 10*3/uL (ref 150–400)
RBC: 3.43 MIL/uL — ABNORMAL LOW (ref 4.22–5.81)
RDW: 14.1 % (ref 11.5–15.5)
WBC: 16.8 10*3/uL — ABNORMAL HIGH (ref 4.0–10.5)
nRBC: 0 % (ref 0.0–0.2)

## 2020-08-29 LAB — BASIC METABOLIC PANEL
Anion gap: 9 (ref 5–15)
BUN: 49 mg/dL — ABNORMAL HIGH (ref 8–23)
CO2: 29 mmol/L (ref 22–32)
Calcium: 9.8 mg/dL (ref 8.9–10.3)
Chloride: 101 mmol/L (ref 98–111)
Creatinine, Ser: 1.78 mg/dL — ABNORMAL HIGH (ref 0.61–1.24)
GFR, Estimated: 35 mL/min — ABNORMAL LOW (ref >60)
Glucose, Bld: 136 mg/dL — ABNORMAL HIGH (ref 70–99)
Potassium: 3.3 mmol/L — ABNORMAL LOW (ref 3.5–5.1)
Sodium: 139 mmol/L (ref 135–145)

## 2020-08-29 LAB — HEPARIN LEVEL (UNFRACTIONATED): Heparin Unfractionated: 0.46 IU/mL (ref 0.30–0.70)

## 2020-08-29 LAB — MAGNESIUM: Magnesium: 2.4 mg/dL (ref 1.7–2.4)

## 2020-08-29 MED ORDER — POTASSIUM CHLORIDE CRYS ER 20 MEQ PO TBCR
40.0000 meq | EXTENDED_RELEASE_TABLET | ORAL | Status: DC
  Administered 2020-08-29: 40 meq via ORAL
  Filled 2020-08-29: qty 2

## 2020-08-29 MED ORDER — MORPHINE 100MG IN NS 100ML (1MG/ML) PREMIX INFUSION
1.0000 mg/h | INTRAVENOUS | Status: DC
  Administered 2020-08-29: 2 mg/h via INTRAVENOUS
  Filled 2020-08-29: qty 100

## 2020-09-01 LAB — FUNGITELL, SERUM: Fungitell Result: <31 pg/mL (ref <80)

## 2020-09-08 NOTE — Progress Notes (Signed)
Elink provided video call with family. Some family were unable to connect after several tries. Elink assisted as much as possible to connect with family.

## 2020-09-08 NOTE — Progress Notes (Signed)
Elink provided video call with family.

## 2020-09-08 NOTE — Death Summary Note (Signed)
DEATH SUMMARY   Patient Details  Name: Jeffery Smith MRN: [id number] DOB: July 12, 1925  Admission/Discharge Information   Admit Date:  09-10-2020  Date of Death: Date of Death: Sep 16, 2020  Time of Death: Time of Death: Feb 12, 1748  Length of Stay: 6  Referring Physician: System, Provider Not In   Reason(s) for Hospitalization  Septic Shock secondary to Pneumonia Acute Hypoxemic Respiratory Failure Aspiration Pneumonia Acute on Chronic combined Systolic and Diastolic CHF Atrial Fibrillation w/ RVR CKD Stage III  Failure to Thrive  Diagnoses  Preliminary cause of death:   Septic shock secondary to Pneumonia Secondary Diagnoses (including complications and co-morbidities):  Principal Problem:   Severe sepsis with septic shock (HCC) Active Problems:   Chronic kidney disease, stage 3a (HCC)   Rectal bleeding   Acute on chronic combined systolic and diastolic CHF (congestive heart failure) (HCC)   Acute hypoxemic respiratory failure (HCC)   Goals of care, counseling/discussion   Palliative care by specialist   Advanced care planning/counseling discussion   Failure to thrive in adult   Protein-calorie malnutrition, severe   Aspiration pneumonia of both lower lobes due to gastric secretions [facility])   Hyponatremia   Brief Hospital Course (including significant findings, care, treatment, and services provided and events leading to death)  Jeffery Smith is a 86 y.o. year old male who presented to [facility] ER at the request of his primary care provider for evaluation of generalized weakness and rectal bleeding for 2 days.  Patient was recently seen at [facility] for constipation and was found to have fecal impaction, he was manually disimpacted and received an enema.   Following this his wife states that he has had diarrhea for 2 days containing bright red blood.  Patient has had poor oral intake as well.  He was hypotensive upon arrival to the ER with systolic blood pressure  in the 70s and this has improved to with IV fluid resuscitation.  He has marked leukocytosis and imaging is suggestive of multifocal pneumonia.  Of note Patient recently completed antibiotic therapy about a week ago for community-acquired pneumonia.  He also had a recent steroid injection to his right hip.  He will be admitted to the hospital for further evaluation.  ED Course Upon presentation to the ED, patient afebrile with initial BP 85/66, tachypnea and 90% on room air.  Labs noted for WBC 36.3, Hgb stable 13.8, Na 133, K 5.1, CL 97, glucose 114, BUN 39, sCr 1.5 (baseline  ~1.2 - 1.4), low albumin and protein, normal coags, SARS2/ flu negative, CXR consistent with multifocal pneumonia, CT abdomen/ pelvis showed  mass-like mural thickening of the distal rectum with associated mucosal hyperenhancement; suggest sigmoidoscopy to exclude possibility of infiltrative neoplasm.  Additionally noted for bilateral lower lobe consolidation, right greater than left, with small pleural effusion.  Blood cultures sent and given vancomycin, zosyn, and cefepime in ER.  He was given a 1L NS bolus with some improvement in hypotension, however ended up on peripheral levophed.  He was placed on nasal cannula however had acute increase in oxygen requirements to NRB.  Wife reports he ate on a boxed lunch in the ER and then got sicker. He was to be admitted to hospital by [facility].  However he began to decline requiring vasopressors and increased oxygen requirements, PCCM was consulted for further ICU management.   Hospital Course He was able to be weaned off vasopressors, however continued to require high amounts of FiO2 via  HHNC.  Cardiology  was consulted for assistance in managing Atrial Fibrillation with RVR and HFrEF exacerbation.  He was placed on Heparin and Amiodarone infusions.  However overnight 08/28/20 and on 10/04/2020 he developed hemoptysis of which his heparin infusion was discontinued.  He also developed  worsening oxygenation status (90% FiO2 HHFNC).  Given that he was not clinically improving due to possible worsening Pneumonia vs. HFrEF exacerbation, goals of care discussion were held with his family.  Given his worsening clinical status along with significant comorbidities (HFrEF, severe Aortic Stenosis and Mitral Regurgitation) and poor prognosis, his family elected to transition to comfort care measures later in the day on 10/04/2020.   Pertinent Labs and Studies  Significant Diagnostic Studies CT ABDOMEN PELVIS W CONTRAST  Result Date: 08/17/2020 CLINICAL DATA:  86 year old male with history of acute onset of nonlocalized abdominal pain. Blood from rectum. EXAM: CT ABDOMEN AND PELVIS WITH CONTRAST TECHNIQUE: Multidetector CT imaging of the abdomen and pelvis was performed using the standard protocol following bolus administration of intravenous contrast. CONTRAST:  75mL OMNIPAQUE IOHEXOL 300 MG/ML  SOLN COMPARISON:  No priors. FINDINGS: Lower chest: Electronic devices in the subcutaneous fat of the lower back causing extensive beam hardening artifact. Small right pleural effusion lying dependently. Patchy airspace consolidation in the right lower lobe. Patchy peribronchovascular ground-glass attenuation and septal thickening also noted in the lower lobes of the lungs bilaterally. Aortic atherosclerosis. Small hiatal hernia. Hepatobiliary: 1.5 cm low-attenuation lesion in the central aspect of the right lobe of the liver (axial image 18 of series 2), compatible with a simple cyst. Several other tiny 2-3 mm low-attenuation lesions noted in the liver, too small to characterize, but statistically likely to represent tiny cysts or biliary hamartomas. No definite suspicious hepatic lesions. No intra or extrahepatic biliary ductal dilatation. Trace amount of high attenuation material lying dependently in the gallbladder, likely to reflect biliary sludge or tiny noncalcified gallstones. No findings to suggest an  acute cholecystitis at this time. Pancreas: No pancreatic mass. No pancreatic ductal dilatation. No pancreatic or peripancreatic fluid collections or inflammatory changes. Spleen: Unremarkable. Adrenals/Urinary Tract: 2 mm nonobstructive calculus in the lower pole collecting system of the left kidney. Bilateral kidneys and adrenal glands are otherwise normal in appearance. No hydroureteronephrosis. Urinary bladder is normal in appearance. Stomach/Bowel: Normal appearance of the stomach. No pathologic dilatation of small bowel or colon. Masslike mural thickening in the distal rectum where there is also some mucosal hyperenhancement, best appreciated on axial images 83-86 of series 2. Numerous colonic diverticulae are noted, without surrounding inflammatory changes to suggest an acute diverticulitis at this time. Vascular/Lymphatic: Aortic atherosclerosis, without evidence of aneurysm or dissection in the abdominal or pelvic vasculature. No lymphadenopathy noted in the abdomen or pelvis. Reproductive: Prostate gland and seminal vesicles are unremarkable in appearance. Other: No significant volume of ascites.  No pneumoperitoneum. Musculoskeletal: Chronic appearing T12 compression fracture with 30% loss of anterior vertebral body height. There are no aggressive appearing lytic or blastic lesions noted in the visualized portions of the skeleton. IMPRESSION: 1. Mass-like mural thickening of the distal rectum with some associated mucosal hyperenhancement. Correlation with sigmoidoscopy is suggested to exclude the possibility of infiltrative neoplasm. 2. Areas of airspace consolidation in the lower lobes of the lungs bilaterally (right greater than left), concerning for multilobar pneumonia. 3. Small right parapneumonic pleural effusion. 4. Cholelithiasis or biliary sludge lying dependently in the gallbladder, without evidence of acute cholecystitis at this time. 5. Colonic diverticulosis. 6. 2 mm nonobstructive calculus  in the lower pole  collecting system of the left kidney. 7. Aortic atherosclerosis. 8. Additional incidental findings, as above. Electronically Signed   By: Trudie Reed M.D.   On: 2020/09/19 13:08   DG Chest Port 1 View  Result Date: 08/18/2020 CLINICAL DATA:  Acute respiratory failure with hypoxia. EXAM: PORTABLE CHEST 1 VIEW COMPARISON:  08/27/2020 FINDINGS: Patient is slightly rotated to the left. Lungs are adequately inflated demonstrate continued moderate bilateral patchy airspace process without significant change. Findings suggesting small right pleural effusion without significant change. Cardiomediastinal silhouette and remainder of the exam is unchanged. IMPRESSION: Stable bilateral patchy airspace process likely multifocal pneumonia. Stable small right pleural effusion. Electronically Signed   By: Elberta Fortis M.D.   On: 08/22/2020 08:37   DG Chest Port 1 View  Result Date: 08/27/2020 CLINICAL DATA:  Acute respiratory failure. EXAM: PORTABLE CHEST 1 VIEW COMPARISON:  08/24/2020. FINDINGS: Heart size stable. Diffuse bilateral pulmonary infiltrates/edema with interim progression on the right. Small bilateral pleural effusions again noted. Small wedge-shaped density in the right upper chest again noted. This may be secondary to atelectasis and or pleural thickening. This is less prominent than noted on prior exam. Previously identified small wedge-shaped density left base not visualized on today's exam. IMPRESSION: 1. Diffuse bilateral pulmonary infiltrates/edema with interim progression on the right. Small bilateral pleural effusions again noted. 2. Small wedge-shaped density in the right upper chest again noted. This may be secondary to atelectasis and or pleural thickening. This is less prominent than noted on prior exam. Previously identified small wedge-shaped density left base not visualized on today's exam. Electronically Signed   By: Maisie Fus  Register   On: 08/27/2020 07:14   DG Chest  Port 1 View  Result Date: 08/24/2020 CLINICAL DATA:  Respiratory failure.  Hypoxia. EXAM: PORTABLE CHEST 1 VIEW COMPARISON:  09/19/20.  07/12/2020. FINDINGS: Stable cardiomegaly. Diffuse severe bilateral pulmonary infiltrates again noted without interim change. Small wedge-shaped densities noted over the right upper and left lower lungs. Although these changes may be secondary to atelectasis, pulmonary infarcts cannot be excluded. No prominent pleural effusion. No pneumothorax. Degenerative change thoracic spine. IMPRESSION: 1. Diffuse severe bilateral pulmonary infiltrates again noted without interim change. 2. Small wedge-shaped densities noted over the right upper and left lower lungs. Although these changes may be secondary to atelectasis, pulmonary infarcts cannot be excluded. 3.  Stable cardiomegaly. Electronically Signed   By: Maisie Fus  Register   On: 08/24/2020 07:09   DG Chest Portable 1 View  Result Date: Sep 19, 2020 CLINICAL DATA:  Shortness of breath.  Patient with life vest. EXAM: PORTABLE CHEST 1 VIEW COMPARISON:  Earlier today FINDINGS: Hardware associated with patient's life vest is again noted. Stable cardiomediastinal silhouette. Diffuse bilateral interstitial and airspace opacities are again noted throughout both lungs. When compared with the previous exam there is been worsening opacification of the right mid lung. IMPRESSION: 1. Diffuse bilateral interstitial and airspace opacities are again noted with worsening aeration to the right midlung. Electronically Signed   By: Signa Kell M.D.   On: 09/19/2020 18:07   DG Chest Port 1 View  Result Date: 19-Sep-2020 CLINICAL DATA:  Rectal bleeding and diarrhea. Concern for potential sepsis EXAM: PORTABLE CHEST 1 VIEW COMPARISON:  July 12, 2020 FINDINGS: There is airspace opacity throughout multiple sites in the right lung. There is underlying hyperexpansion, stable. There is diffuse interstitial thickening throughout the lungs, stable.  Heart size and pulmonary vascular normal. No adenopathy. There is aortic atherosclerosis. No bone lesions. IMPRESSION: Multifocal areas of airspace opacity consistent  with multifocal pneumonia throughout right lung. Underlying interstitial fibrosis and mild hyperexpansion. Heart size normal. No adenopathy evident. Aortic Atherosclerosis (ICD10-I70.0). Electronically Signed   By: Bretta Bang III M.D.   On: August 24, 2020 13:49   DG Abd Portable 1 View  Result Date: 08/21/2020 CLINICAL DATA:  Constipation EXAM: PORTABLE ABDOMEN - 1 VIEW COMPARISON:  July 02, 2020. FINDINGS: Air and stool-filled nondilated loops of bowel. Moderate colonic stool burden predominately in the LEFT hemicolon. Mild distension of loops of bowel in the RIGHT hemiabdomen. Reticulation of bilateral lung bases likely reflecting underlying interstitial lung disease versus pulmonary edema. Degenerative changes of the lumbar spine. Prominent bladder silhouette. IMPRESSION: 1. Moderate colonic stool burden predominately in the LEFT hemicolon. 2. Nonobstructive bowel gas pattern. 3. Reticulation of bilateral lung bases likely reflecting underlying interstitial lung disease versus pulmonary edema. Electronically Signed   By: Meda Klinefelter MD   On: 08/21/2020 17:56   ECHOCARDIOGRAM COMPLETE  Result Date: 08/24/2020    ECHOCARDIOGRAM REPORT   Patient Name:   ABEM SHADDIX Date of Exam: 08/24/2020 Medical Rec #:  [id number]            Height:       73.0 in Accession #:    [id number]           Weight:       153.0 lb Date of Birth:  08-28-1925            BSA:          1.920 m Patient Age:    86 years             BP:           84/61 mmHg Patient Gender: M                    HR:           103 bpm. Exam Location:  [facility] Procedure: 2D Echo, Color Doppler and Cardiac Doppler Indications:     Septic shock  History:         Patient has prior history of Echocardiogram examinations, most                  recent 06/28/2020. Risk  Factors:Dyslipidemia.  Sonographer:     Humphrey Rolls RDCS (AE) Referring Phys:  [id number] Caryn Bee PACKER Diagnosing Phys: Adrian Blackwater MD  Sonographer Comments: Suboptimal parasternal window. IMPRESSIONS  1. Left ventricular ejection fraction, by estimation, is 25 to 30%. The left ventricle has severely decreased function. The left ventricle demonstrates global hypokinesis. The left ventricular internal cavity size was severely dilated. There is mild concentric left ventricular hypertrophy. Left ventricular diastolic parameters are consistent with Grade III diastolic dysfunction (restrictive).  2. Right ventricular systolic function is mildly reduced. The right ventricular size is moderately enlarged. Mildly increased right ventricular wall thickness.  3. Left atrial size was moderately dilated.  4. Right atrial size was moderately dilated.  5. The mitral valve is grossly normal. Moderate to severe mitral valve regurgitation.  6. The tricuspid valve is abnormal. Tricuspid valve regurgitation is moderate to severe.  7. The aortic valve is abnormal. Aortic valve regurgitation is trivial. Mild to moderate aortic valve stenosis. Conclusion(s)/Recommendation(s): Findings consistent with dilated cardiomyopathy. FINDINGS  Left Ventricle: Left ventricular ejection fraction, by estimation, is 25 to 30%. The left ventricle has severely decreased function. The left ventricle demonstrates global hypokinesis. The left ventricular internal cavity size was severely dilated. There is mild concentric left  ventricular hypertrophy. Left ventricular diastolic parameters are consistent with Grade III diastolic dysfunction (restrictive). Right Ventricle: The right ventricular size is moderately enlarged. Mildly increased right ventricular wall thickness. Right ventricular systolic function is mildly reduced. Left Atrium: Left atrial size was moderately dilated. Right Atrium: Right atrial size was moderately dilated. Pericardium: There is no  evidence of pericardial effusion. Mitral Valve: The mitral valve is grossly normal. Moderate to severe mitral valve regurgitation. MV peak gradient, 6.5 mmHg. The mean mitral valve gradient is 3.0 mmHg. Tricuspid Valve: The tricuspid valve is abnormal. Tricuspid valve regurgitation is moderate to severe. Aortic Valve: The aortic valve is abnormal. Aortic valve regurgitation is trivial. Mild to moderate aortic stenosis is present. Aortic valve mean gradient measures 18.0 mmHg. Aortic valve peak gradient measures 36.0 mmHg. Aortic valve area, by VTI measures 1.20 cm. Pulmonic Valve: The pulmonic valve was grossly normal. Pulmonic valve regurgitation is mild. Aorta: The aortic root was not well visualized. IAS/Shunts: No atrial level shunt detected by color flow Doppler.  LEFT VENTRICLE PLAX 2D LVIDd:         4.88 cm  Diastology LVIDs:         4.25 cm  LV e' medial:    5.22 cm/s LV PW:         1.21 cm  LV E/e' medial:  23.1 LV IVS:        0.95 cm  LV e' lateral:   9.46 cm/s LVOT diam:     2.50 cm  LV E/e' lateral: 12.7 LV SV:         62 LV SV Index:   32 LVOT Area:     4.91 cm  RIGHT VENTRICLE RV Basal diam:  3.57 cm LEFT ATRIUM             Index       RIGHT ATRIUM           Index LA diam:        3.70 cm 1.93 cm/m  RA Area:     15.80 cm LA Vol (A2C):   54.2 ml 28.23 ml/m RA Volume:   43.30 ml  22.55 ml/m LA Vol (A4C):   37.6 ml 19.58 ml/m LA Biplane Vol: 46.3 ml 24.11 ml/m  AORTIC VALVE                    PULMONIC VALVE AV Area (Vmax):    1.10 cm     PV Vmax:       0.63 m/s AV Area (Vmean):   1.16 cm     PV Vmean:      40.800 cm/s AV Area (VTI):     1.20 cm     PV VTI:        0.092 m AV Vmax:           300.00 cm/s  PV Peak grad:  1.6 mmHg AV Vmean:          194.000 cm/s PV Mean grad:  1.0 mmHg AV VTI:            0.517 m AV Peak Grad:      36.0 mmHg AV Mean Grad:      18.0 mmHg LVOT Vmax:         67.20 cm/s LVOT Vmean:        45.900 cm/s LVOT VTI:          0.126 m LVOT/AV VTI ratio: 0.24  AORTA Ao Root diam:  3.30 cm MITRAL  VALVE                TRICUSPID VALVE MV Area (PHT): 7.25 cm     TR Peak grad:   49.0 mmHg MV Peak grad:  6.5 mmHg     TR Vmax:        350.00 cm/s MV Mean grad:  3.0 mmHg MV Vmax:       1.27 m/s     SHUNTS MV Vmean:      86.2 cm/s    Systemic VTI:  0.13 m MV Decel Time: 105 msec     Systemic Diam: 2.50 cm MV E velocity: 120.33 cm/s MV A velocity: 95.30 cm/s MV E/A ratio:  1.26 Adrian Blackwater MD Electronically signed by Adrian Blackwater MD Signature Date/Time: 08/24/2020/6:57:23 PM    Final     Microbiology Recent Results (from the past 240 hour(s))  Respiratory Panel by RT PCR (Flu A&B, Covid) - Nasopharyngeal Swab     Status: None   Collection Time: August 30, 2020 11:02 AM   Specimen: Nasopharyngeal Swab  Result Value Ref Range Status   SARS Coronavirus 2 by RT PCR NEGATIVE NEGATIVE Final    Comment: (NOTE) SARS-CoV-2 target nucleic acids are NOT DETECTED.  The SARS-CoV-2 RNA is generally detectable in upper respiratoy specimens during the acute phase of infection. The lowest concentration of SARS-CoV-2 viral copies this assay can detect is 131 copies/mL. A negative result does not preclude SARS-Cov-2 infection and should not be used as the sole basis for treatment or other patient management decisions. A negative result may occur with  improper specimen collection/handling, submission of specimen other than nasopharyngeal swab, presence of viral mutation(s) within the areas targeted by this assay, and inadequate number of viral copies (<131 copies/mL). A negative result must be combined with clinical observations, patient history, and epidemiological information. The expected result is Negative.  Fact Sheet for Patients:  https://www.moore.com/  Fact Sheet for Healthcare Providers:  https://www.young.biz/  This test is no t yet approved or cleared by the [place] FDA and  has been authorized for detection and/or diagnosis of SARS-CoV-2  by FDA under an Emergency Use Authorization (EUA). This EUA will remain  in effect (meaning this test can be used) for the duration of the COVID-19 declaration under Section 564(b)(1) of the Act, 21 U.S.C. section 360bbb-3(b)(1), unless the authorization is terminated or revoked sooner.     Influenza A by PCR NEGATIVE NEGATIVE Final   Influenza B by PCR NEGATIVE NEGATIVE Final    Comment: (NOTE) The Xpert Xpress SARS-CoV-2/FLU/RSV assay is intended as an aid in  the diagnosis of influenza from Nasopharyngeal swab specimens and  should not be used as a sole basis for treatment. Nasal washings and  aspirates are unacceptable for Xpert Xpress SARS-CoV-2/FLU/RSV  testing.  Fact Sheet for Patients: https://www.moore.com/  Fact Sheet for Healthcare Providers: https://www.young.biz/  This test is not yet approved or cleared by the [place] FDA and  has been authorized for detection and/or diagnosis of SARS-CoV-2 by  FDA under an Emergency Use Authorization (EUA). This EUA will remain  in effect (meaning this test can be used) for the duration of the  Covid-19 declaration under Section 564(b)(1) of the Act, 21  U.S.C. section 360bbb-3(b)(1), unless the authorization is  terminated or revoked. Performed at [facility], [address] [place], [place] [address]   Blood culture (routine x 2)     Status: None   Collection Time: 08/30/20 12:06 PM   Specimen: BLOOD  Result Value Ref Range Status   Specimen Description BLOOD RIGHT FA  Final   Special Requests   Final    BOTTLES DRAWN AEROBIC AND ANAEROBIC Blood Culture adequate volume   Culture   Final    NO GROWTH 5 DAYS Performed at [facility], [address] [place], [place] [address]    Report Status 08/28/2020 FINAL  Final  Blood culture (routine x 2)     Status: None   Collection Time: 2020/09/01 12:06 PM   Specimen: BLOOD  Result Value Ref Range Status    Specimen Description BLOOD LEFT [facility]  Final   Special Requests   Final    BOTTLES DRAWN AEROBIC AND ANAEROBIC Blood Culture adequate volume   Culture   Final    NO GROWTH 5 DAYS Performed at [facility], [address], [place], [place] [address]    Report Status 08/28/2020 FINAL  Final  MRSA PCR Screening     Status: None   Collection Time: 09/01/20  9:50 PM   Specimen: Nasal Mucosa; Nasopharyngeal  Result Value Ref Range Status   MRSA by PCR NEGATIVE NEGATIVE Final    Comment:        The GeneXpert MRSA Assay (FDA approved for NASAL specimens only), is one component of a comprehensive MRSA colonization surveillance program. It is not intended to diagnose MRSA infection nor to guide or monitor treatment for MRSA infections. Performed at [facility], [address] [place], [place] [address]     Lab Basic Metabolic Panel: Recent Labs  Lab 08/24/20 [phone] 08/25/20 2263 08/26/20 0545 08/27/20 0416 08/28/20 0705 09/07/2020 0538 09/03/2020 0705  NA 136 136 139  --  139 139  --   K 4.3 4.0 3.4*  --  3.1* 3.3*  --   CL 102 102 101  --  99 101  --   CO2 23 23 27   --  30 29  --   GLUCOSE 123* 142* 145*  --  141* 136*  --   BUN 32* 26* 31*  --  38* 49*  --   CREATININE 1.38* 1.27* 1.25*  --  1.57* 1.78*  --   CALCIUM 9.6 9.4 9.1  --  9.8 9.8  --   MG 2.2  --  2.1 2.2  --   --  2.4  PHOS  --   --  3.9 3.7  --   --   --    Liver Function Tests: Recent Labs  Lab September 01, 2020 1115  AST 37  ALT 18  ALKPHOS 81  BILITOT 1.3*  PROT 6.3*  ALBUMIN 2.6*   No results for input(s): LIPASE, AMYLASE in the last 168 hours. No results for input(s): AMMONIA in the last 168 hours. CBC: Recent Labs  Lab 09/01/20 1115 08/24/20 0537 08/25/20 0659 08/26/20 0545 08/27/20 0416 08/28/20 0705 08/12/2020 0538  WBC 36.3*  36.1*   < > 19.6* 12.1* 13.7* 12.3* 16.8*  NEUTROABS 33.0*  --   --  11.0* 12.3*  --   --   HGB 13.8  14.0   < > 11.9* 10.7* 10.4* 10.3* 10.5*  HCT  41.7  41.6   < > 36.3* 32.0* 31.8* 30.6* 32.0*  MCV 92.9  92.2   < > 94.3 94.1 93.5 92.7 93.3  PLT 372  377   < > 266 212 219 208 202   < > = values in this interval not displayed.   Cardiac Enzymes: No results for input(s): CKTOTAL,  CKMB, CKMBINDEX, TROPONINI in the last 168 hours. Sepsis Labs: Recent Labs  Lab 08/25/2020 1115 09/02/2020 1200 08/24/20 0537 08/24/20 0537 08/25/20 0659 08/25/20 0659 08/26/20 0545 08/27/20 0416 08/28/20 0705 September 17, 2020 0538  PROCALCITON   < >  --  0.29  --  0.26  --   --  0.16 <0.10  --   WBC   < >  --  31.9*   < > 19.6*   < > 12.1* 13.7* 12.3* 16.8*  LATICACIDVEN  --  1.6  --   --   --   --   --   --   --   --    < > = values in this interval not displayed.    Procedures/Operations  N/A       Harlon Ditty, [facility] Pulmonary & Critical Care Medicine Pager: [phone]  Judithe Modest 2020/09/17, 7:19 PM

## 2020-09-08 NOTE — Progress Notes (Signed)
PHARMACY CONSULT NOTE - FOLLOW UP  Pharmacy Consult for Electrolyte Monitoring and Replacement   Recent Labs: Potassium (mmol/L)  Date Value  09-27-20 3.3 (L)   Magnesium (mg/dL)  Date Value  63/84/5364 2.4   Calcium (mg/dL)  Date Value  68/12/2120 9.8   Albumin (g/dL)  Date Value  48/25/0037 2.6 (L)   Phosphorus (mg/dL)  Date Value  04/88/8916 3.7   Sodium (mmol/L)  Date Value  09/27/2020 139    Assessment: Patient admitted with progressive generalized weakness. Patient found to have sepsis secondary to pneumonia and HFrEF exacerbation and A.Fib w/RVR.  Patient has PMH of CKD and CHF. Patient is receiving Amiodarone and digoxin. Pharmacy consulted for electrolyte monitoring and replacement.   Goal of Therapy:  Magnesium >2 Potassium >4  Plan:  KCL every 4 hours x 2 doses ordered.   Will F/U with AM labs and continue to order replacement as needed.   Gardner Candle, PharmD, BCPS Clinical Pharmacist 09-27-2020 9:36 AM

## 2020-09-08 NOTE — Progress Notes (Signed)
NAME:  Jeffery Smith, MRN:  [id number], DOB:  September 26, 1925, LOS: 6 ADMISSION DATE:  08/25/2020, CONSULTATION DATE:  09/04/2020 REFERRING MD:  TRH, CHIEF COMPLAINT:  Septic shock  Brief History   86 year old presenting from home with rectal bleeding and diarrhea after recent fecal disimpaction with progressive generalized weakness and poor PO intake.  Hypotensive with mild AKI with CXR consistent with multifocal pneumonia.  Hgb stable.  Initially admitted to [facility], however had worsening hypotension requiring vasopressor support and hypoxia now on NRB, PCCM consulted for further ICU management.   History of present illness   HPI obtained from patient and patient's wife, Kathie Rhodes, and their daughter Jamesetta So at bedside, as patient is poor historian.   86 year old male with prior history of systolic and diastolic HF with recent EF 35-40% on 06/28/2020 with LifeVest, moderate to severe AS, GERD, BPH, HLD, and HOH presenting from home with two day history of progressive generalized weakness, poor PO intake/ appetite, and rectal bleeding with diarrhea.    He was evaluated on 11/13 in [facility] ER for constipation, found to have a fecal impaction and therefore manually disimpacted and given enema.  Wife reports she last gave him a laxative yesterday.  Reported bright red blood with diarrhea at home but blood only on his depends, and not mixed in stool per wife.  Additionally over the last month, he has received a steroid injection for his right hip 11/2, sustained a fall at home, and he was taken off his carvedilol and losartan by cardiology for ongoing hypotension.  Wife reports he was treated for pneumonia 1-2 weeks ago (? 10/29) with doxycyline.  At that time, he did not have fever but had a productive cough, since resolved.  Denies any recent fever or chills other than patient always being cold, no vomiting, focal weakness, LOC, chest pain, leg swelling, abdominal pain, dysuria, or vomiting.  Does report some  clear sputum which is pink-tinged at times.  Patient reported some SOB earlier today, since resolved.  No reports of obvious dysphagia or coughing episodes after eating but wife noted the other day, he held some [place] bacon in his mouth and kept chewing like he could not remember to swallow.    In ER, patient afebrile with initial BP 85/66, tachypnea and 90% on room air.  Labs noted for WBC 36.3, Hgb stable 13.8, Na 133, K 5.1, CL 97, glucose 114, BUN 39, sCr 1.5 (baseline  ~1.2 - 1.4), low albumin and protein, normal coags, SARS2/ flu negative, CXR consistent with multifocal pneumonia, CT abdomen/ pelvis showed  mass-like mural thickening of the distal rectum with associated mucosal hyperenhancement; suggest sigmoidoscopy to exclude possibility of infiltrative neoplasm.  Additionally noted for bilateral lower lobe consolidation, right greater than left, with small pleural effusion.  Blood cultures sent and given vancomycin, zosyn, and cefepime in ER.  He was given a 1L NS bolus with some improvement in hypotension, however ended up on peripheral levophed.  He was placed on nasal cannula however had acute increase in oxygen requirements to NRB.  Wife reports he ate on a boxed lunch in the ER and then got sicker.   He was admitted to [facility] however given vasopressor and oxygen requirements, PCCM consulted for further ICU management.    08/28/20- patient seems to have improved but he remains on HFNC 50L/m/90%. 08/31/20- patient with hemoptysis overnight and again this am. Have d/cd heparin. Discussed care plan with cardiology today.  He continues to be dyspenic  and require elevated settings on HFNC.  Family conference today - present is wife and 4 children as well as BIL and [place] - DNR comfort measures.    Past Medical History  Systolic and diastolic HF with EF 35-40% (06/28/2020) with LifeVest, moderate to severe AS, GERD, BPH, HLD, [facility]  Significant Hospital Events   11/15 Admitted   Consults:   Cardiology GI  Procedures:   Significant Diagnostic Tests:  11/15 CT a/p >> 1. Mass-like mural thickening of the distal rectum with some associated mucosal hyperenhancement. Correlation with sigmoidoscopy is suggested to exclude the possibility of infiltrative neoplasm. 2. Areas of airspace consolidation in the lower lobes of the lungs bilaterally (right greater than left), concerning for multilobar pneumonia. 3. Small right parapneumonic pleural effusion. 4. Cholelithiasis or biliary sludge lying dependently in the gallbladder, without evidence of acute cholecystitis at this time. 5. Colonic diverticulosis. 6. 2 mm nonobstructive calculus in the lower pole collecting system of the left kidney. 7. Aortic atherosclerosis. 8. Additional incidental findings, as above.  Micro Data:  11/15 SARS 2/ Flu >> neg 11/15 BCx2 >> 11/15 MRSA >>   Antimicrobials:  11/15 vanc 11/15 zosyn  11/15 cefepime 11/15 azithro >> 11/16 unasyn >>    CC Follow up shock  HPI More alert awake Pressors dosage improving Speech path assessment pending  Objective   Blood pressure 115/80, pulse 88, temperature 98.4 F (36.9 C), temperature source Axillary, resp. rate 18, height 6\' 1"  (1.854 m), weight 71 kg, SpO2 (!) 89 %.    FiO2 (%):  [60 %-90 %] 90 %   Intake/Output Summary (Last 24 hours) at 08/19/2020 1011 Last data filed at 08/14/2020 0800 Gross per 24 hour  Intake 1137.86 ml  Output 250 ml  Net 887.86 ml   Filed Weights   08/25/20 0500 08/28/20 0408 08/30/2020 0500  Weight: 70.1 kg 70.5 kg 71 kg    Review of Systems:  Gen:  Denies  fever, sweats, chills weight loss  Other:  All other systems negative    Physical Examination:   General Appearance: No distress  Neuro:without focal findings,  speech normal,  HEENT: PERRLA, EOM intact.   Pulmonary: normal breath sounds, No wheezing.  CardiovascularNormal S1,S2.  No m/r/g.   Abdomen: Benign, Soft, non-tender. PSYCHIATRIC: Mood,  affect within normal limits.   ALL OTHER ROS ARE NEGATIVE    Assessment & Plan:   Septic shock secondary to pneumonia Wean off pressors Sepsis with bilateral pneumonia  Continue IV abx- de-escalate due to comfort care Oxygen as needed    -possible PCP pneumonia -poor prognosis  -hemoptysis - non-massive.   Combined systolic and diastolic HF with EF 35-40% on 08/31/20 with LifeVest Moderate to severe AS Very poor prognosis  Failure to thrive/ protein calorie malnutrition, BMI 20.98 - on chart review-> weight 77.1 on 07/12/2020 to now 72.1    Recommend Palliative care consultation to address goals of care    Critical care provider statement:    Critical care time (minutes):  109   Critical care time was exclusive of:  Separately billable procedures and  treating other patients   Critical care was necessary to treat or prevent imminent or  life-threatening deterioration of the following conditions:  acute systolic CHF exacerbation , severe hypoxemia acutely, multiple comoribid conditions.    Critical care was time spent personally by me on the following  activities:  Development of treatment plan with patient or surrogate,  discussions with consultants, evaluation of patient's response to  treatment, examination of patient, obtaining history from patient or  surrogate, ordering and performing treatments and interventions, ordering  and review of laboratory studies and re-evaluation of patient's condition   I assumed direction of critical care for this patient from another  provider in my specialty: no     Vida Rigger, M.D.  Pulmonary & Critical Care Medicine  [facility]

## 2020-09-08 NOTE — Progress Notes (Signed)
ANTICOAGULATION CONSULT NOTE  Pharmacy Consult for heparin Indication: atrial fibrillation  Allergies  Allergen Reactions  . Horse-Derived Products Anaphylaxis    Patient Measurements: Height: 6\' 1"  (185.4 cm) Weight: 71 kg (156 lb 8.4 oz) IBW/kg (Calculated) : 79.9 Heparin Dosing Weight: 70 kg  Vital Signs: Temp: 98.4 F (36.9 C) (11/21 0400) Temp Source: Axillary (11/21 0400) BP: 99/74 (11/21 0700) Pulse Rate: 76 (11/21 0700)  Labs: Recent Labs    08/27/20 0416 08/27/20 2204 08/28/20 0705 08/28/20 1451 08/24/2020 0538  HGB 10.4*  --  10.3*  --  10.5*  HCT 31.8*  --  30.6*  --  32.0*  PLT 219  --  208  --  202  HEPARINUNFRC  --    < > 0.51 0.57 0.46  CREATININE  --   --  1.57*  --  1.78*   < > = values in this interval not displayed.    Estimated Creatinine Clearance: 26 mL/min (A) (by C-G formula based on SCr of 1.78 mg/dL (H)).   Medical History: Past Medical History:  Diagnosis Date  . Arthritis   . Benign head tremor   . Carotid artery occlusion    right side   . Dysrhythmia    irregular heart beat per patient- skips a beat   . Enlarged prostate   . GERD (gastroesophageal reflux disease)   . Hard of hearing   . Hyperlipidemia   . Sore throat     Assessment: 86 year old male who presented with concern for bloody diarrhea, hypotensive on admission. Patient is being treated for respiratory failure s/t pneumonia and HF. Patient started on furosemide drip to help with diuresis. Patient with afib with RVR, Cardiology started on digoxin. Continued to have issues with afib and was started on amiodarone drip. Cardiology consult for heparin drip with persistent afib. No further concerns for GIB.  11/20 0705 HL 0.51  11/20 1451 HL 0.57 11/21 0538 HL 0.46   Goal of Therapy:  Heparin level 0.3-0.7 units/ml Monitor platelets by anticoagulation protocol: Yes    Plan:  - 1121 @ 0538 HL 0.46. Level remains therapeutic - will continue heparin infusion at 1250  units/hr  Recheck heparin level in AM - CBC daily while on heparin  12/21, PharmD, BCPS Clinical Pharmacist 08/21/2020 8:21 AM

## 2020-09-08 NOTE — Progress Notes (Signed)
SUBJECTIVE: Patient resting In bed. Much more flat affect and less verbal than previously. Patient denies chest pain or shortness of breath but is currently requiring 90% FiO2 via HFNC.    Vitals:   09-18-2020 0500 Sep 18, 2020 0600 09/18/2020 0700 09-18-20 0900  BP: 103/75 110/83 99/74 115/80  Pulse: 87 90 76 88  Resp: 13 17 13 18   Temp:      TempSrc:      SpO2: 94% 94% 96% (!) 89%  Weight: 71 kg     Height:        Intake/Output Summary (Last 24 hours) at Sep 18, 2020 1026 Last data filed at Sep 18, 2020 0800 Gross per 24 hour  Intake 1137.86 ml  Output 250 ml  Net 887.86 ml    LABS: Basic Metabolic Panel: Recent Labs    08/27/20 0416 08/28/20 0705 09/18/2020 0538 09/18/2020 0705  NA  --  139 139  --   K  --  3.1* 3.3*  --   CL  --  99 101  --   CO2  --  30 29  --   GLUCOSE  --  141* 136*  --   BUN  --  38* 49*  --   CREATININE  --  1.57* 1.78*  --   CALCIUM  --  9.8 9.8  --   MG 2.2  --   --  2.4  PHOS 3.7  --   --   --    Liver Function Tests: No results for input(s): AST, ALT, ALKPHOS, BILITOT, PROT, ALBUMIN in the last 72 hours. No results for input(s): LIPASE, AMYLASE in the last 72 hours. CBC: Recent Labs    08/27/20 0416 08/27/20 0416 08/28/20 0705 09-18-20 0538  WBC 13.7*   < > 12.3* 16.8*  NEUTROABS 12.3*  --   --   --   HGB 10.4*   < > 10.3* 10.5*  HCT 31.8*   < > 30.6* 32.0*  MCV 93.5   < > 92.7 93.3  PLT 219   < > 208 202   < > = values in this interval not displayed.   Cardiac Enzymes: No results for input(s): CKTOTAL, CKMB, CKMBINDEX, TROPONINI in the last 72 hours. BNP: Invalid input(s): POCBNP D-Dimer: No results for input(s): DDIMER in the last 72 hours. Hemoglobin A1C: No results for input(s): HGBA1C in the last 72 hours. Fasting Lipid Panel: No results for input(s): CHOL, HDL, LDLCALC, TRIG, CHOLHDL, LDLDIRECT in the last 72 hours. Thyroid Function Tests: No results for input(s): TSH, T4TOTAL, T3FREE, THYROIDAB in the last 72  hours.  Invalid input(s): FREET3 Anemia Panel: No results for input(s): VITAMINB12, FOLATE, FERRITIN, TIBC, IRON, RETICCTPCT in the last 72 hours.   PHYSICAL EXAM General: Malnourished HEENT:  Normocephalic and atramatic Neck:  No JVD.  Lungs: Scattered rhonchi Heart: Atrial fibrillation. Abdomen: Bowel sounds are positive, abdomen soft and non-tender  Msk:  Back normal, normal gait. Normal strength and tone for age. Extremities: No clubbing, cyanosis or edema.   Neuro: Alert and oriented X 2 Psych:  Flat Affect. Limited verbalization today  TELEMETRY: Atrial fibrillation with RBBB. 92/bpm  ASSESSMENT AND PLAN: Patient has had complicated admission involving pneumonia sepsis, HFrEF exacerbation, and atrial fibrillation with RVR. Patient remains in atrial fibrillation currently and recommend continuing Amio infusion and digoxin 0.250mg  daily. Heparin infusion on hold as patient has developed worsening sanguineous sputum. BP stable, continue low dose BB. Patient is having worsening oxygenation status, now requiring 90% O2 via HFNC. Discussed plan of  care with intensivist and agree that patient is not clinically improving and may have worsening PNA as compared to HFrEF exacerbation. With patients current clinical status, with comorbidities of HFrEF, severe AS and MR, and CAD patient has very poor prognosis. I plan to be a part of a goals of care conversion later today. We will continue to follow.  Principal Problem:   Severe sepsis with septic shock (HCC) Active Problems:   Chronic kidney disease, stage 3a (HCC)   Rectal bleeding   Acute on chronic combined systolic and diastolic CHF (congestive heart failure) (HCC)   Acute hypoxemic respiratory failure (HCC)   Goals of care, counseling/discussion   Palliative care by specialist   Advanced care planning/counseling discussion   Failure to thrive in adult   Protein-calorie malnutrition, severe   Aspiration pneumonia of both lower lobes  due to gastric secretions (HCC)   Hyponatremia    Maryelizabeth Kaufmann, NP-C 09/04/2020 10:26 AM

## 2020-09-08 DEATH — deceased

## 2020-11-23 IMAGING — DX DG ABD PORTABLE 1V
1 series · 1 of 1 positions shown · non-contrast
Comparison: July 02, 2020.

CLINICAL DATA: Constipation

EXAM:
PORTABLE ABDOMEN - 1 VIEW

[abdomen supine]
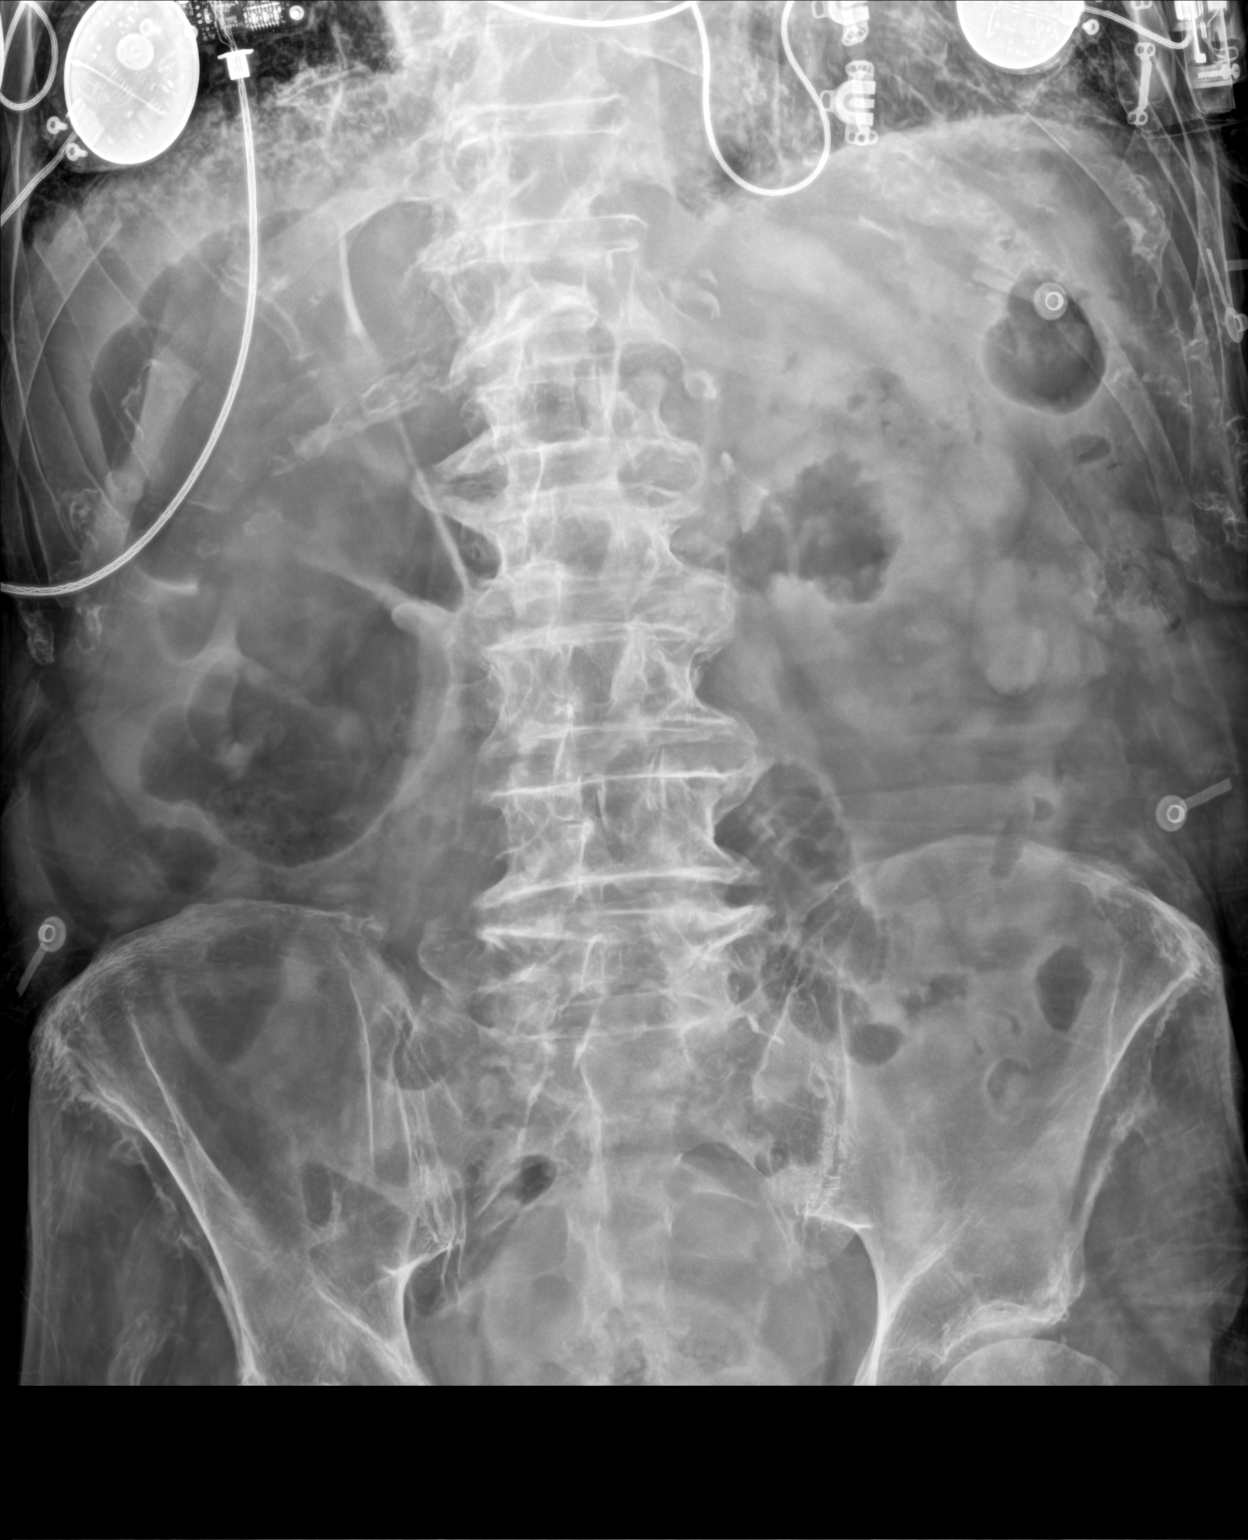

[1 of 1 positions shown; findings below may reference images not displayed]

FINDINGS: Air and stool-filled nondilated loops of bowel. Moderate colonic
stool burden predominately in the LEFT hemicolon. Mild distension of
loops of bowel in the RIGHT hemiabdomen. Reticulation of bilateral
lung bases likely reflecting underlying interstitial lung disease
versus pulmonary edema. Degenerative changes of the lumbar spine.
Prominent bladder silhouette.
IMPRESSION: 1. Moderate colonic stool burden predominately in the LEFT
hemicolon.
2. Nonobstructive bowel gas pattern.
3. Reticulation of bilateral lung bases likely reflecting underlying
interstitial lung disease versus pulmonary edema.

## 2020-11-25 IMAGING — DX DG CHEST 1V PORT
1 series · 2 of 2 positions shown · non-contrast
Comparison: July 12, 2020

CLINICAL DATA: Rectal bleeding and diarrhea. Concern for potential
sepsis

EXAM:
PORTABLE CHEST 1 VIEW

[Series 1: chest ap · 0.14mm/px · 2 of 2 slices shown]
[im 1/2]
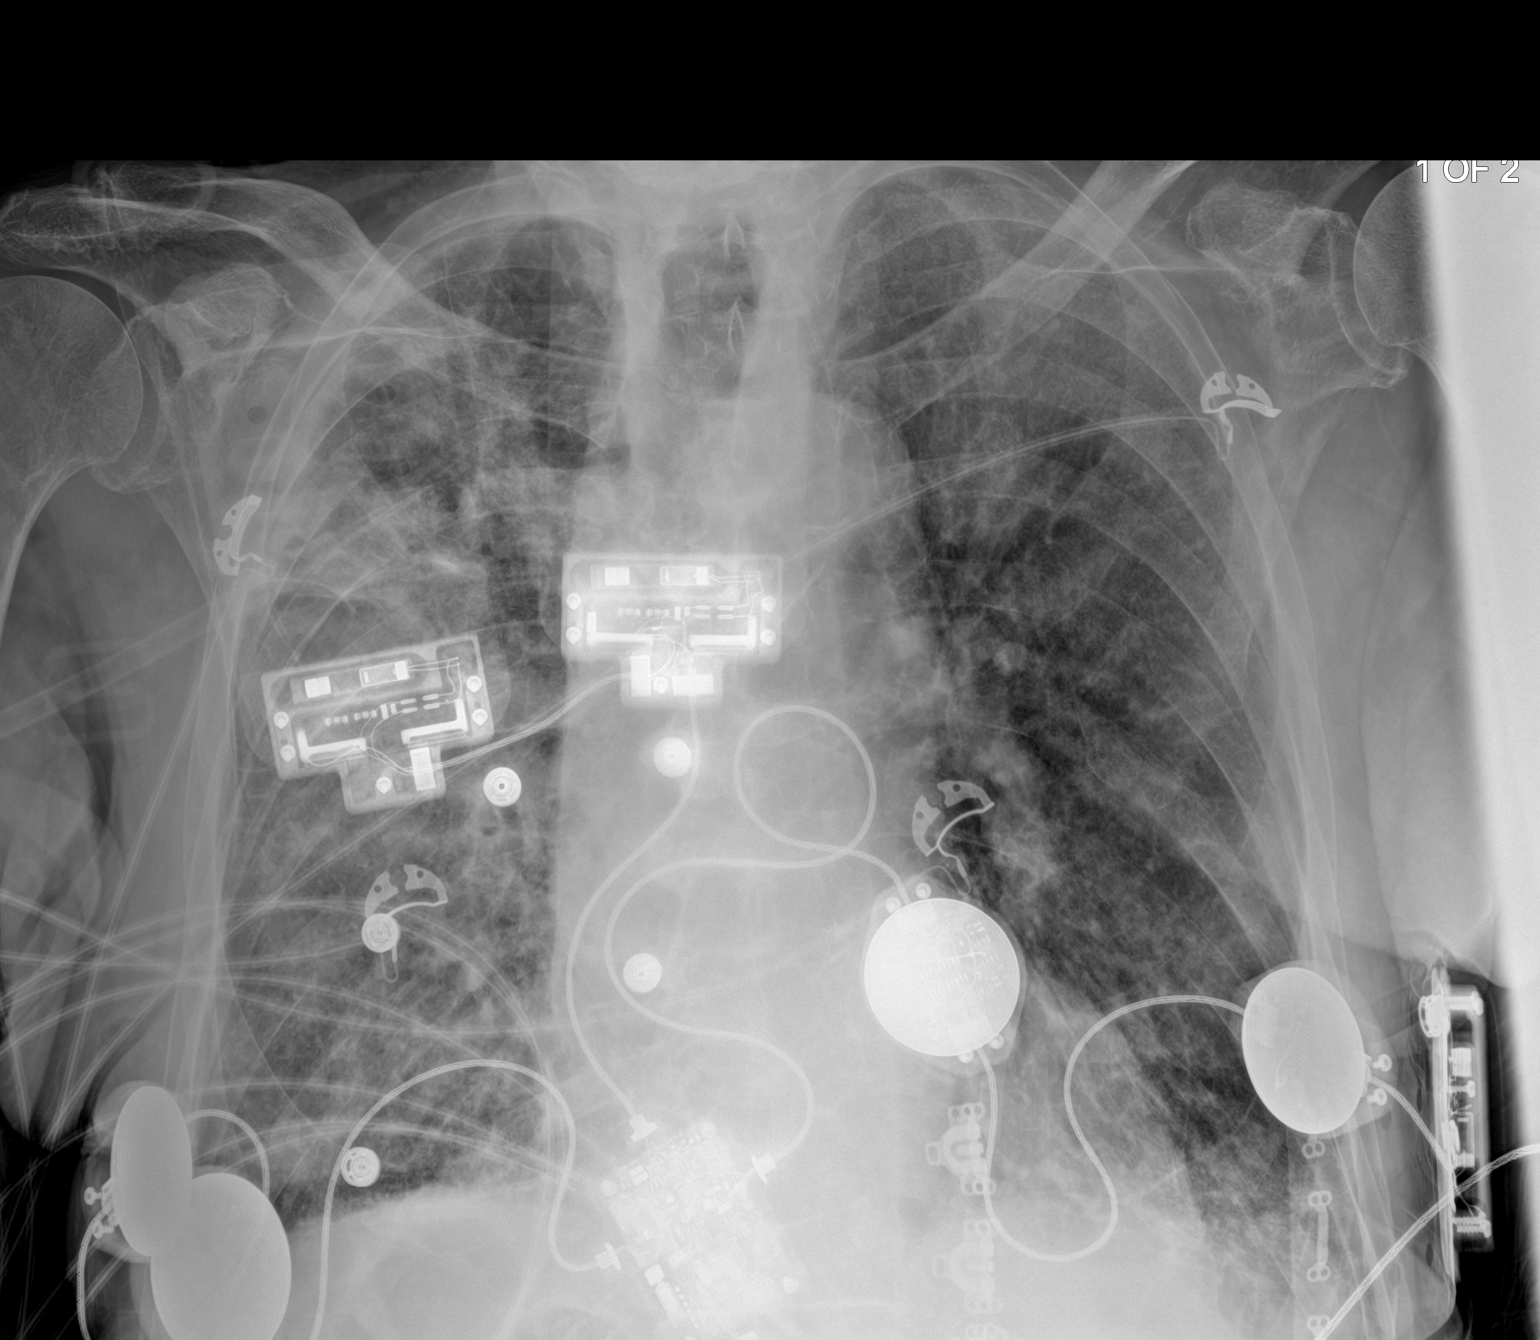
[im 2/2]
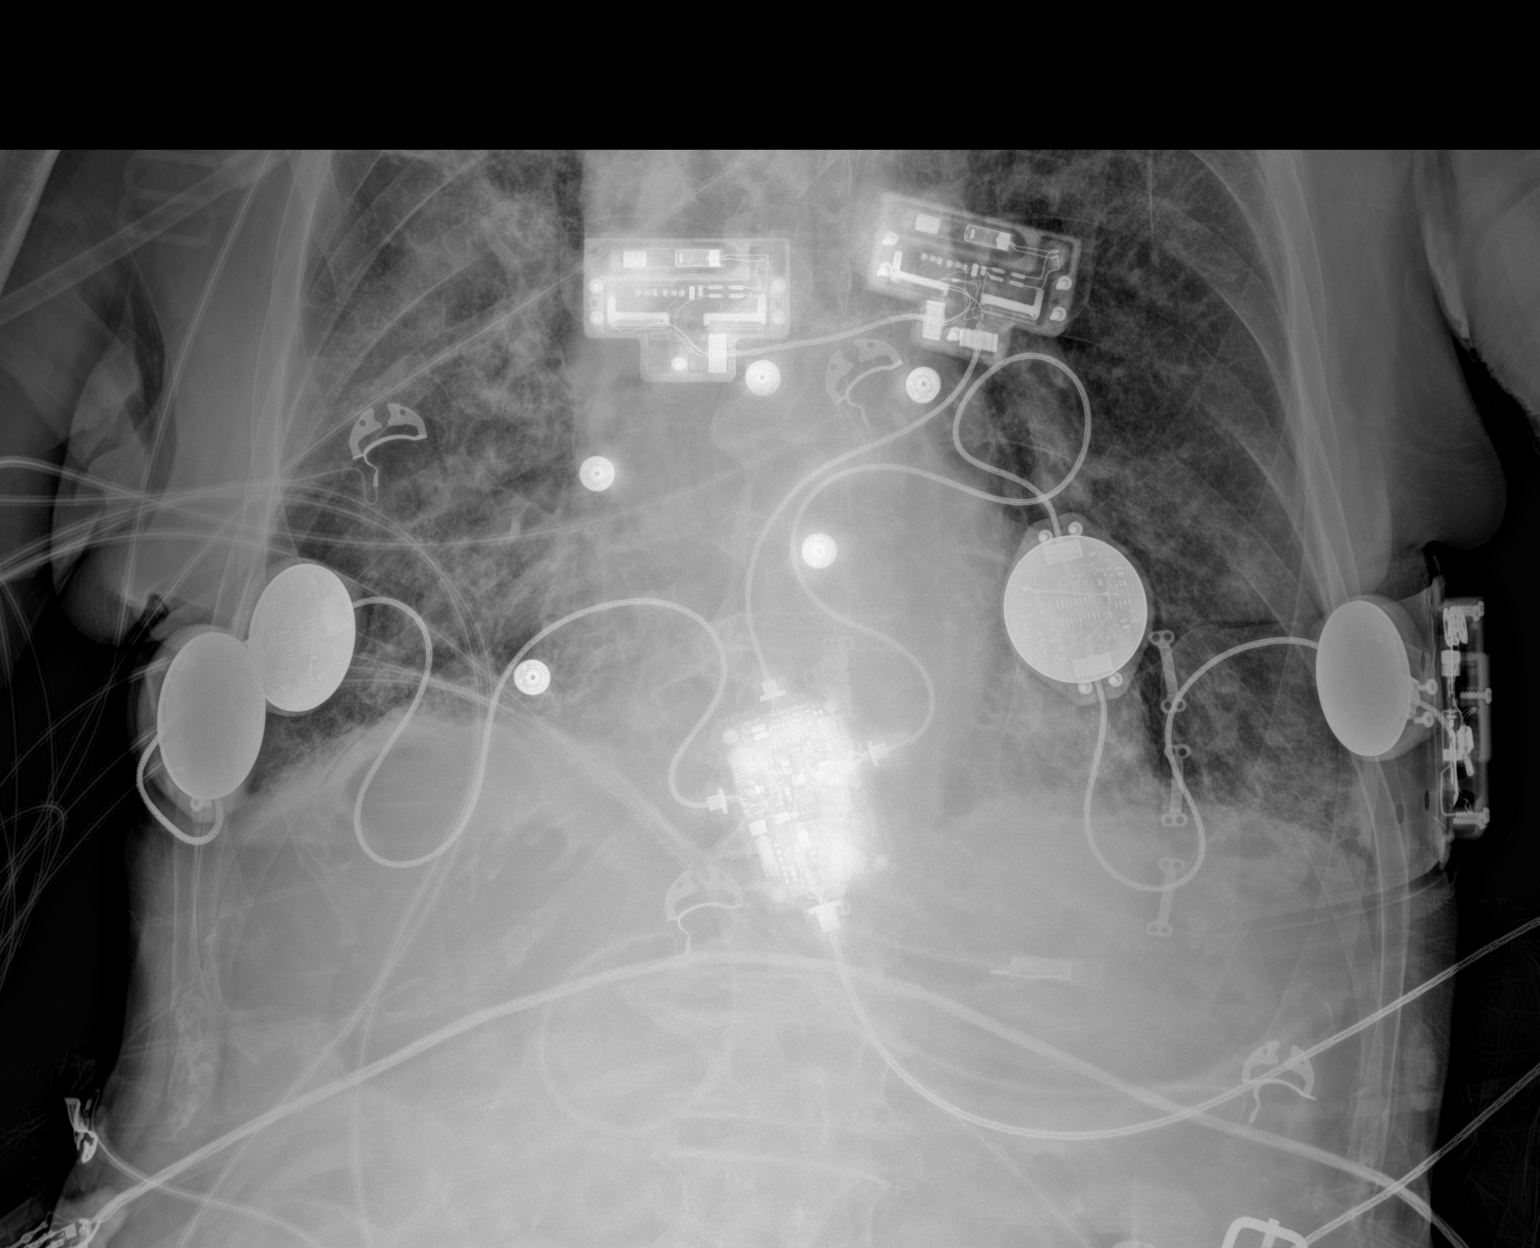

[2 of 2 positions shown; findings below may reference images not displayed]

FINDINGS: There is airspace opacity throughout multiple sites in the right
lung. There is underlying hyperexpansion, stable. There is diffuse
interstitial thickening throughout the lungs, stable. Heart size and
pulmonary vascular normal. No adenopathy. There is aortic
atherosclerosis. No bone lesions.
IMPRESSION: Multifocal areas of airspace opacity consistent with multifocal
pneumonia throughout right lung. Underlying interstitial fibrosis
and mild hyperexpansion. Heart size normal. No adenopathy evident.

Aortic Atherosclerosis ([id number]).

## 2020-11-26 IMAGING — DX DG CHEST 1V PORT
1 series · 1 of 1 positions shown · non-contrast
Comparison: 08/23/2020.  07/12/2020.

CLINICAL DATA: Respiratory failure.  Hypoxia.

EXAM:
PORTABLE CHEST 1 VIEW

[chest ap]
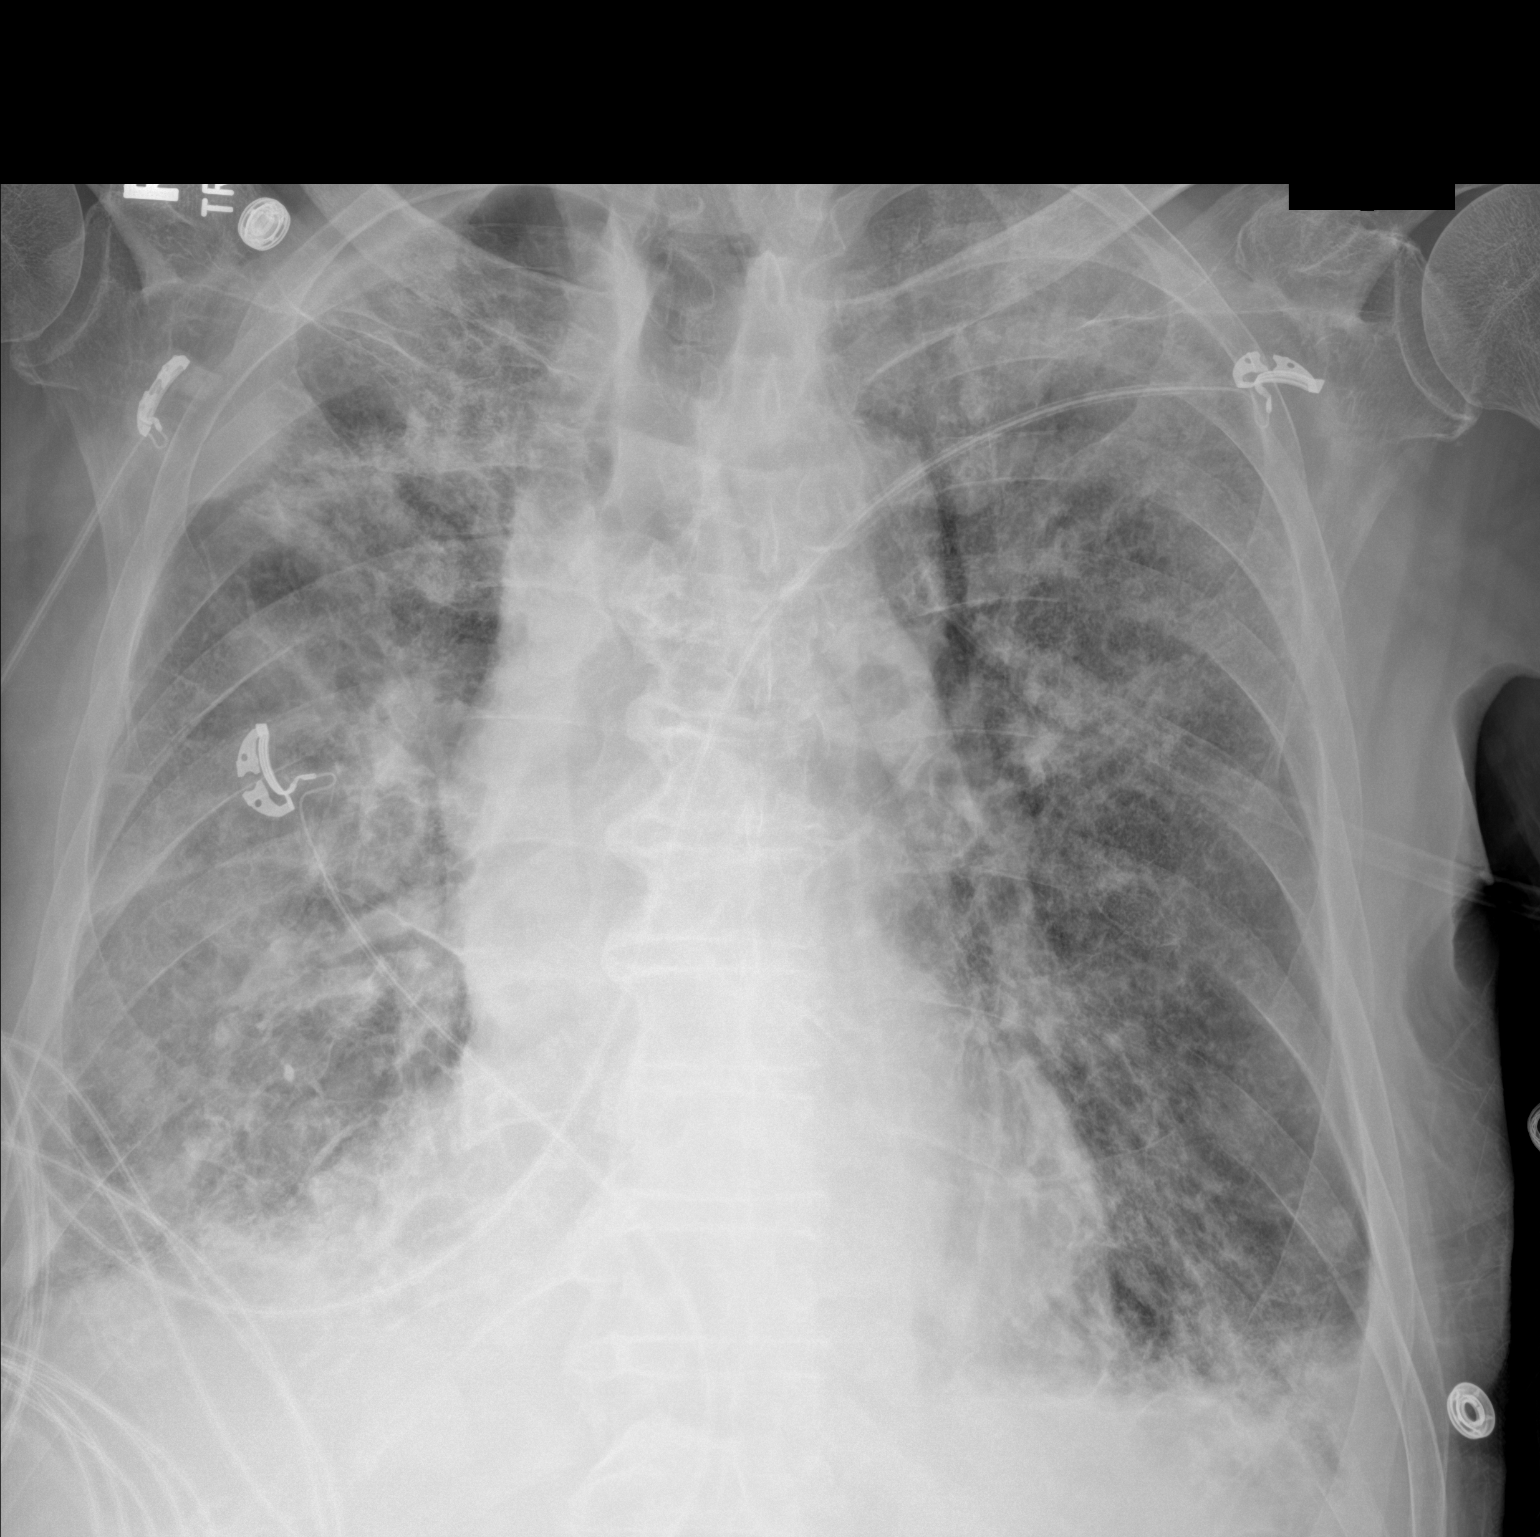

[1 of 1 positions shown; findings below may reference images not displayed]

FINDINGS: Stable cardiomegaly. Diffuse severe bilateral pulmonary infiltrates
again noted without interim change. Small wedge-shaped densities
noted over the right upper and left lower lungs. Although these
changes may be secondary to atelectasis, pulmonary infarcts cannot
be excluded. No prominent pleural effusion. No pneumothorax.
Degenerative change thoracic spine.
IMPRESSION: 1. Diffuse severe bilateral pulmonary infiltrates again noted
without interim change.
2. Small wedge-shaped densities noted over the right upper and left
lower lungs. Although these changes may be secondary to atelectasis,
pulmonary infarcts cannot be excluded.
3.  Stable cardiomegaly.

## 2020-11-29 IMAGING — DX DG CHEST 1V PORT
1 series · 2 of 2 positions shown · non-contrast
Comparison: 08/24/2020.

CLINICAL DATA: Acute respiratory failure.

EXAM:
PORTABLE CHEST 1 VIEW

[Series 1: chest ap · 0.14mm/px · 2 of 2 slices shown]
[im 1/2]
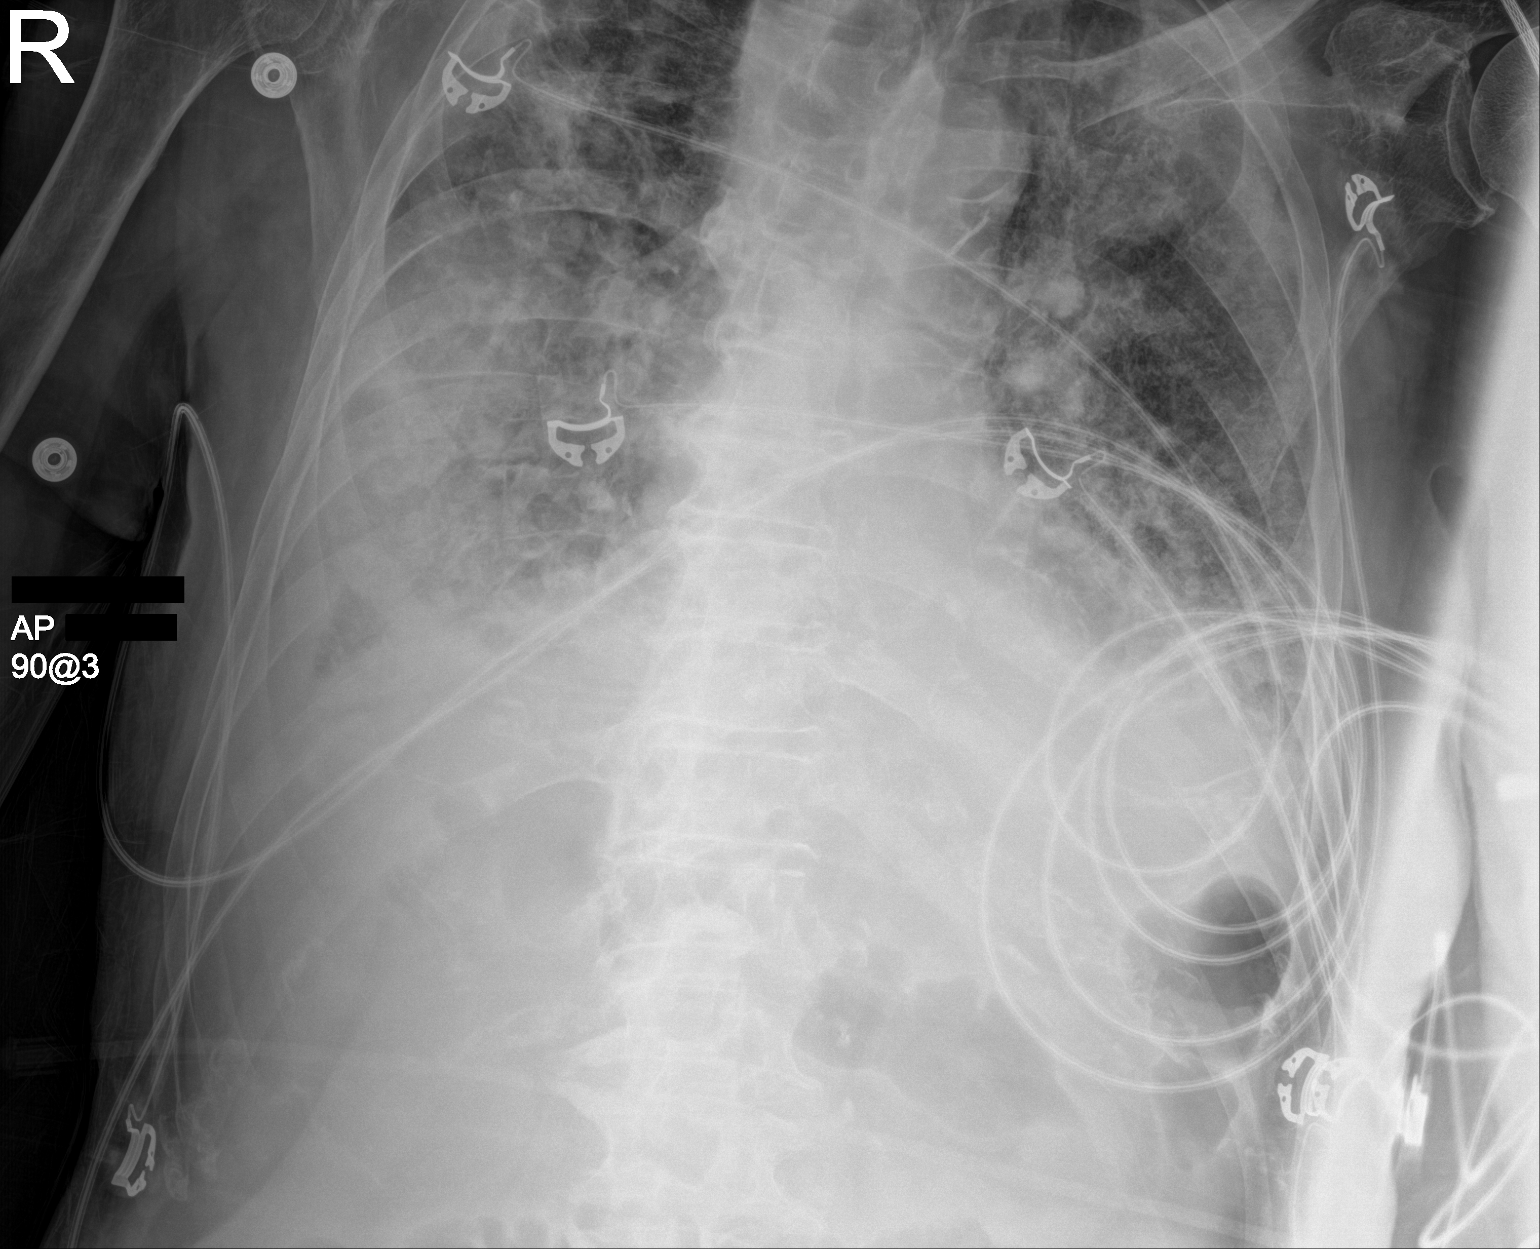
[im 2/2]
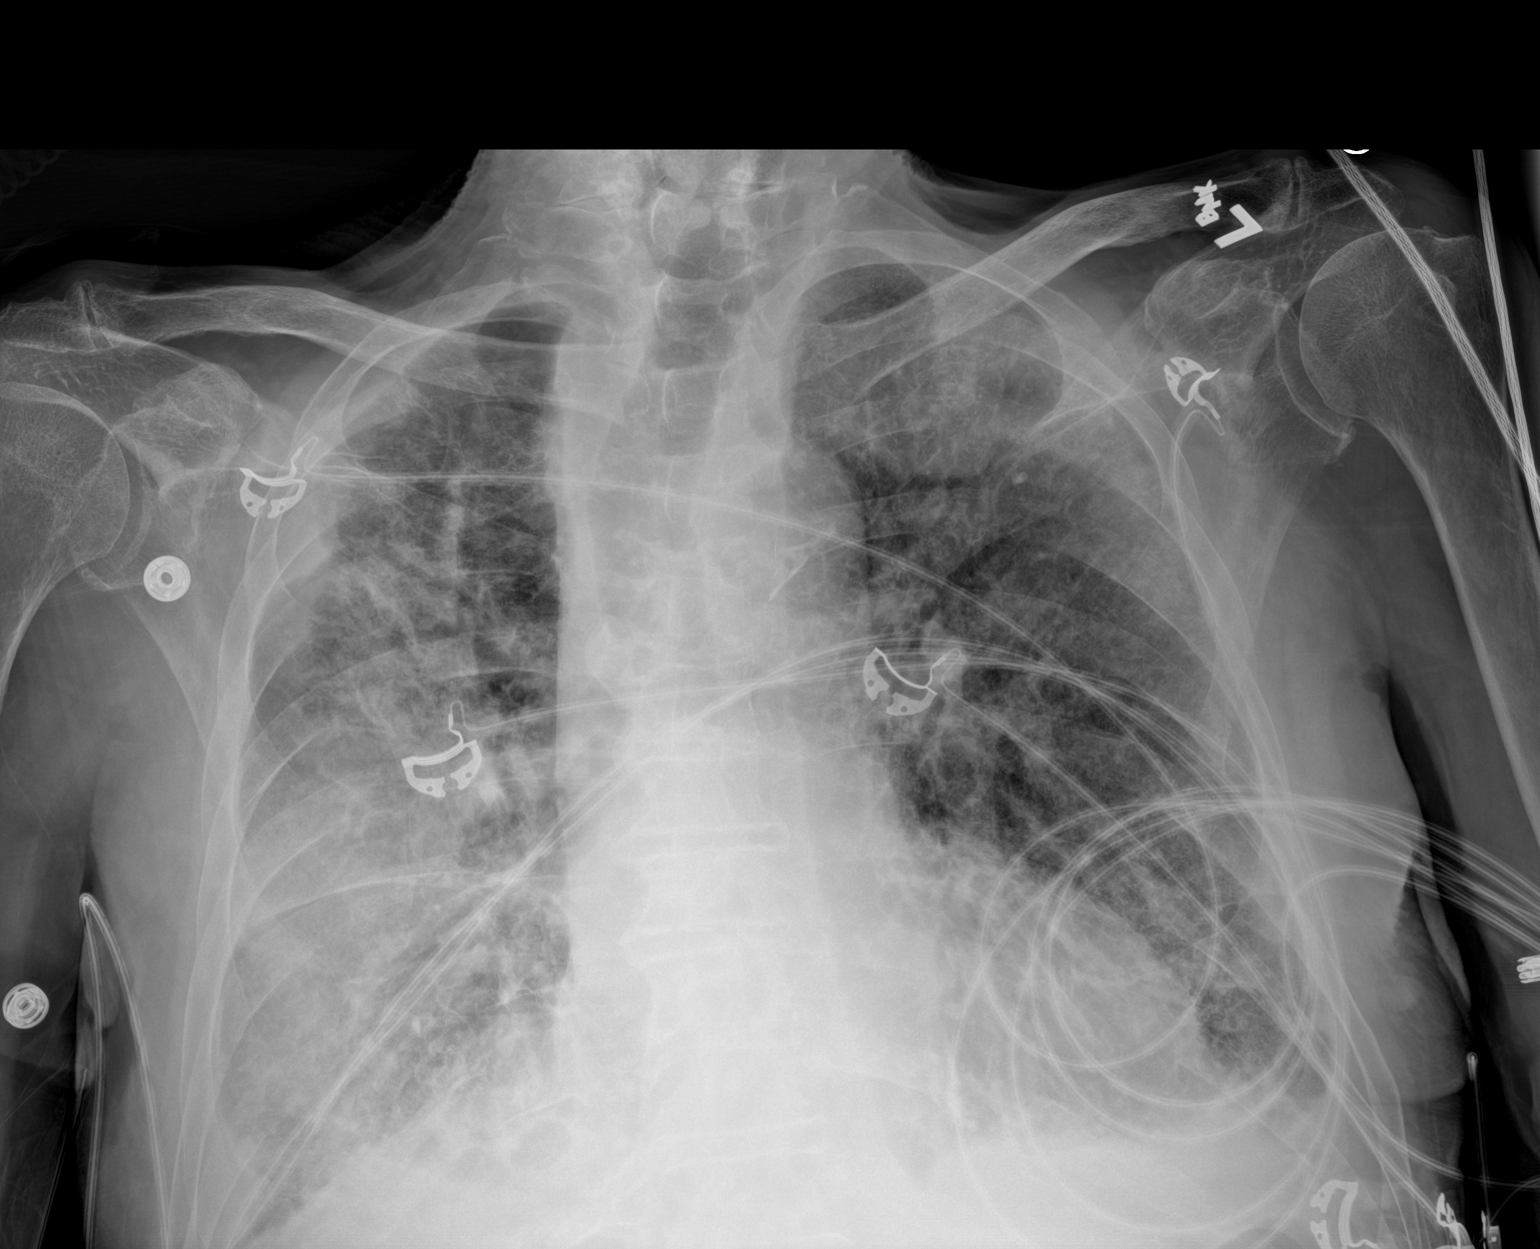

[2 of 2 positions shown; findings below may reference images not displayed]

FINDINGS: Heart size stable. Diffuse bilateral pulmonary infiltrates/edema
with interim progression on the right. Small bilateral pleural
effusions again noted. Small wedge-shaped density in the right upper
chest again noted. This may be secondary to atelectasis and or
pleural thickening. This is less prominent than noted on prior exam.
Previously identified small wedge-shaped density left base not
visualized on today's exam.
IMPRESSION: 1. Diffuse bilateral pulmonary infiltrates/edema with interim
progression on the right. Small bilateral pleural effusions again
noted.
2. Small wedge-shaped density in the right upper chest again noted.
This may be secondary to atelectasis and or pleural thickening. This
is less prominent than noted on prior exam. Previously identified
small wedge-shaped density left base not visualized on today's exam.

## 2020-12-01 IMAGING — DX DG CHEST 1V PORT
1 series · 1 of 1 positions shown · non-contrast
Comparison: 08/27/2020

CLINICAL DATA: Acute respiratory failure with hypoxia.

EXAM:
PORTABLE CHEST 1 VIEW

[chest ap]
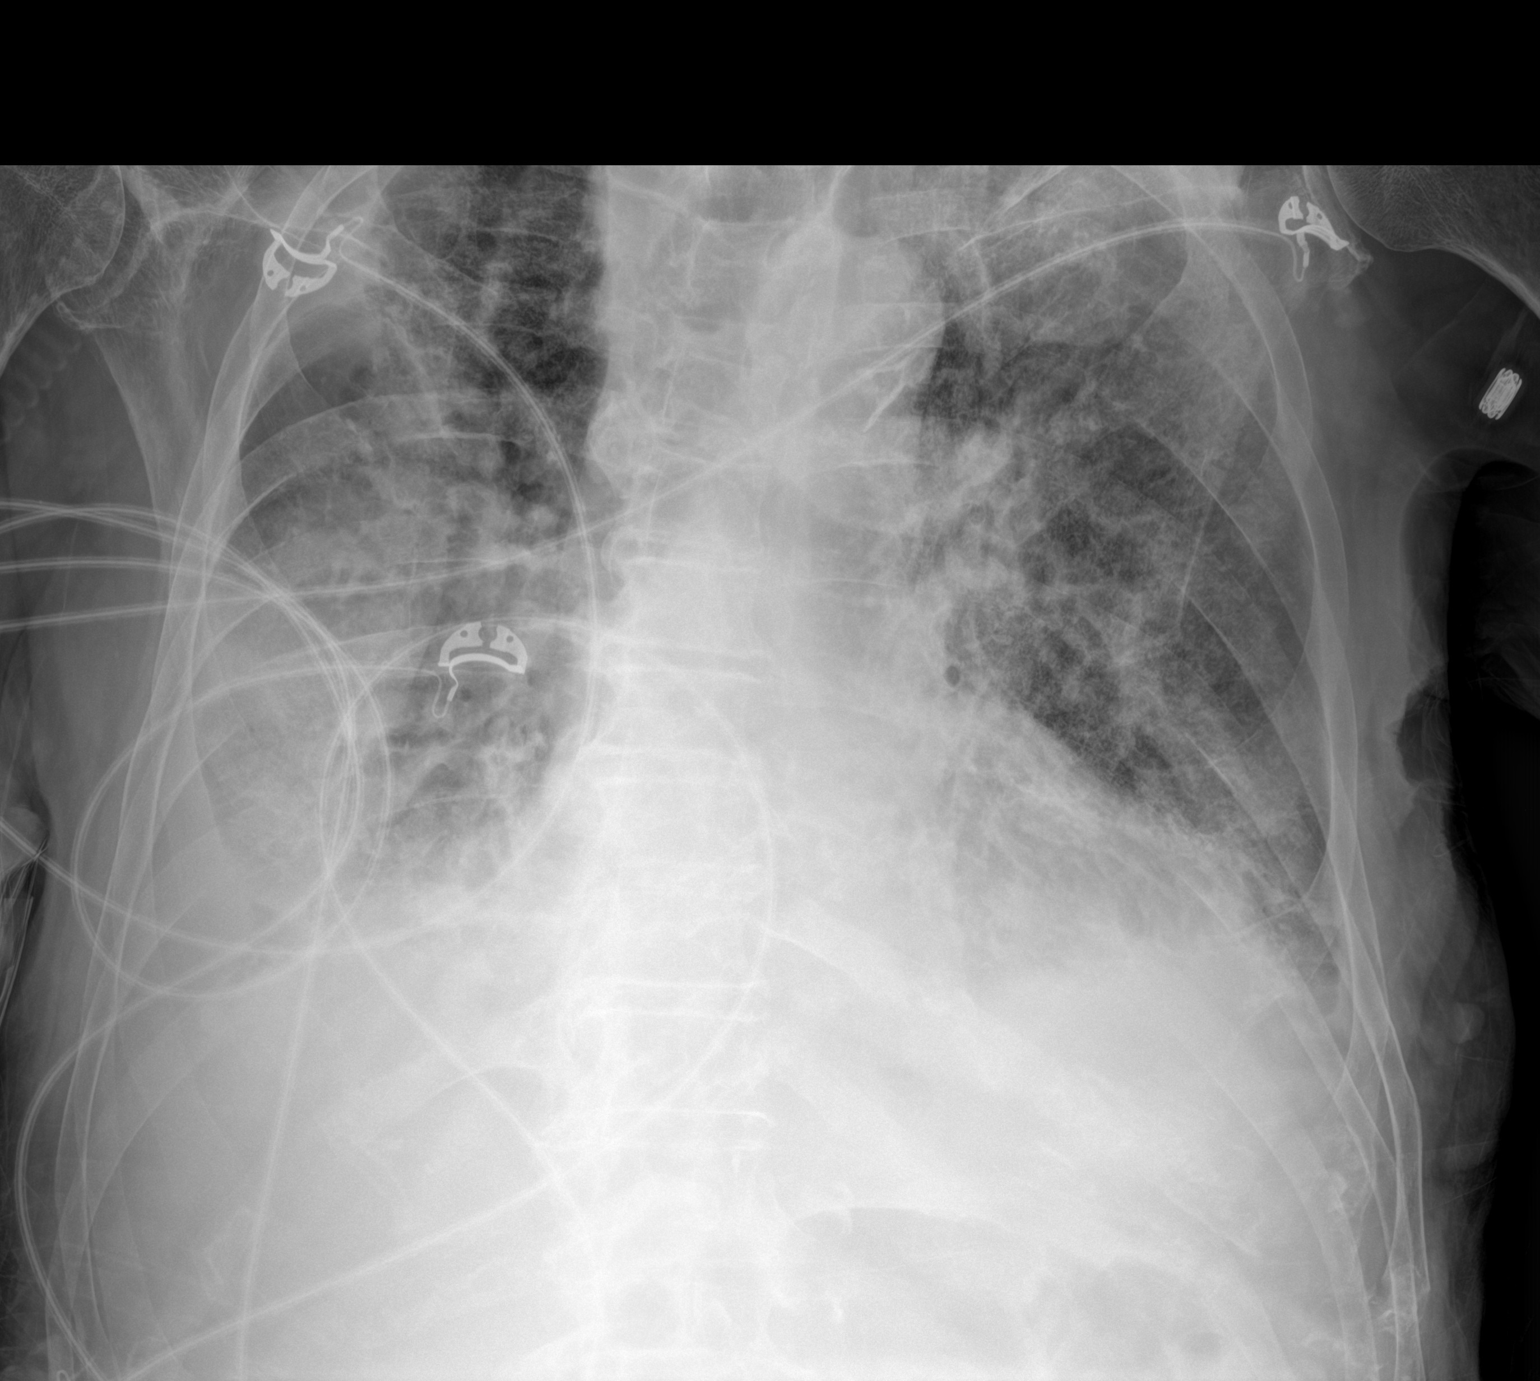

[1 of 1 positions shown; findings below may reference images not displayed]

FINDINGS: Patient is slightly rotated to the left. Lungs are adequately
inflated demonstrate continued moderate bilateral patchy airspace
process without significant change. Findings suggesting small right
pleural effusion without significant change. Cardiomediastinal
silhouette and remainder of the exam is unchanged.
IMPRESSION: Stable bilateral patchy airspace process likely multifocal
pneumonia. Stable small right pleural effusion.
